# Patient Record
Sex: Female | Born: 1935 | Race: White | Hispanic: No | Marital: Married | Smoking: Former smoker
Health system: Southern US, Community
[De-identification: ages and names within clinical notes are randomized; demographics above are authoritative.]

## PROBLEM LIST (undated history)

## (undated) DIAGNOSIS — M069 Rheumatoid arthritis, unspecified: Secondary | ICD-10-CM

## (undated) DIAGNOSIS — F32A Depression, unspecified: Secondary | ICD-10-CM

## (undated) DIAGNOSIS — K3184 Gastroparesis: Secondary | ICD-10-CM

## (undated) DIAGNOSIS — C50919 Malignant neoplasm of unspecified site of unspecified female breast: Secondary | ICD-10-CM

## (undated) DIAGNOSIS — I499 Cardiac arrhythmia, unspecified: Secondary | ICD-10-CM

## (undated) DIAGNOSIS — I609 Nontraumatic subarachnoid hemorrhage, unspecified: Secondary | ICD-10-CM

## (undated) DIAGNOSIS — M810 Age-related osteoporosis without current pathological fracture: Secondary | ICD-10-CM

## (undated) DIAGNOSIS — H8109 Meniere's disease, unspecified ear: Secondary | ICD-10-CM

## (undated) DIAGNOSIS — D696 Thrombocytopenia, unspecified: Secondary | ICD-10-CM

## (undated) DIAGNOSIS — I619 Nontraumatic intracerebral hemorrhage, unspecified: Secondary | ICD-10-CM

## (undated) DIAGNOSIS — C189 Malignant neoplasm of colon, unspecified: Secondary | ICD-10-CM

## (undated) DIAGNOSIS — M1990 Unspecified osteoarthritis, unspecified site: Secondary | ICD-10-CM

## (undated) DIAGNOSIS — N952 Postmenopausal atrophic vaginitis: Secondary | ICD-10-CM

## (undated) DIAGNOSIS — I2699 Other pulmonary embolism without acute cor pulmonale: Secondary | ICD-10-CM

## (undated) DIAGNOSIS — R251 Tremor, unspecified: Secondary | ICD-10-CM

## (undated) DIAGNOSIS — I890 Lymphedema, not elsewhere classified: Secondary | ICD-10-CM

## (undated) DIAGNOSIS — I10 Essential (primary) hypertension: Secondary | ICD-10-CM

## (undated) DIAGNOSIS — R198 Other specified symptoms and signs involving the digestive system and abdomen: Secondary | ICD-10-CM

## (undated) DIAGNOSIS — G8110 Spastic hemiplegia affecting unspecified side: Secondary | ICD-10-CM

## (undated) DIAGNOSIS — I639 Cerebral infarction, unspecified: Secondary | ICD-10-CM

## (undated) DIAGNOSIS — E039 Hypothyroidism, unspecified: Secondary | ICD-10-CM

## (undated) DIAGNOSIS — F329 Major depressive disorder, single episode, unspecified: Secondary | ICD-10-CM

## (undated) DIAGNOSIS — E042 Nontoxic multinodular goiter: Secondary | ICD-10-CM

## (undated) HISTORY — PX: BREAST SURGERY: SHX581

## (undated) HISTORY — PX: MASTECTOMY: SHX3

## (undated) HISTORY — PX: NASAL SINUS SURGERY: SHX719

## (undated) HISTORY — DX: Postmenopausal atrophic vaginitis: N95.2

## (undated) HISTORY — DX: Gastroparesis: K31.84

## (undated) HISTORY — PX: TOTAL HIP ARTHROPLASTY: SHX124

## (undated) HISTORY — DX: Nontraumatic intracerebral hemorrhage, unspecified: I61.9

## (undated) HISTORY — DX: Nontraumatic subarachnoid hemorrhage, unspecified: I60.9

## (undated) HISTORY — PX: THORACIC OUTLET SURGERY: SHX2502

## (undated) HISTORY — DX: Other pulmonary embolism without acute cor pulmonale: I26.99

## (undated) HISTORY — PX: LUMBAR LAMINECTOMY: SHX95

## (undated) HISTORY — PX: BUNIONECTOMY: SHX129

## (undated) HISTORY — PX: JOINT REPLACEMENT: SHX530

## (undated) HISTORY — PX: CARPAL TUNNEL RELEASE: SHX101

## (undated) HISTORY — PX: CERVICAL LAMINECTOMY: SHX94

## (undated) HISTORY — PX: OTHER SURGICAL HISTORY: SHX169

## (undated) HISTORY — DX: Cerebral infarction, unspecified: I63.9

## (undated) HISTORY — PX: SHOULDER ARTHROSCOPY: SHX128

## (undated) HISTORY — DX: Nontoxic multinodular goiter: E04.2

## (undated) HISTORY — DX: Meniere's disease, unspecified ear: H81.09

## (undated) HISTORY — PX: ULNAR NERVE TRANSPOSITION: SHX2595

## (undated) HISTORY — PX: CHOLECYSTECTOMY: SHX55

## (undated) HISTORY — DX: Spastic hemiplegia affecting unspecified side: G81.10

## (undated) HISTORY — DX: Age-related osteoporosis without current pathological fracture: M81.0

## (undated) HISTORY — DX: Lymphedema, not elsewhere classified: I89.0

## (undated) HISTORY — PX: TONSILLECTOMY: SUR1361

## (undated) HISTORY — PX: THYROIDECTOMY: SHX17

## (undated) HISTORY — DX: Thrombocytopenia, unspecified: D69.6

## (undated) HISTORY — PX: KNEE SURGERY: SHX244

---

## 1997-12-26 ENCOUNTER — Inpatient Hospital Stay (HOSPITAL_COMMUNITY): Admission: EM | Admit: 1997-12-26 | Discharge: 1997-12-29 | Payer: Self-pay | Admitting: Emergency Medicine

## 1998-02-14 ENCOUNTER — Inpatient Hospital Stay (HOSPITAL_COMMUNITY): Admission: EM | Admit: 1998-02-14 | Discharge: 1998-02-17 | Payer: Self-pay | Admitting: Emergency Medicine

## 1998-03-19 ENCOUNTER — Other Ambulatory Visit: Admission: RE | Admit: 1998-03-19 | Discharge: 1998-03-19 | Payer: Self-pay | Admitting: *Deleted

## 1998-09-30 ENCOUNTER — Encounter: Payer: Self-pay | Admitting: Internal Medicine

## 1998-09-30 ENCOUNTER — Inpatient Hospital Stay (HOSPITAL_COMMUNITY): Admission: AD | Admit: 1998-09-30 | Discharge: 1998-10-05 | Payer: Self-pay | Admitting: Internal Medicine

## 1998-11-19 ENCOUNTER — Encounter: Payer: Self-pay | Admitting: Orthopedic Surgery

## 1998-11-22 ENCOUNTER — Inpatient Hospital Stay (HOSPITAL_COMMUNITY): Admission: RE | Admit: 1998-11-22 | Discharge: 1998-11-26 | Payer: Self-pay | Admitting: Orthopedic Surgery

## 1999-03-16 ENCOUNTER — Inpatient Hospital Stay (HOSPITAL_COMMUNITY): Admission: RE | Admit: 1999-03-16 | Discharge: 1999-03-21 | Payer: Self-pay | Admitting: Orthopedic Surgery

## 1999-03-16 ENCOUNTER — Encounter: Payer: Self-pay | Admitting: Orthopedic Surgery

## 1999-04-20 ENCOUNTER — Other Ambulatory Visit: Admission: RE | Admit: 1999-04-20 | Discharge: 1999-04-20 | Payer: Self-pay | Admitting: *Deleted

## 2000-02-09 ENCOUNTER — Inpatient Hospital Stay (HOSPITAL_COMMUNITY): Admission: EM | Admit: 2000-02-09 | Discharge: 2000-02-13 | Payer: Self-pay | Admitting: Emergency Medicine

## 2000-02-10 ENCOUNTER — Encounter: Payer: Self-pay | Admitting: Internal Medicine

## 2000-03-14 ENCOUNTER — Encounter: Payer: Self-pay | Admitting: Orthopedic Surgery

## 2000-03-14 ENCOUNTER — Encounter: Admission: RE | Admit: 2000-03-14 | Discharge: 2000-03-14 | Payer: Self-pay | Admitting: Orthopedic Surgery

## 2000-03-23 ENCOUNTER — Other Ambulatory Visit: Admission: RE | Admit: 2000-03-23 | Discharge: 2000-03-23 | Payer: Self-pay | Admitting: *Deleted

## 2000-05-08 DIAGNOSIS — D696 Thrombocytopenia, unspecified: Secondary | ICD-10-CM

## 2000-05-08 DIAGNOSIS — H8109 Meniere's disease, unspecified ear: Secondary | ICD-10-CM

## 2000-05-08 HISTORY — DX: Meniere's disease, unspecified ear: H81.09

## 2000-05-08 HISTORY — DX: Thrombocytopenia, unspecified: D69.6

## 2000-06-12 ENCOUNTER — Encounter: Admission: RE | Admit: 2000-06-12 | Discharge: 2000-09-10 | Payer: Self-pay | Admitting: Radiation Oncology

## 2000-07-16 ENCOUNTER — Encounter: Payer: Self-pay | Admitting: Orthopedic Surgery

## 2000-07-16 ENCOUNTER — Inpatient Hospital Stay (HOSPITAL_COMMUNITY): Admission: RE | Admit: 2000-07-16 | Discharge: 2000-07-20 | Payer: Self-pay | Admitting: Orthopedic Surgery

## 2001-03-25 ENCOUNTER — Other Ambulatory Visit: Admission: RE | Admit: 2001-03-25 | Discharge: 2001-03-25 | Payer: Self-pay | Admitting: *Deleted

## 2001-04-22 ENCOUNTER — Inpatient Hospital Stay (HOSPITAL_COMMUNITY): Admission: EM | Admit: 2001-04-22 | Discharge: 2001-05-03 | Payer: Self-pay | Admitting: Emergency Medicine

## 2001-04-23 ENCOUNTER — Encounter: Payer: Self-pay | Admitting: Internal Medicine

## 2001-04-26 ENCOUNTER — Encounter: Payer: Self-pay | Admitting: Internal Medicine

## 2001-04-27 ENCOUNTER — Encounter: Payer: Self-pay | Admitting: Internal Medicine

## 2001-04-29 ENCOUNTER — Encounter: Payer: Self-pay | Admitting: Internal Medicine

## 2001-04-30 ENCOUNTER — Encounter: Payer: Self-pay | Admitting: Internal Medicine

## 2001-05-01 ENCOUNTER — Encounter: Payer: Self-pay | Admitting: Internal Medicine

## 2001-05-08 DIAGNOSIS — I2699 Other pulmonary embolism without acute cor pulmonale: Secondary | ICD-10-CM

## 2001-05-08 HISTORY — DX: Other pulmonary embolism without acute cor pulmonale: I26.99

## 2001-11-05 DIAGNOSIS — K3184 Gastroparesis: Secondary | ICD-10-CM

## 2001-11-05 HISTORY — DX: Gastroparesis: K31.84

## 2001-11-22 ENCOUNTER — Encounter: Payer: Self-pay | Admitting: Emergency Medicine

## 2001-11-23 ENCOUNTER — Encounter: Payer: Self-pay | Admitting: Internal Medicine

## 2001-11-23 ENCOUNTER — Inpatient Hospital Stay (HOSPITAL_COMMUNITY): Admission: EM | Admit: 2001-11-23 | Discharge: 2001-11-28 | Payer: Self-pay | Admitting: Emergency Medicine

## 2001-11-24 ENCOUNTER — Encounter: Payer: Self-pay | Admitting: Urology

## 2002-05-06 ENCOUNTER — Inpatient Hospital Stay (HOSPITAL_COMMUNITY): Admission: EM | Admit: 2002-05-06 | Discharge: 2002-05-08 | Payer: Self-pay | Admitting: Internal Medicine

## 2002-05-22 ENCOUNTER — Other Ambulatory Visit: Admission: RE | Admit: 2002-05-22 | Discharge: 2002-05-22 | Payer: Self-pay | Admitting: *Deleted

## 2002-11-27 ENCOUNTER — Encounter: Payer: Self-pay | Admitting: Orthopedic Surgery

## 2002-11-27 ENCOUNTER — Encounter: Admission: RE | Admit: 2002-11-27 | Discharge: 2002-11-27 | Payer: Self-pay | Admitting: Orthopedic Surgery

## 2002-12-01 ENCOUNTER — Ambulatory Visit (HOSPITAL_COMMUNITY): Admission: RE | Admit: 2002-12-01 | Discharge: 2002-12-02 | Payer: Self-pay | Admitting: Orthopedic Surgery

## 2003-05-29 ENCOUNTER — Other Ambulatory Visit: Admission: RE | Admit: 2003-05-29 | Discharge: 2003-05-29 | Payer: Self-pay | Admitting: *Deleted

## 2003-07-27 ENCOUNTER — Other Ambulatory Visit: Admission: RE | Admit: 2003-07-27 | Discharge: 2003-07-27 | Payer: Self-pay | Admitting: Radiology

## 2003-08-04 ENCOUNTER — Encounter (HOSPITAL_COMMUNITY): Admission: RE | Admit: 2003-08-04 | Discharge: 2003-11-02 | Payer: Self-pay | Admitting: General Surgery

## 2003-08-05 ENCOUNTER — Encounter (INDEPENDENT_AMBULATORY_CARE_PROVIDER_SITE_OTHER): Payer: Self-pay | Admitting: *Deleted

## 2003-08-06 ENCOUNTER — Inpatient Hospital Stay (HOSPITAL_COMMUNITY): Admission: RE | Admit: 2003-08-06 | Discharge: 2003-08-07 | Payer: Self-pay | Admitting: General Surgery

## 2003-09-07 ENCOUNTER — Ambulatory Visit (HOSPITAL_COMMUNITY): Admission: RE | Admit: 2003-09-07 | Discharge: 2003-09-07 | Payer: Self-pay | Admitting: Oncology

## 2003-09-10 ENCOUNTER — Encounter: Admission: RE | Admit: 2003-09-10 | Discharge: 2003-09-10 | Payer: Self-pay | Admitting: Oncology

## 2003-09-22 ENCOUNTER — Ambulatory Visit (HOSPITAL_COMMUNITY): Admission: RE | Admit: 2003-09-22 | Discharge: 2003-09-22 | Payer: Self-pay | Admitting: Oncology

## 2004-03-17 ENCOUNTER — Encounter: Admission: RE | Admit: 2004-03-17 | Discharge: 2004-03-17 | Payer: Self-pay | Admitting: Otolaryngology

## 2004-04-28 ENCOUNTER — Ambulatory Visit: Payer: Self-pay | Admitting: Oncology

## 2004-09-09 ENCOUNTER — Ambulatory Visit: Payer: Self-pay | Admitting: Oncology

## 2004-12-22 ENCOUNTER — Encounter: Admission: RE | Admit: 2004-12-22 | Discharge: 2004-12-22 | Payer: Self-pay | Admitting: Orthopedic Surgery

## 2005-01-03 ENCOUNTER — Ambulatory Visit: Payer: Self-pay | Admitting: Oncology

## 2005-03-06 ENCOUNTER — Encounter: Admission: RE | Admit: 2005-03-06 | Discharge: 2005-03-06 | Payer: Self-pay | Admitting: Orthopedic Surgery

## 2005-04-04 ENCOUNTER — Ambulatory Visit (HOSPITAL_COMMUNITY): Admission: RE | Admit: 2005-04-04 | Discharge: 2005-04-04 | Payer: Self-pay | Admitting: Anesthesiology

## 2005-04-05 ENCOUNTER — Inpatient Hospital Stay (HOSPITAL_COMMUNITY): Admission: RE | Admit: 2005-04-05 | Discharge: 2005-04-08 | Payer: Self-pay | Admitting: Orthopedic Surgery

## 2005-04-10 ENCOUNTER — Emergency Department (HOSPITAL_COMMUNITY): Admission: EM | Admit: 2005-04-10 | Discharge: 2005-04-10 | Payer: Self-pay | Admitting: *Deleted

## 2005-05-08 DIAGNOSIS — C189 Malignant neoplasm of colon, unspecified: Secondary | ICD-10-CM

## 2005-05-08 HISTORY — DX: Malignant neoplasm of colon, unspecified: C18.9

## 2005-05-12 ENCOUNTER — Ambulatory Visit: Payer: Self-pay | Admitting: Oncology

## 2005-05-31 ENCOUNTER — Other Ambulatory Visit: Admission: RE | Admit: 2005-05-31 | Discharge: 2005-05-31 | Payer: Self-pay | Admitting: Obstetrics & Gynecology

## 2005-06-20 ENCOUNTER — Encounter: Admission: RE | Admit: 2005-06-20 | Discharge: 2005-06-20 | Payer: Self-pay | Admitting: Internal Medicine

## 2005-07-04 ENCOUNTER — Inpatient Hospital Stay (HOSPITAL_COMMUNITY): Admission: RE | Admit: 2005-07-04 | Discharge: 2005-07-11 | Payer: Self-pay | Admitting: Surgery

## 2005-07-04 ENCOUNTER — Encounter (INDEPENDENT_AMBULATORY_CARE_PROVIDER_SITE_OTHER): Payer: Self-pay | Admitting: Specialist

## 2005-07-24 ENCOUNTER — Ambulatory Visit: Payer: Self-pay | Admitting: Oncology

## 2005-08-03 ENCOUNTER — Ambulatory Visit (HOSPITAL_BASED_OUTPATIENT_CLINIC_OR_DEPARTMENT_OTHER): Admission: RE | Admit: 2005-08-03 | Discharge: 2005-08-03 | Payer: Self-pay | Admitting: Obstetrics and Gynecology

## 2005-08-14 LAB — BASIC METABOLIC PANEL
CO2: 28 mEq/L (ref 19–32)
Calcium: 8.7 mg/dL (ref 8.4–10.5)
Sodium: 142 mEq/L (ref 135–145)

## 2005-08-14 LAB — CBC WITH DIFFERENTIAL/PLATELET
BASO%: 1.2 % (ref 0.0–2.0)
LYMPH%: 16.3 % (ref 14.0–48.0)
MCHC: 32.6 g/dL (ref 32.0–36.0)
MONO#: 0.6 10*3/uL (ref 0.1–0.9)
MONO%: 9.4 % (ref 0.0–13.0)
Platelets: 181 10*3/uL (ref 145–400)
RBC: 3.77 10*6/uL (ref 3.70–5.32)
RDW: 16 % — ABNORMAL HIGH (ref 11.3–14.5)
WBC: 6.5 10*3/uL (ref 3.9–10.0)

## 2005-08-21 LAB — BASIC METABOLIC PANEL
Calcium: 9.1 mg/dL (ref 8.4–10.5)
Potassium: 4.4 mEq/L (ref 3.5–5.3)
Sodium: 143 mEq/L (ref 135–145)

## 2005-08-21 LAB — CBC WITH DIFFERENTIAL/PLATELET
BASO%: 0.4 % (ref 0.0–2.0)
EOS%: 2.6 % (ref 0.0–7.0)
Eosinophils Absolute: 0.1 10*3/uL (ref 0.0–0.5)
LYMPH%: 25.3 % (ref 14.0–48.0)
MCH: 28.9 pg (ref 26.0–34.0)
MCHC: 33.3 g/dL (ref 32.0–36.0)
MCV: 86.9 fL (ref 81.0–101.0)
MONO%: 5.2 % (ref 0.0–13.0)
NEUT#: 2.2 10*3/uL (ref 1.5–6.5)
Platelets: 145 10*3/uL (ref 145–400)
RBC: 3.72 10*6/uL (ref 3.70–5.32)
RDW: 17.6 % — ABNORMAL HIGH (ref 11.3–14.5)

## 2005-08-28 LAB — CBC WITH DIFFERENTIAL/PLATELET
BASO%: 1.3 % (ref 0.0–2.0)
EOS%: 4.7 % (ref 0.0–7.0)
LYMPH%: 34.4 % (ref 14.0–48.0)
MCH: 29 pg (ref 26.0–34.0)
MCHC: 33.4 g/dL (ref 32.0–36.0)
MCV: 86.7 fL (ref 81.0–101.0)
MONO%: 11 % (ref 0.0–13.0)
Platelets: 124 10*3/uL — ABNORMAL LOW (ref 145–400)
RBC: 3.64 10*6/uL — ABNORMAL LOW (ref 3.70–5.32)

## 2005-08-28 LAB — CEA: CEA: 1.9 ng/mL (ref 0.0–5.0)

## 2005-08-28 LAB — BASIC METABOLIC PANEL
CO2: 28 mEq/L (ref 19–32)
Calcium: 8.5 mg/dL (ref 8.4–10.5)
Creatinine, Ser: 0.8 mg/dL (ref 0.4–1.2)
Glucose, Bld: 116 mg/dL — ABNORMAL HIGH (ref 70–99)

## 2005-09-04 LAB — CBC WITH DIFFERENTIAL/PLATELET
BASO%: 0.7 % (ref 0.0–2.0)
LYMPH%: 33.2 % (ref 14.0–48.0)
MCHC: 33 g/dL (ref 32.0–36.0)
MONO#: 0.2 10*3/uL (ref 0.1–0.9)
Platelets: 174 10*3/uL (ref 145–400)
RBC: 3.73 10*6/uL (ref 3.70–5.32)
RDW: 18.2 % — ABNORMAL HIGH (ref 11.3–14.5)
WBC: 2.6 10*3/uL — ABNORMAL LOW (ref 3.9–10.0)
lymph#: 0.9 10*3/uL (ref 0.9–3.3)

## 2005-09-04 LAB — BASIC METABOLIC PANEL
CO2: 30 mEq/L (ref 19–32)
Calcium: 9 mg/dL (ref 8.4–10.5)
Sodium: 143 mEq/L (ref 135–145)

## 2005-09-06 ENCOUNTER — Encounter: Payer: Self-pay | Admitting: Emergency Medicine

## 2005-09-06 LAB — CLOSTRIDIUM DIFFICILE EIA

## 2005-09-10 ENCOUNTER — Ambulatory Visit: Payer: Self-pay | Admitting: Oncology

## 2005-09-11 LAB — CBC WITH DIFFERENTIAL/PLATELET
Basophils Absolute: 0.1 10*3/uL (ref 0.0–0.1)
Eosinophils Absolute: 0.1 10*3/uL (ref 0.0–0.5)
HCT: 33.3 % — ABNORMAL LOW (ref 34.8–46.6)
HGB: 11 g/dL — ABNORMAL LOW (ref 11.6–15.9)
MCV: 86.6 fL (ref 81.0–101.0)
MONO%: 13.1 % — ABNORMAL HIGH (ref 0.0–13.0)
NEUT#: 3.1 10*3/uL (ref 1.5–6.5)
NEUT%: 63.2 % (ref 39.6–76.8)
RDW: 17.1 % — ABNORMAL HIGH (ref 11.3–14.5)
lymph#: 1 10*3/uL (ref 0.9–3.3)

## 2005-09-11 LAB — BASIC METABOLIC PANEL WITH GFR
BUN: 11 mg/dL (ref 6–23)
CO2: 27 meq/L (ref 19–32)
Calcium: 8.6 mg/dL (ref 8.4–10.5)
Chloride: 101 meq/L (ref 96–112)
Creatinine, Ser: 0.7 mg/dL (ref 0.4–1.2)
Glucose, Bld: 110 mg/dL — ABNORMAL HIGH (ref 70–99)
Potassium: 4 meq/L (ref 3.5–5.3)
Sodium: 138 meq/L (ref 135–145)

## 2005-09-18 LAB — CBC WITH DIFFERENTIAL/PLATELET
BASO%: 1.4 % (ref 0.0–2.0)
EOS%: 3.8 % (ref 0.0–7.0)
LYMPH%: 33.9 % (ref 14.0–48.0)
MCH: 30 pg (ref 26.0–34.0)
MCHC: 34.1 g/dL (ref 32.0–36.0)
MCV: 88.2 fL (ref 81.0–101.0)
MONO%: 11.6 % (ref 0.0–13.0)
Platelets: 193 10*3/uL (ref 145–400)
RBC: 3.91 10*6/uL (ref 3.70–5.32)

## 2005-09-18 LAB — BASIC METABOLIC PANEL
Calcium: 9 mg/dL (ref 8.4–10.5)
Glucose, Bld: 124 mg/dL — ABNORMAL HIGH (ref 70–99)
Sodium: 143 mEq/L (ref 135–145)

## 2005-09-18 LAB — CANCER ANTIGEN 27.29: CA 27.29: 29 U/mL (ref 0–39)

## 2005-09-25 LAB — CBC WITH DIFFERENTIAL/PLATELET
Basophils Absolute: 0 10*3/uL (ref 0.0–0.1)
EOS%: 3 % (ref 0.0–7.0)
Eosinophils Absolute: 0.2 10*3/uL (ref 0.0–0.5)
HCT: 32.4 % — ABNORMAL LOW (ref 34.8–46.6)
HGB: 10.7 g/dL — ABNORMAL LOW (ref 11.6–15.9)
MCH: 28.9 pg (ref 26.0–34.0)
MCV: 87.6 fL (ref 81.0–101.0)
MONO%: 11.7 % (ref 0.0–13.0)
NEUT#: 3.6 10*3/uL (ref 1.5–6.5)
NEUT%: 65.3 % (ref 39.6–76.8)
RDW: 18.5 % — ABNORMAL HIGH (ref 11.3–14.5)

## 2005-09-25 LAB — BASIC METABOLIC PANEL
BUN: 15 mg/dL (ref 6–23)
Chloride: 102 mEq/L (ref 96–112)
Creatinine, Ser: 0.9 mg/dL (ref 0.4–1.2)
Glucose, Bld: 109 mg/dL — ABNORMAL HIGH (ref 70–99)
Potassium: 4.3 mEq/L (ref 3.5–5.3)

## 2005-10-03 ENCOUNTER — Ambulatory Visit (HOSPITAL_COMMUNITY): Admission: RE | Admit: 2005-10-03 | Discharge: 2005-10-03 | Payer: Self-pay | Admitting: Oncology

## 2005-10-03 LAB — IRON AND TIBC
Iron: 17 ug/dL — ABNORMAL LOW (ref 42–145)
TIBC: 393 ug/dL (ref 250–470)
UIBC: 376 ug/dL

## 2005-10-03 LAB — CBC WITH DIFFERENTIAL/PLATELET
BASO%: 0.4 % (ref 0.0–2.0)
EOS%: 2.8 % (ref 0.0–7.0)
LYMPH%: 18.1 % (ref 14.0–48.0)
MCHC: 32.8 g/dL (ref 32.0–36.0)
MCV: 86.9 fL (ref 81.0–101.0)
MONO%: 11.9 % (ref 0.0–13.0)
NEUT#: 3.3 10*3/uL (ref 1.5–6.5)
Platelets: 174 10*3/uL (ref 145–400)
RBC: 3.49 10*6/uL — ABNORMAL LOW (ref 3.70–5.32)
RDW: 18.6 % — ABNORMAL HIGH (ref 11.3–14.5)

## 2005-10-03 LAB — BASIC METABOLIC PANEL
Calcium: 8.7 mg/dL (ref 8.4–10.5)
Potassium: 4.2 mEq/L (ref 3.5–5.3)
Sodium: 142 mEq/L (ref 135–145)

## 2005-10-03 LAB — RETICULOCYTES: RETIC #: 71.8 10*3/uL (ref 19.7–115.1)

## 2005-10-10 LAB — CBC WITH DIFFERENTIAL/PLATELET
Eosinophils Absolute: 0.1 10*3/uL (ref 0.0–0.5)
LYMPH%: 17.7 % (ref 14.0–48.0)
MCV: 87.6 fL (ref 81.0–101.0)
MONO%: 7.5 % (ref 0.0–13.0)
NEUT#: 5 10*3/uL (ref 1.5–6.5)
Platelets: 168 10*3/uL (ref 145–400)
RBC: 3.86 10*6/uL (ref 3.70–5.32)

## 2005-10-23 LAB — CBC WITH DIFFERENTIAL/PLATELET
BASO%: 0.8 % (ref 0.0–2.0)
EOS%: 3.6 % (ref 0.0–7.0)
LYMPH%: 24.4 % (ref 14.0–48.0)
MCHC: 31 g/dL — ABNORMAL LOW (ref 32.0–36.0)
MCV: 90.9 fL (ref 81.0–101.0)
MONO%: 10.9 % (ref 0.0–13.0)
Platelets: 176 10*3/uL (ref 145–400)
RBC: 4.42 10*6/uL (ref 3.70–5.32)
WBC: 4.8 10*3/uL (ref 3.9–10.0)

## 2005-10-31 ENCOUNTER — Ambulatory Visit: Payer: Self-pay | Admitting: Oncology

## 2005-11-06 LAB — CBC WITH DIFFERENTIAL/PLATELET
BASO%: 1.1 % (ref 0.0–2.0)
EOS%: 2.5 % (ref 0.0–7.0)
HCT: 42.2 % (ref 34.8–46.6)
HGB: 13.3 g/dL (ref 11.6–15.9)
MCH: 28.8 pg (ref 26.0–34.0)
MCHC: 31.5 g/dL — ABNORMAL LOW (ref 32.0–36.0)
MONO#: 0.8 10*3/uL (ref 0.1–0.9)
RDW: 17 % — ABNORMAL HIGH (ref 11.3–14.5)
WBC: 7.3 10*3/uL (ref 3.9–10.0)
lymph#: 1.4 10*3/uL (ref 0.9–3.3)

## 2005-11-13 LAB — COMPREHENSIVE METABOLIC PANEL
Albumin: 4 g/dL (ref 3.5–5.2)
Alkaline Phosphatase: 52 U/L (ref 39–117)
CO2: 29 mEq/L (ref 19–32)
Calcium: 8.4 mg/dL (ref 8.4–10.5)
Chloride: 102 mEq/L (ref 96–112)
Glucose, Bld: 109 mg/dL — ABNORMAL HIGH (ref 70–99)
Potassium: 4 mEq/L (ref 3.5–5.3)
Sodium: 143 mEq/L (ref 135–145)
Total Protein: 6.1 g/dL (ref 6.0–8.3)

## 2005-11-13 LAB — CBC WITH DIFFERENTIAL/PLATELET
Basophils Absolute: 0 10*3/uL (ref 0.0–0.1)
Eosinophils Absolute: 0.1 10*3/uL (ref 0.0–0.5)
HCT: 38.4 % (ref 34.8–46.6)
HGB: 12.7 g/dL (ref 11.6–15.9)
MONO#: 0.4 10*3/uL (ref 0.1–0.9)
NEUT#: 3.4 10*3/uL (ref 1.5–6.5)
NEUT%: 67.7 % (ref 39.6–76.8)
RDW: 19.4 % — ABNORMAL HIGH (ref 11.3–14.5)
lymph#: 1.1 10*3/uL (ref 0.9–3.3)

## 2005-11-13 LAB — LACTATE DEHYDROGENASE: LDH: 193 U/L (ref 94–250)

## 2005-11-23 LAB — CBC WITH DIFFERENTIAL/PLATELET
Basophils Absolute: 0 10*3/uL (ref 0.0–0.1)
Eosinophils Absolute: 0.1 10*3/uL (ref 0.0–0.5)
HCT: 37.8 % (ref 34.8–46.6)
HGB: 12.5 g/dL (ref 11.6–15.9)
LYMPH%: 19.1 % (ref 14.0–48.0)
MCV: 89.6 fL (ref 81.0–101.0)
MONO%: 6.3 % (ref 0.0–13.0)
NEUT#: 4 10*3/uL (ref 1.5–6.5)
NEUT%: 73.1 % (ref 39.6–76.8)
Platelets: 117 10*3/uL — ABNORMAL LOW (ref 145–400)
RDW: 19.5 % — ABNORMAL HIGH (ref 11.3–14.5)

## 2005-12-29 ENCOUNTER — Ambulatory Visit (HOSPITAL_COMMUNITY): Admission: RE | Admit: 2005-12-29 | Discharge: 2005-12-29 | Payer: Self-pay | Admitting: Internal Medicine

## 2006-02-13 ENCOUNTER — Ambulatory Visit: Payer: Self-pay | Admitting: Oncology

## 2006-02-15 LAB — COMPREHENSIVE METABOLIC PANEL
BUN: 10 mg/dL (ref 6–23)
CO2: 28 mEq/L (ref 19–32)
Calcium: 8.4 mg/dL (ref 8.4–10.5)
Chloride: 103 mEq/L (ref 96–112)
Creatinine, Ser: 0.81 mg/dL (ref 0.40–1.20)
Glucose, Bld: 128 mg/dL — ABNORMAL HIGH (ref 70–99)
Total Bilirubin: 0.6 mg/dL (ref 0.3–1.2)

## 2006-02-15 LAB — CBC WITH DIFFERENTIAL/PLATELET
Basophils Absolute: 0 10*3/uL (ref 0.0–0.1)
Eosinophils Absolute: 0 10*3/uL (ref 0.0–0.5)
HCT: 35.7 % (ref 34.8–46.6)
HGB: 12.2 g/dL (ref 11.6–15.9)
LYMPH%: 16.7 % (ref 14.0–48.0)
MCHC: 34.2 g/dL (ref 32.0–36.0)
MONO#: 0.5 10*3/uL (ref 0.1–0.9)
NEUT#: 4.2 10*3/uL (ref 1.5–6.5)
NEUT%: 73.9 % (ref 39.6–76.8)
Platelets: 154 10*3/uL (ref 145–400)
WBC: 5.7 10*3/uL (ref 3.9–10.0)

## 2006-02-15 LAB — LACTATE DEHYDROGENASE: LDH: 220 U/L (ref 94–250)

## 2006-02-15 LAB — CEA: CEA: 1.9 ng/mL (ref 0.0–5.0)

## 2006-02-19 ENCOUNTER — Ambulatory Visit (HOSPITAL_COMMUNITY): Admission: RE | Admit: 2006-02-19 | Discharge: 2006-02-19 | Payer: Self-pay | Admitting: Oncology

## 2006-03-12 ENCOUNTER — Ambulatory Visit (HOSPITAL_BASED_OUTPATIENT_CLINIC_OR_DEPARTMENT_OTHER): Admission: RE | Admit: 2006-03-12 | Discharge: 2006-03-12 | Payer: Self-pay | Admitting: Surgery

## 2006-06-18 ENCOUNTER — Ambulatory Visit: Payer: Self-pay | Admitting: Oncology

## 2006-06-21 LAB — COMPREHENSIVE METABOLIC PANEL
ALT: 79 U/L — ABNORMAL HIGH (ref 0–35)
CO2: 30 mEq/L (ref 19–32)
Calcium: 9 mg/dL (ref 8.4–10.5)
Chloride: 101 mEq/L (ref 96–112)
Creatinine, Ser: 0.9 mg/dL (ref 0.40–1.20)
Glucose, Bld: 134 mg/dL — ABNORMAL HIGH (ref 70–99)
Total Bilirubin: 0.6 mg/dL (ref 0.3–1.2)
Total Protein: 6.5 g/dL (ref 6.0–8.3)

## 2006-06-21 LAB — CBC WITH DIFFERENTIAL/PLATELET
BASO%: 0.2 % (ref 0.0–2.0)
Eosinophils Absolute: 0 10*3/uL (ref 0.0–0.5)
HCT: 38.2 % (ref 34.8–46.6)
HGB: 13.4 g/dL (ref 11.6–15.9)
LYMPH%: 17.5 % (ref 14.0–48.0)
MONO#: 0.5 10*3/uL (ref 0.1–0.9)
NEUT#: 6.1 10*3/uL (ref 1.5–6.5)
NEUT%: 76.3 % (ref 39.6–76.8)
Platelets: 142 10*3/uL — ABNORMAL LOW (ref 145–400)
WBC: 7.9 10*3/uL (ref 3.9–10.0)
lymph#: 1.4 10*3/uL (ref 0.9–3.3)

## 2006-06-21 LAB — CEA: CEA: 2.4 ng/mL (ref 0.0–5.0)

## 2006-06-21 LAB — LACTATE DEHYDROGENASE: LDH: 222 U/L (ref 94–250)

## 2006-06-21 LAB — CANCER ANTIGEN 27.29: CA 27.29: 20 U/mL (ref 0–39)

## 2006-06-26 ENCOUNTER — Ambulatory Visit (HOSPITAL_COMMUNITY): Admission: RE | Admit: 2006-06-26 | Discharge: 2006-06-26 | Payer: Self-pay | Admitting: Oncology

## 2006-09-04 ENCOUNTER — Ambulatory Visit: Payer: Self-pay | Admitting: Oncology

## 2006-09-07 LAB — COMPREHENSIVE METABOLIC PANEL
ALT: 85 U/L — ABNORMAL HIGH (ref 0–35)
BUN: 12 mg/dL (ref 6–23)
CO2: 30 mEq/L (ref 19–32)
Calcium: 8.5 mg/dL (ref 8.4–10.5)
Chloride: 102 mEq/L (ref 96–112)
Creatinine, Ser: 0.82 mg/dL (ref 0.40–1.20)

## 2006-09-07 LAB — CBC WITH DIFFERENTIAL/PLATELET
Basophils Absolute: 0.1 10*3/uL (ref 0.0–0.1)
Eosinophils Absolute: 0.1 10*3/uL (ref 0.0–0.5)
HCT: 37.1 % (ref 34.8–46.6)
HGB: 13.3 g/dL (ref 11.6–15.9)
MONO#: 0.5 10*3/uL (ref 0.1–0.9)
NEUT%: 77.8 % — ABNORMAL HIGH (ref 39.6–76.8)
WBC: 7.8 10*3/uL (ref 3.9–10.0)
lymph#: 1.1 10*3/uL (ref 0.9–3.3)

## 2006-10-11 ENCOUNTER — Encounter: Admission: RE | Admit: 2006-10-11 | Discharge: 2006-10-11 | Payer: Self-pay | Admitting: General Surgery

## 2006-10-16 LAB — CHCC SMEAR

## 2006-10-16 LAB — CBC WITH DIFFERENTIAL/PLATELET
BASO%: 0.3 % (ref 0.0–2.0)
HCT: 38.4 % (ref 34.8–46.6)
HGB: 13.6 g/dL (ref 11.6–15.9)
MCHC: 35.4 g/dL (ref 32.0–36.0)
MONO#: 0.5 10*3/uL (ref 0.1–0.9)
NEUT%: 74.2 % (ref 39.6–76.8)
WBC: 7.1 10*3/uL (ref 3.9–10.0)
lymph#: 1.2 10*3/uL (ref 0.9–3.3)

## 2006-10-16 LAB — MORPHOLOGY: PLT EST: ADEQUATE

## 2006-10-16 LAB — HEPATIC FUNCTION PANEL
Alkaline Phosphatase: 64 U/L (ref 39–117)
Bilirubin, Direct: 0.1 mg/dL (ref 0.0–0.3)
Indirect Bilirubin: 0.4 mg/dL (ref 0.0–0.9)
Total Bilirubin: 0.5 mg/dL (ref 0.3–1.2)
Total Protein: 6.6 g/dL (ref 6.0–8.3)

## 2006-10-16 LAB — GAMMA GT: GGT: 155 U/L — ABNORMAL HIGH (ref 7–51)

## 2006-12-12 ENCOUNTER — Ambulatory Visit: Payer: Self-pay | Admitting: Oncology

## 2006-12-17 LAB — CBC WITH DIFFERENTIAL/PLATELET
Basophils Absolute: 0 10*3/uL (ref 0.0–0.1)
EOS%: 0.8 % (ref 0.0–7.0)
Eosinophils Absolute: 0.1 10*3/uL (ref 0.0–0.5)
HCT: 37 % (ref 34.8–46.6)
HGB: 13.1 g/dL (ref 11.6–15.9)
MCH: 37.7 pg — ABNORMAL HIGH (ref 26.0–34.0)
MCV: 106.5 fL — ABNORMAL HIGH (ref 81.0–101.0)
MONO%: 7.3 % (ref 0.0–13.0)
NEUT#: 5.2 10*3/uL (ref 1.5–6.5)
NEUT%: 72 % (ref 39.6–76.8)
Platelets: 130 10*3/uL — ABNORMAL LOW (ref 145–400)
RDW: 14.6 % — ABNORMAL HIGH (ref 11.3–14.5)

## 2006-12-17 LAB — COMPREHENSIVE METABOLIC PANEL
Albumin: 4.1 g/dL (ref 3.5–5.2)
Alkaline Phosphatase: 59 U/L (ref 39–117)
BUN: 13 mg/dL (ref 6–23)
Calcium: 8.9 mg/dL (ref 8.4–10.5)
Creatinine, Ser: 1.02 mg/dL (ref 0.40–1.20)
Glucose, Bld: 96 mg/dL (ref 70–99)
Potassium: 3.9 mEq/L (ref 3.5–5.3)

## 2006-12-17 LAB — CEA: CEA: 2.1 ng/mL (ref 0.0–5.0)

## 2006-12-17 LAB — LACTATE DEHYDROGENASE: LDH: 253 U/L — ABNORMAL HIGH (ref 94–250)

## 2007-03-07 ENCOUNTER — Ambulatory Visit: Payer: Self-pay | Admitting: Oncology

## 2007-03-07 LAB — FECAL OCCULT BLOOD, GUAIAC: Occult Blood: NEGATIVE

## 2007-03-29 LAB — CBC WITH DIFFERENTIAL/PLATELET
BASO%: 0.4 % (ref 0.0–2.0)
EOS%: 0 % (ref 0.0–7.0)
HCT: 39.8 % (ref 34.8–46.6)
LYMPH%: 10.8 % — ABNORMAL LOW (ref 14.0–48.0)
MCH: 38 pg — ABNORMAL HIGH (ref 26.0–34.0)
MCHC: 34.9 g/dL (ref 32.0–36.0)
NEUT%: 84.9 % — ABNORMAL HIGH (ref 39.6–76.8)
Platelets: 138 10*3/uL — ABNORMAL LOW (ref 145–400)
RBC: 3.66 10*6/uL — ABNORMAL LOW (ref 3.70–5.32)

## 2007-03-29 LAB — COMPREHENSIVE METABOLIC PANEL
ALT: 87 U/L — ABNORMAL HIGH (ref 0–35)
AST: 61 U/L — ABNORMAL HIGH (ref 0–37)
Alkaline Phosphatase: 62 U/L (ref 39–117)
Creatinine, Ser: 0.87 mg/dL (ref 0.40–1.20)
Sodium: 138 mEq/L (ref 135–145)
Total Bilirubin: 0.5 mg/dL (ref 0.3–1.2)

## 2007-03-29 LAB — CANCER ANTIGEN 27.29: CA 27.29: 20 U/mL (ref 0–39)

## 2007-03-29 LAB — LACTATE DEHYDROGENASE: LDH: 294 U/L — ABNORMAL HIGH (ref 94–250)

## 2007-04-01 ENCOUNTER — Ambulatory Visit (HOSPITAL_COMMUNITY): Admission: RE | Admit: 2007-04-01 | Discharge: 2007-04-01 | Payer: Self-pay | Admitting: Oncology

## 2007-04-02 LAB — VITAMIN D PNL(25-HYDRXY+1,25-DIHY)-BLD: Vit D, 1,25-Dihydroxy: 24 pg/mL (ref 6–62)

## 2007-06-11 ENCOUNTER — Other Ambulatory Visit: Admission: RE | Admit: 2007-06-11 | Discharge: 2007-06-11 | Payer: Self-pay | Admitting: Obstetrics and Gynecology

## 2007-10-03 ENCOUNTER — Ambulatory Visit: Payer: Self-pay | Admitting: Oncology

## 2007-10-07 LAB — CBC WITH DIFFERENTIAL/PLATELET
BASO%: 0.4 % (ref 0.0–2.0)
EOS%: 1.1 % (ref 0.0–7.0)
HCT: 37.1 % (ref 34.8–46.6)
LYMPH%: 18 % (ref 14.0–48.0)
MCH: 36.3 pg — ABNORMAL HIGH (ref 26.0–34.0)
MCHC: 34.6 g/dL (ref 32.0–36.0)
MONO#: 0.5 10*3/uL (ref 0.1–0.9)
NEUT%: 73.6 % (ref 39.6–76.8)
Platelets: 154 10*3/uL (ref 145–400)

## 2007-10-08 LAB — COMPREHENSIVE METABOLIC PANEL
Albumin: 4 g/dL (ref 3.5–5.2)
Alkaline Phosphatase: 75 U/L (ref 39–117)
BUN: 9 mg/dL (ref 6–23)
Creatinine, Ser: 0.84 mg/dL (ref 0.40–1.20)
Glucose, Bld: 105 mg/dL — ABNORMAL HIGH (ref 70–99)
Total Bilirubin: 0.5 mg/dL (ref 0.3–1.2)

## 2007-10-08 LAB — CANCER ANTIGEN 27.29: CA 27.29: 27 U/mL (ref 0–39)

## 2007-10-08 LAB — VITAMIN D 25 HYDROXY (VIT D DEFICIENCY, FRACTURES): Vit D, 25-Hydroxy: 33 ng/mL (ref 30–89)

## 2007-11-16 IMAGING — CR DG CHEST 1V PORT
1 series · 1 of 1 positions shown · non-contrast
Comparison: 07/04/2005.

CLINICAL DATA: Port-A-Cath placement.  Cecal mass.  
 PORTABLE CHEST - 1 VIEW AT 2425 HOURS:

[view not recorded]
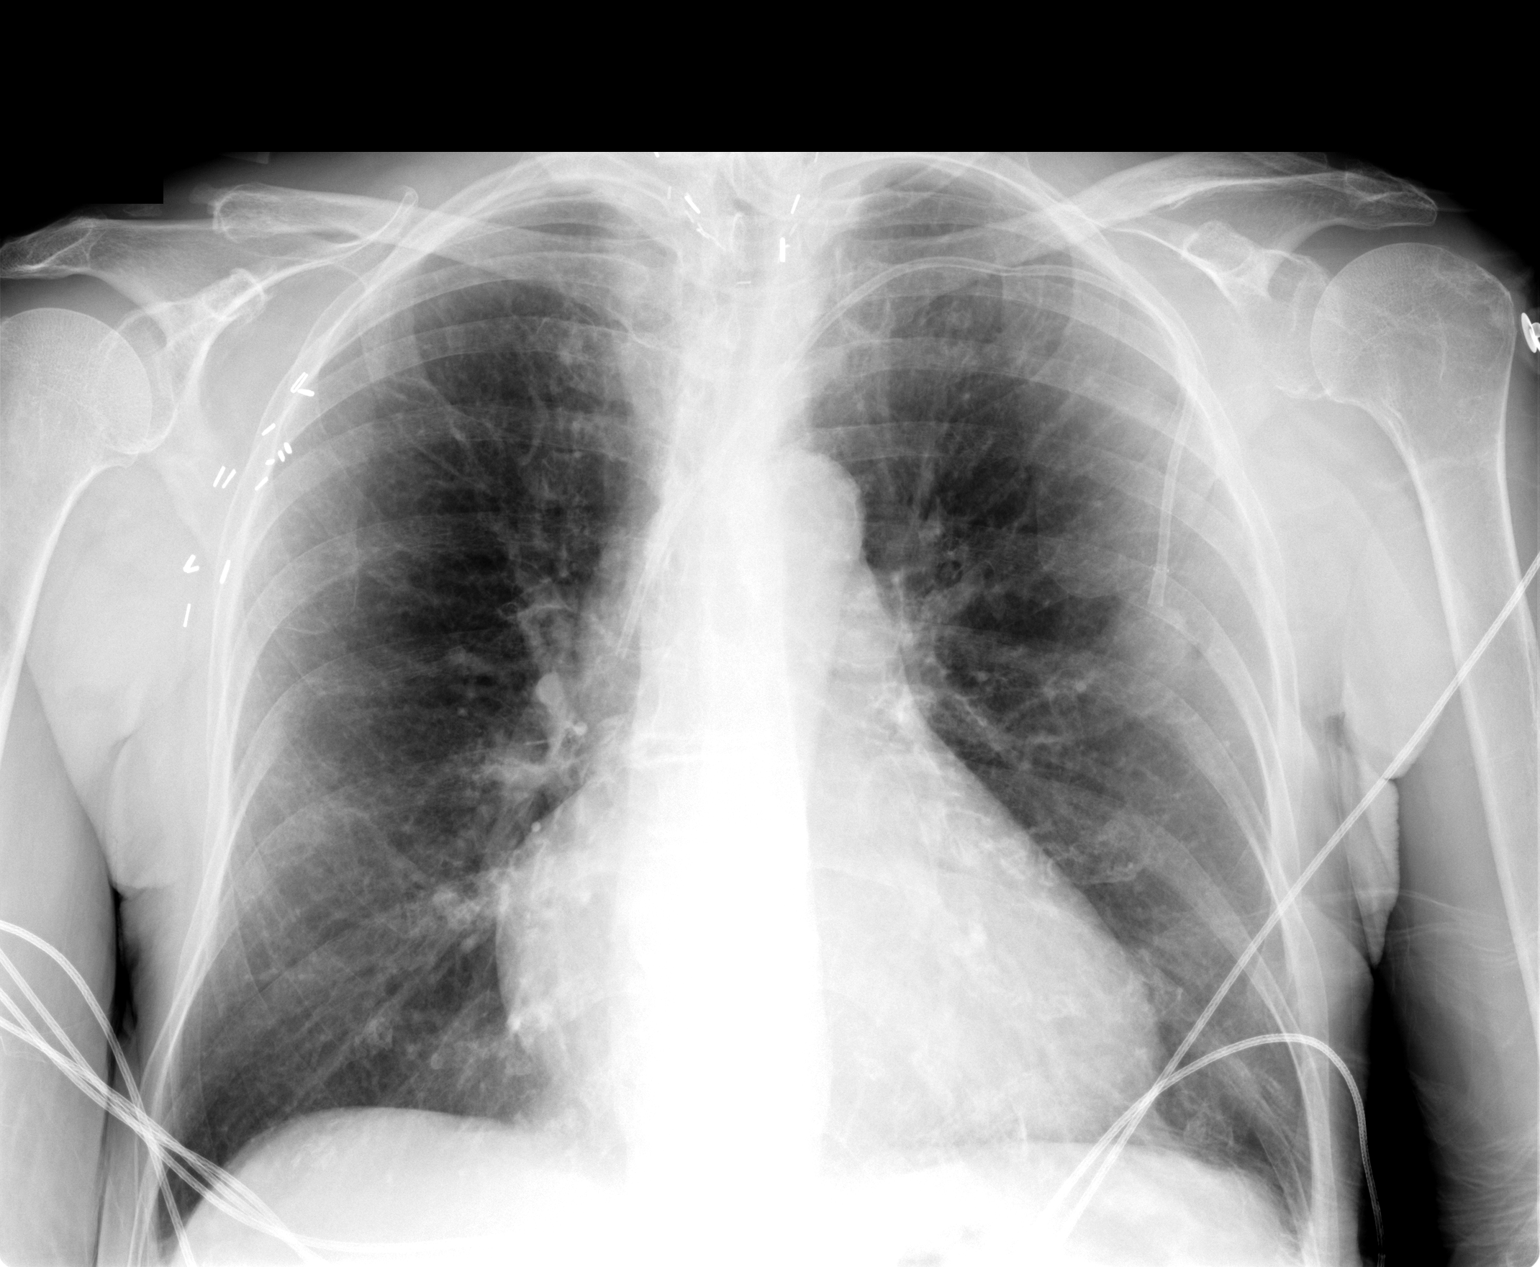

[1 of 1 positions shown; findings below may reference images not displayed]

FINDINGS: There is a new left subclavian Port-A-Cath with its tip in the superior vena cava.   No pneumothorax is demonstrated.  There is stable mild cardiac enlargement with stable mild chronic lung disease.  Surgical clips are present in the right axilla status post apparent mastectomy.  There are additional surgical clips in the lower neck.  Previously noted pneumoperitoneum has resolved.
IMPRESSION: Left subclavian Port-A-Cath tip in the SVC.  No pneumothorax.  No acute findings.

## 2008-01-14 ENCOUNTER — Ambulatory Visit (HOSPITAL_COMMUNITY): Admission: RE | Admit: 2008-01-14 | Discharge: 2008-01-14 | Payer: Self-pay | Admitting: Internal Medicine

## 2008-01-31 ENCOUNTER — Ambulatory Visit: Payer: Self-pay | Admitting: Oncology

## 2008-02-04 LAB — CBC WITH DIFFERENTIAL/PLATELET
BASO%: 0.5 % (ref 0.0–2.0)
EOS%: 0.9 % (ref 0.0–7.0)
HGB: 12.5 g/dL (ref 11.6–15.9)
MCH: 35.3 pg — ABNORMAL HIGH (ref 26.0–34.0)
MCHC: 34.2 g/dL (ref 32.0–36.0)
RDW: 13 % (ref 11.3–14.5)
lymph#: 0.9 10*3/uL (ref 0.9–3.3)

## 2008-02-04 LAB — COMPREHENSIVE METABOLIC PANEL
ALT: 22 U/L (ref 0–35)
AST: 29 U/L (ref 0–37)
Albumin: 3.5 g/dL (ref 3.5–5.2)
Calcium: 8.6 mg/dL (ref 8.4–10.5)
Chloride: 102 mEq/L (ref 96–112)
Potassium: 4 mEq/L (ref 3.5–5.3)

## 2008-02-11 ENCOUNTER — Ambulatory Visit (HOSPITAL_COMMUNITY): Admission: RE | Admit: 2008-02-11 | Discharge: 2008-02-11 | Payer: Self-pay | Admitting: Oncology

## 2008-04-21 ENCOUNTER — Ambulatory Visit: Payer: Self-pay | Admitting: Oncology

## 2008-04-23 LAB — MORPHOLOGY

## 2008-04-23 LAB — CBC & DIFF AND RETIC
Basophils Absolute: 0 10*3/uL (ref 0.0–0.1)
Eosinophils Absolute: 0 10*3/uL (ref 0.0–0.5)
HGB: 13.6 g/dL (ref 11.6–15.9)
IRF: 0.24 (ref 0.130–0.330)
NEUT#: 3.8 10*3/uL (ref 1.5–6.5)
RBC: 3.96 10*6/uL (ref 3.70–5.32)
RDW: 13.8 % (ref 11.3–14.5)
RETIC #: 47.9 10*3/uL (ref 19.7–115.1)
Retic %: 1.2 % (ref 0.4–2.3)
WBC: 5.3 10*3/uL (ref 3.9–10.0)
lymph#: 1 10*3/uL (ref 0.9–3.3)

## 2008-04-23 LAB — CHCC SMEAR

## 2008-04-27 LAB — PROTEIN ELECTROPHORESIS, SERUM
Albumin ELP: 64.8 % (ref 55.8–66.1)
Alpha-1-Globulin: 6 % — ABNORMAL HIGH (ref 2.9–4.9)
Alpha-2-Globulin: 9.3 % (ref 7.1–11.8)
Beta 2: 4.5 % (ref 3.2–6.5)
Beta Globulin: 4.9 % (ref 4.7–7.2)
Gamma Globulin: 10.5 % — ABNORMAL LOW (ref 11.1–18.8)
Total Protein, Serum Electrophoresis: 5.8 g/dL — ABNORMAL LOW (ref 6.0–8.3)

## 2008-04-27 LAB — FERRITIN: Ferritin: 97 ng/mL (ref 10–291)

## 2008-04-27 LAB — VITAMIN B12: Vitamin B-12: 420 pg/mL (ref 211–911)

## 2008-04-27 LAB — FOLATE RBC: RBC Folate: 1103 ng/mL — ABNORMAL HIGH (ref 180–600)

## 2008-05-08 HISTORY — PX: FRACTURE SURGERY: SHX138

## 2008-05-28 ENCOUNTER — Encounter: Admission: RE | Admit: 2008-05-28 | Discharge: 2008-05-28 | Payer: Self-pay | Admitting: Internal Medicine

## 2008-06-01 ENCOUNTER — Encounter: Admission: RE | Admit: 2008-06-01 | Discharge: 2008-06-01 | Payer: Self-pay | Admitting: Internal Medicine

## 2008-06-11 ENCOUNTER — Other Ambulatory Visit: Admission: RE | Admit: 2008-06-11 | Discharge: 2008-06-11 | Payer: Self-pay | Admitting: Obstetrics and Gynecology

## 2008-08-29 ENCOUNTER — Inpatient Hospital Stay (HOSPITAL_COMMUNITY): Admission: EM | Admit: 2008-08-29 | Discharge: 2008-08-31 | Payer: Self-pay | Admitting: Emergency Medicine

## 2008-10-13 ENCOUNTER — Ambulatory Visit: Payer: Self-pay | Admitting: Oncology

## 2008-10-16 LAB — CBC WITH DIFFERENTIAL/PLATELET
BASO%: 0.4 % (ref 0.0–2.0)
Basophils Absolute: 0 10*3/uL (ref 0.0–0.1)
EOS%: 1.4 % (ref 0.0–7.0)
HCT: 36.9 % (ref 34.8–46.6)
HGB: 12.8 g/dL (ref 11.6–15.9)
LYMPH%: 15.1 % (ref 14.0–49.7)
MCH: 35.3 pg — ABNORMAL HIGH (ref 25.1–34.0)
MCHC: 34.8 g/dL (ref 31.5–36.0)
MCV: 101.7 fL — ABNORMAL HIGH (ref 79.5–101.0)
MONO%: 4.8 % (ref 0.0–14.0)
NEUT%: 78.3 % — ABNORMAL HIGH (ref 38.4–76.8)
Platelets: 142 10*3/uL — ABNORMAL LOW (ref 145–400)

## 2008-10-19 LAB — VITAMIN D 25 HYDROXY (VIT D DEFICIENCY, FRACTURES): Vit D, 25-Hydroxy: 25 ng/mL — ABNORMAL LOW (ref 30–89)

## 2008-10-19 LAB — FERRITIN: Ferritin: 35 ng/mL (ref 10–291)

## 2008-10-19 LAB — COMPREHENSIVE METABOLIC PANEL
AST: 28 U/L (ref 0–37)
Alkaline Phosphatase: 84 U/L (ref 39–117)
BUN: 15 mg/dL (ref 6–23)
Calcium: 8.4 mg/dL (ref 8.4–10.5)
Chloride: 105 mEq/L (ref 96–112)
Creatinine, Ser: 0.74 mg/dL (ref 0.40–1.20)
Total Bilirubin: 0.3 mg/dL (ref 0.3–1.2)

## 2008-10-19 LAB — IRON AND TIBC: Iron: 54 ug/dL (ref 42–145)

## 2008-12-03 ENCOUNTER — Ambulatory Visit: Payer: Self-pay | Admitting: Oncology

## 2009-02-17 ENCOUNTER — Ambulatory Visit: Payer: Self-pay | Admitting: Oncology

## 2009-02-19 LAB — CBC WITH DIFFERENTIAL/PLATELET
Basophils Absolute: 0 10*3/uL (ref 0.0–0.1)
EOS%: 1.9 % (ref 0.0–7.0)
Eosinophils Absolute: 0.1 10*3/uL (ref 0.0–0.5)
HCT: 32.6 % — ABNORMAL LOW (ref 34.8–46.6)
HGB: 11 g/dL — ABNORMAL LOW (ref 11.6–15.9)
MCH: 33 pg (ref 25.1–34.0)
MCV: 97.8 fL (ref 79.5–101.0)
MONO%: 7.6 % (ref 0.0–14.0)
NEUT#: 5.7 10*3/uL (ref 1.5–6.5)
NEUT%: 78.4 % — ABNORMAL HIGH (ref 38.4–76.8)
RDW: 16.7 % — ABNORMAL HIGH (ref 11.2–14.5)

## 2009-02-20 LAB — COMPREHENSIVE METABOLIC PANEL
AST: 19 U/L (ref 0–37)
Albumin: 4 g/dL (ref 3.5–5.2)
Alkaline Phosphatase: 157 U/L — ABNORMAL HIGH (ref 39–117)
BUN: 13 mg/dL (ref 6–23)
Calcium: 8.4 mg/dL (ref 8.4–10.5)
Chloride: 102 mEq/L (ref 96–112)
Creatinine, Ser: 0.61 mg/dL (ref 0.40–1.20)
Glucose, Bld: 102 mg/dL — ABNORMAL HIGH (ref 70–99)
Potassium: 4.2 mEq/L (ref 3.5–5.3)

## 2009-02-20 LAB — CANCER ANTIGEN 27.29: CA 27.29: 27 U/mL (ref 0–39)

## 2009-02-20 LAB — VITAMIN D 25 HYDROXY (VIT D DEFICIENCY, FRACTURES): Vit D, 25-Hydroxy: 30 ng/mL (ref 30–89)

## 2009-02-20 LAB — CEA: CEA: 2 ng/mL (ref 0.0–5.0)

## 2009-07-16 ENCOUNTER — Ambulatory Visit (HOSPITAL_BASED_OUTPATIENT_CLINIC_OR_DEPARTMENT_OTHER): Payer: Medicare Other | Admitting: Oncology

## 2009-07-20 LAB — COMPREHENSIVE METABOLIC PANEL
Albumin: 4.5 g/dL (ref 3.5–5.2)
BUN: 9 mg/dL (ref 6–23)
Calcium: 8.8 mg/dL (ref 8.4–10.5)
Chloride: 106 mEq/L (ref 96–112)
Creatinine, Ser: 0.65 mg/dL (ref 0.40–1.20)

## 2009-07-20 LAB — CEA: CEA: 2.7 ng/mL (ref 0.0–5.0)

## 2009-07-20 LAB — CBC WITH DIFFERENTIAL/PLATELET
BASO%: 0.4 % (ref 0.0–2.0)
HCT: 38.6 % (ref 34.8–46.6)
HGB: 12.7 g/dL (ref 11.6–15.9)
MCV: 102.1 fL — ABNORMAL HIGH (ref 79.5–101.0)
MONO#: 0.3 10*3/uL (ref 0.1–0.9)
MONO%: 5.6 % (ref 0.0–14.0)
NEUT#: 3.9 10*3/uL (ref 1.5–6.5)
WBC: 5.2 10*3/uL (ref 3.9–10.3)

## 2009-07-20 LAB — VITAMIN D 25 HYDROXY (VIT D DEFICIENCY, FRACTURES): Vit D, 25-Hydroxy: 28 ng/mL — ABNORMAL LOW (ref 30–89)

## 2009-07-20 LAB — LACTATE DEHYDROGENASE: LDH: 208 U/L (ref 94–250)

## 2009-10-08 ENCOUNTER — Encounter: Admission: RE | Admit: 2009-10-08 | Discharge: 2009-10-08 | Payer: Self-pay | Admitting: Internal Medicine

## 2010-01-20 ENCOUNTER — Encounter: Admission: RE | Admit: 2010-01-20 | Discharge: 2010-01-20 | Payer: Self-pay | Admitting: Oncology

## 2010-04-28 IMAGING — CR DG THORACIC SPINE 2V
3 series · 3 of 3 positions shown · non-contrast
Comparison: Bone scan from today as well as sagittal bone window
images of the thoracolumbar spine from 04/01/2007

CLINICAL DATA: History of breast and colon carcinoma, abnormal
activity on bone scan

THORACIC SPINE - 2 VIEW

[t t-spine a.p.]
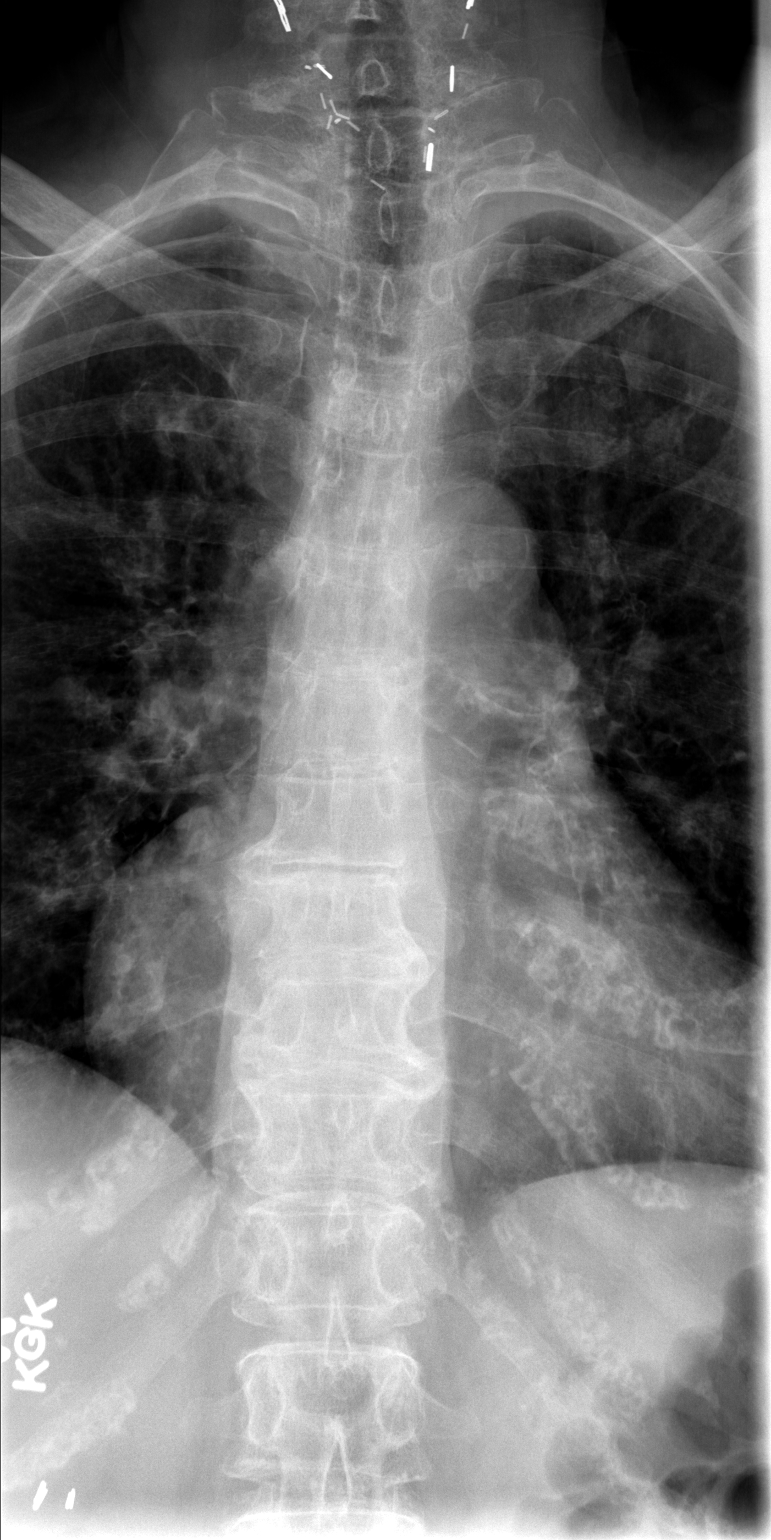

[t t-spine lat]
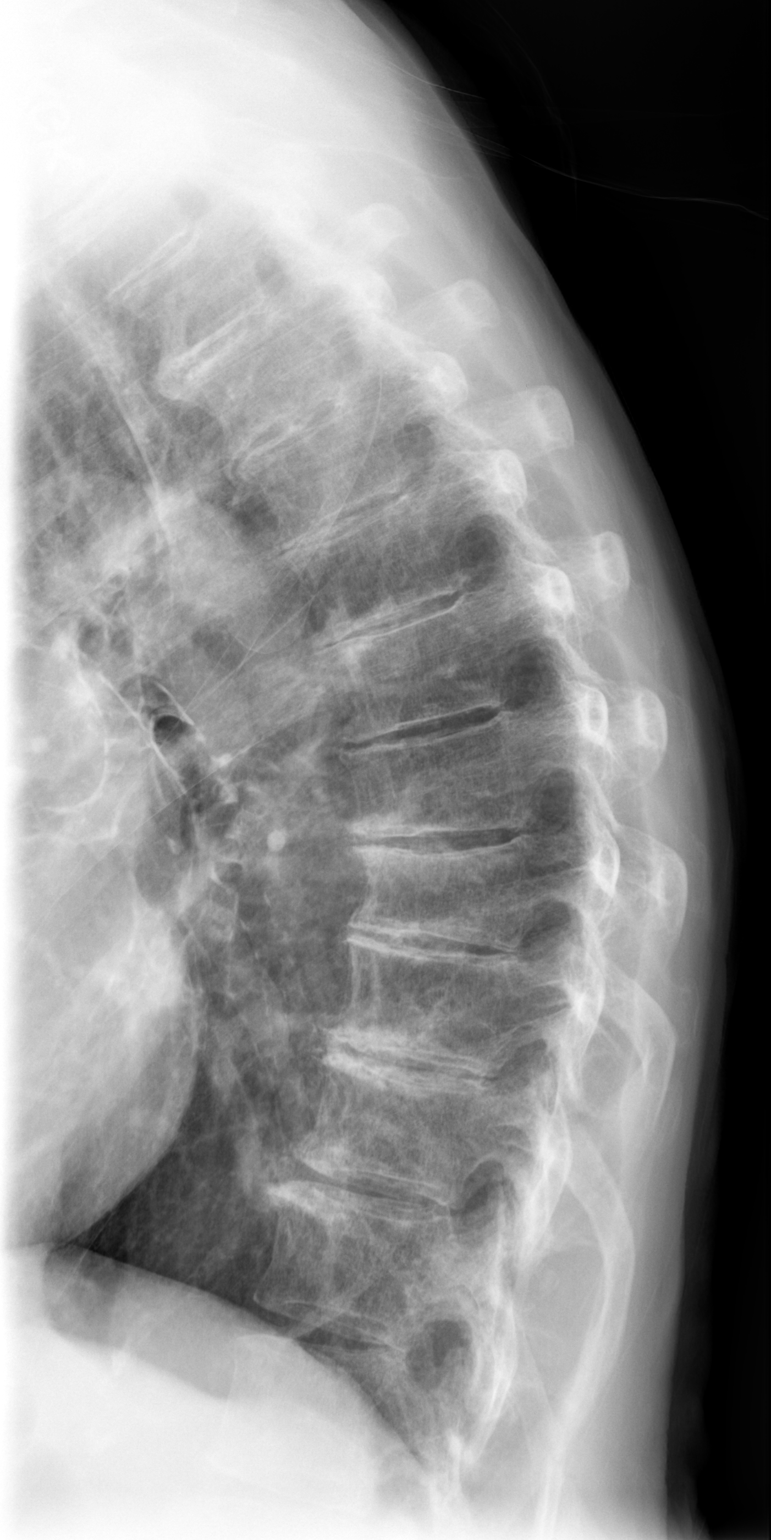

[t swimmers]
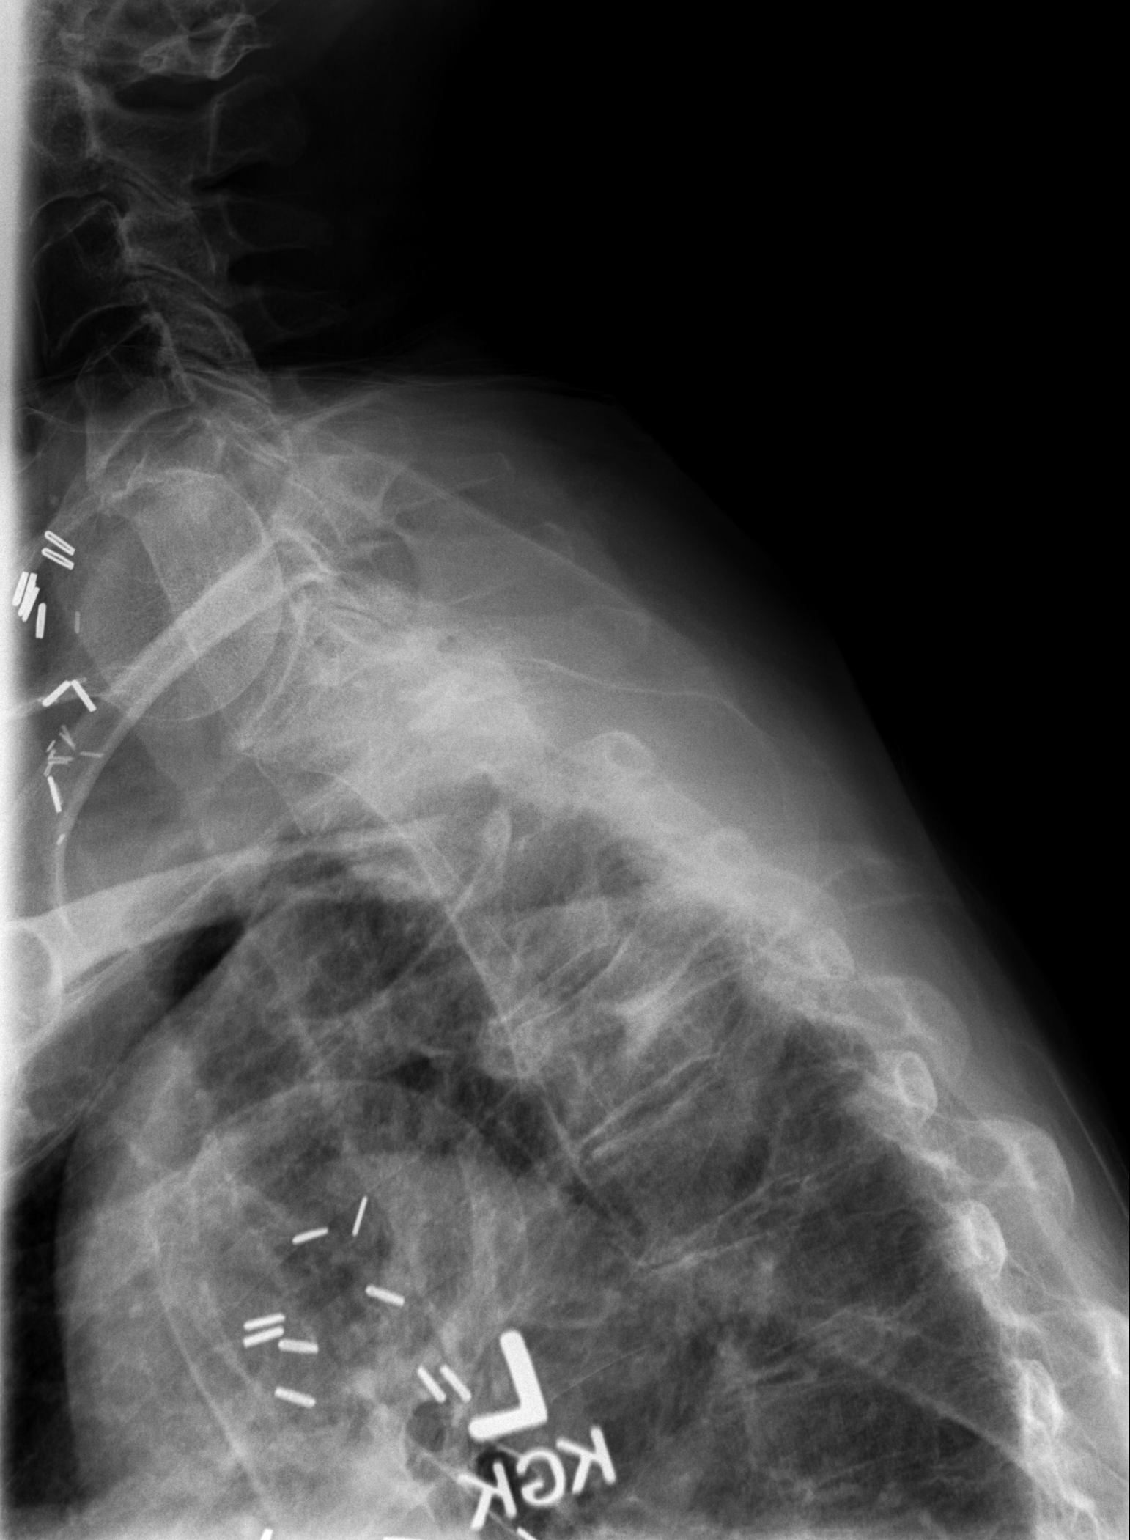

[3 of 3 positions shown; findings below may reference images not displayed]

FINDINGS: The bones are diffusely osteopenic with diffuse
degenerative change.  There is very slight loss of height of T9
vertebral body most likely due to mild compression deformity.  No
lytic or blastic lesion is seen.  This mild compression deformity
probably accounts for the linear activity in the region of T9 on
today's bone scan.
IMPRESSION: Probable mild compression deformity of T9 which correlates with
linear activity on today's bone scan.  No evidence of metastatic
disease is seen.

## 2010-05-29 ENCOUNTER — Encounter: Payer: Self-pay | Admitting: Orthopedic Surgery

## 2010-07-07 DIAGNOSIS — I639 Cerebral infarction, unspecified: Secondary | ICD-10-CM

## 2010-07-07 HISTORY — DX: Cerebral infarction, unspecified: I63.9

## 2010-07-21 ENCOUNTER — Encounter: Payer: Medicare Other | Admitting: Oncology

## 2010-07-21 DIAGNOSIS — C50419 Malignant neoplasm of upper-outer quadrant of unspecified female breast: Secondary | ICD-10-CM

## 2010-07-21 DIAGNOSIS — Z86718 Personal history of other venous thrombosis and embolism: Secondary | ICD-10-CM

## 2010-07-21 DIAGNOSIS — C189 Malignant neoplasm of colon, unspecified: Secondary | ICD-10-CM

## 2010-07-21 DIAGNOSIS — R197 Diarrhea, unspecified: Secondary | ICD-10-CM

## 2010-07-21 LAB — CBC WITH DIFFERENTIAL/PLATELET
BASO%: 0.3 % (ref 0.0–2.0)
HGB: 12.7 g/dL (ref 11.6–15.9)
MCH: 33.5 pg (ref 25.1–34.0)
MCV: 100 fL (ref 79.5–101.0)
MONO#: 0.5 10*3/uL (ref 0.1–0.9)
MONO%: 7 % (ref 0.0–14.0)
NEUT#: 5.2 10*3/uL (ref 1.5–6.5)
WBC: 6.9 10*3/uL (ref 3.9–10.3)

## 2010-07-22 LAB — COMPREHENSIVE METABOLIC PANEL
ALT: 37 U/L — ABNORMAL HIGH (ref 0–35)
AST: 40 U/L — ABNORMAL HIGH (ref 0–37)
Albumin: 4.2 g/dL (ref 3.5–5.2)
Alkaline Phosphatase: 79 U/L (ref 39–117)
BUN: 15 mg/dL (ref 6–23)
CO2: 26 mEq/L (ref 19–32)
Chloride: 107 mEq/L (ref 96–112)
Glucose, Bld: 131 mg/dL — ABNORMAL HIGH (ref 70–99)
Potassium: 3.7 mEq/L (ref 3.5–5.3)
Sodium: 145 mEq/L (ref 135–145)

## 2010-07-22 LAB — LACTATE DEHYDROGENASE: LDH: 223 U/L (ref 94–250)

## 2010-07-22 LAB — CANCER ANTIGEN 27.29: CA 27.29: 23 U/mL (ref 0–39)

## 2010-07-22 LAB — VITAMIN D 25 HYDROXY (VIT D DEFICIENCY, FRACTURES): Vit D, 25-Hydroxy: 33 ng/mL (ref 30–89)

## 2010-07-26 ENCOUNTER — Other Ambulatory Visit: Payer: Self-pay | Admitting: Gastroenterology

## 2010-07-27 ENCOUNTER — Emergency Department (HOSPITAL_COMMUNITY): Payer: Medicare Other

## 2010-07-27 ENCOUNTER — Inpatient Hospital Stay (HOSPITAL_COMMUNITY)
Admission: EM | Admit: 2010-07-27 | Discharge: 2010-08-02 | DRG: 064 | Disposition: A | Discharge status: Rehabilitation Facility | Payer: Medicare Other | Attending: Gastroenterology | Admitting: Gastroenterology

## 2010-07-27 ENCOUNTER — Encounter (HOSPITAL_COMMUNITY): Payer: Self-pay | Admitting: Radiology

## 2010-07-27 DIAGNOSIS — Z96659 Presence of unspecified artificial knee joint: Secondary | ICD-10-CM

## 2010-07-27 DIAGNOSIS — Z7901 Long term (current) use of anticoagulants: Secondary | ICD-10-CM

## 2010-07-27 DIAGNOSIS — E039 Hypothyroidism, unspecified: Secondary | ICD-10-CM | POA: Diagnosis present

## 2010-07-27 DIAGNOSIS — I619 Nontraumatic intracerebral hemorrhage, unspecified: Principal | ICD-10-CM | POA: Diagnosis present

## 2010-07-27 DIAGNOSIS — G936 Cerebral edema: Secondary | ICD-10-CM | POA: Diagnosis present

## 2010-07-27 DIAGNOSIS — Z85038 Personal history of other malignant neoplasm of large intestine: Secondary | ICD-10-CM

## 2010-07-27 DIAGNOSIS — N390 Urinary tract infection, site not specified: Secondary | ICD-10-CM | POA: Diagnosis present

## 2010-07-27 DIAGNOSIS — K3184 Gastroparesis: Secondary | ICD-10-CM | POA: Diagnosis present

## 2010-07-27 DIAGNOSIS — Z96649 Presence of unspecified artificial hip joint: Secondary | ICD-10-CM

## 2010-07-27 DIAGNOSIS — M069 Rheumatoid arthritis, unspecified: Secondary | ICD-10-CM | POA: Diagnosis present

## 2010-07-27 DIAGNOSIS — Z853 Personal history of malignant neoplasm of breast: Secondary | ICD-10-CM

## 2010-07-27 HISTORY — DX: Malignant neoplasm of unspecified site of unspecified female breast: C50.919

## 2010-07-27 HISTORY — DX: Other pulmonary embolism without acute cor pulmonale: I26.99

## 2010-07-27 LAB — COMPREHENSIVE METABOLIC PANEL
ALT: 35 U/L (ref 0–35)
AST: 43 U/L — ABNORMAL HIGH (ref 0–37)
Calcium: 8.2 mg/dL — ABNORMAL LOW (ref 8.4–10.5)
GFR calc Af Amer: >60 mL/min (ref >60)
Glucose, Bld: 130 mg/dL — ABNORMAL HIGH (ref 70–99)
Sodium: 136 mEq/L (ref 135–145)
Total Protein: 6.1 g/dL (ref 6.0–8.3)

## 2010-07-27 LAB — CBC
HCT: 36.2 % (ref 36.0–46.0)
MCHC: 34.5 g/dL (ref 30.0–36.0)
Platelets: 148 10*3/uL — ABNORMAL LOW (ref 150–400)
RDW: 13.4 % (ref 11.5–15.5)
WBC: 8.6 10*3/uL (ref 4.0–10.5)

## 2010-07-27 LAB — MRSA PCR SCREENING: MRSA by PCR: NEGATIVE

## 2010-07-27 LAB — DIFFERENTIAL
Basophils Absolute: 0 10*3/uL (ref 0.0–0.1)
Basophils Relative: 0 % (ref 0–1)
Eosinophils Absolute: 0 10*3/uL (ref 0.0–0.7)
Eosinophils Relative: 0 % (ref 0–5)
Lymphocytes Relative: 13 % (ref 12–46)
Monocytes Absolute: 0.9 10*3/uL (ref 0.1–1.0)

## 2010-07-27 LAB — URINALYSIS, ROUTINE W REFLEX MICROSCOPIC
Glucose, UA: NEGATIVE mg/dL
Ketones, ur: NEGATIVE mg/dL
pH: 5.5 (ref 5.0–8.0)

## 2010-07-27 LAB — URINE MICROSCOPIC-ADD ON

## 2010-07-27 LAB — PROTIME-INR
INR: 1.5 — ABNORMAL HIGH (ref 0.00–1.49)
Prothrombin Time: 18.3 seconds — ABNORMAL HIGH (ref 11.6–15.2)

## 2010-07-27 LAB — LIPASE, BLOOD: Lipase: 19 U/L (ref 11–59)

## 2010-07-28 ENCOUNTER — Inpatient Hospital Stay (HOSPITAL_COMMUNITY): Payer: Medicare Other

## 2010-07-28 ENCOUNTER — Other Ambulatory Visit (HOSPITAL_COMMUNITY): Payer: Medicare Other

## 2010-07-28 LAB — PREPARE FRESH FROZEN PLASMA: Unit division: 0

## 2010-07-28 LAB — BASIC METABOLIC PANEL
BUN: 4 mg/dL — ABNORMAL LOW (ref 6–23)
Calcium: 8.5 mg/dL (ref 8.4–10.5)
Creatinine, Ser: 0.56 mg/dL (ref 0.4–1.2)
GFR calc non Af Amer: >60 mL/min (ref >60)

## 2010-07-28 MED ORDER — GADOBENATE DIMEGLUMINE 529 MG/ML IV SOLN
12.0000 mL | Freq: Once | INTRAVENOUS | Status: AC
  Administered 2010-07-28: 12 mL via INTRAVENOUS

## 2010-07-28 NOTE — Consult Note (Signed)
NAMERHEGAN, TRUNNELL NO.:  [id number]  MEDICAL RECORD NO.:  [id number]           PATIENT TYPE:  I  LOCATION:  3106                         FACILITY:  [facility]  PHYSICIAN:  Levie Heritage, MD       DATE OF BIRTH:  Jul 14, 1933  DATE OF CONSULTATION:  07/27/2010 DATE OF DISCHARGE:                                CONSULTATION   REASON FOR CONSULTATION:  Intracranial hemorrhage.  CHIEF COMPLAINT:  Sudden confusion since yesterday.  HISTORY OF PRESENT ILLNESS:  A 77 year old woman was in her usual state of health and she went for colonoscopy yesterday and the patient noted after the colonoscopy later half of the day, she has been complaining of headache.  She has been throwing up continuously and she has been confused as well.  As per the accompanying son and husband, the patient is not aware completely of what is going on around her and it is not like her, although she is being moving all 4 limbs and responding back to the simple questions.  A CAT scan of the head was performed in the emergency department that has shown the right frontal intracranial hemorrhage.  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  Colon cancer, bilateral breast cancer, benign essential tumor, hypothyroidism, hypercholesterolemia.  She has multiple surgical procedures as well thyroidectomy, shoulder surgery, ulnar release procedure, cholecystectomy, tonsillectomy.  CURRENT MEDICATIONS: 1. She is on Plaquenil 200 mg  b.i.d. 2. Mysoline 250 mg half tablet daily 3 times a day. 3. Vitamin D. 4. Colestid. 5. Prednisone 5 mg half tablet daily. 6. Calcium. 7. P.r.n. basis of Dilaudid twice a day. 8. Levoxyl  200 mcg. 9. Zoloft and Coumadin 5 mg, which has been stopped for almost 5 days     now.  FAMILY HISTORY:  Mother died of vascular dementia, one brother had brain cancer.  __________ had  hypertension.  SOCIAL HISTORY:  She lives with her husband in the family.  She quit smoking in 1971.  There is no alcohol abuse.  REVIEW OF MEDICAL DATA:  I have reviewed her brain imaging and have noted the right frontal lobe hemorrhage with frontal midline shift.  I do not see any blood in the ventricle system.  PHYSICAL EXAMINATION:  VITAL SIGNS:  Systolic blood pressure 111, diastolic 58 mmHg, pulse of 95 per minute, 95% on room air. GENERAL:  The patient is alert, oriented x2. NEUROLOGIC:  She is appropriately responsive, although as per the family she is still confused little bit.  There is no change in her voice.  She has been complaining of headaches throughout.  Her bilateral pupils are reactive to light and accommodation.  There is no field cut.  Face is symmetrical with intact sensations.  Tongue is midline without atrophy or fasciculation.  Muscle strength is 5/5 all over in all the limbs. SENSORY:  She admits to feel sense of light touch all over. Gait was deferred.  IMPRESSION:  This patient is a 77 year old woman with multiple commodities who has had sudden  onset of confusion since yesterday in the later half of the day, and workup reveal intracranial hemorrhage in the frontal lobe. My impression is that the multiple possibilities can be  source of the hemorrhage in this situation, specially the patient with the history of breast cancer and possibility of mets.  A complete evaluation with the contrasted MRI will be required to rule out any mass related to the hemorrhage.  The possibility of hypertensive hemorrhage or the amyloid vasculopathy still exist.  PLAN:  I have discussed in detail over phone with Dr. Laural Benes who is the admitting physician as well as the patient's son and husband in detail at the bedside.  The patient will be admitted to the neuro monitoring ICU and we will check her q.2 hour neuro checks overnight.  Tomorrow morning, she will have the CT  scan of the head repeat.  I agree with reversing the INR with the FFP as already going on.  Please have the INR repeat in the morning labs along with the q. 6 hours sodium checks.  The goal blood pressure in this situation would be 140-160 mmHg systolic ranges.I am stopping lovenox. Neurology Stroke Team will follow the patient up in the morning and will proceed according to the case progresses.          ______________________________ Levie Heritage, MD     WS/MEDQ  D:  07/27/2010  T:  07/28/2010  Job:  [id number]  Electronically Signed by Levie Heritage MD on 07/28/2010 05:37:49 PM

## 2010-07-30 LAB — CBC
Platelets: 130 10*3/uL — ABNORMAL LOW (ref 150–400)
RDW: 13.3 % (ref 11.5–15.5)
WBC: 6.2 10*3/uL (ref 4.0–10.5)

## 2010-08-01 DIAGNOSIS — I619 Nontraumatic intracerebral hemorrhage, unspecified: Secondary | ICD-10-CM

## 2010-08-02 ENCOUNTER — Inpatient Hospital Stay (HOSPITAL_COMMUNITY)
Admission: RE | Admit: 2010-08-02 | Discharge: 2010-08-19 | DRG: 945 | Disposition: A | Discharge status: Home Health | Payer: Medicare Other | Source: Other Acute Inpatient Hospital | Attending: Physical Medicine & Rehabilitation | Admitting: Physical Medicine & Rehabilitation

## 2010-08-02 DIAGNOSIS — M069 Rheumatoid arthritis, unspecified: Secondary | ICD-10-CM | POA: Diagnosis present

## 2010-08-02 DIAGNOSIS — K3184 Gastroparesis: Secondary | ICD-10-CM | POA: Diagnosis present

## 2010-08-02 DIAGNOSIS — Z87891 Personal history of nicotine dependence: Secondary | ICD-10-CM

## 2010-08-02 DIAGNOSIS — Z860100 Personal history of colon polyps, unspecified: Secondary | ICD-10-CM

## 2010-08-02 DIAGNOSIS — G8190 Hemiplegia, unspecified affecting unspecified side: Secondary | ICD-10-CM | POA: Diagnosis present

## 2010-08-02 DIAGNOSIS — Z5189 Encounter for other specified aftercare: Principal | ICD-10-CM

## 2010-08-02 DIAGNOSIS — D696 Thrombocytopenia, unspecified: Secondary | ICD-10-CM | POA: Diagnosis present

## 2010-08-02 DIAGNOSIS — Z981 Arthrodesis status: Secondary | ICD-10-CM

## 2010-08-02 DIAGNOSIS — I619 Nontraumatic intracerebral hemorrhage, unspecified: Secondary | ICD-10-CM | POA: Diagnosis present

## 2010-08-02 DIAGNOSIS — Z853 Personal history of malignant neoplasm of breast: Secondary | ICD-10-CM

## 2010-08-02 DIAGNOSIS — N390 Urinary tract infection, site not specified: Secondary | ICD-10-CM | POA: Diagnosis present

## 2010-08-02 DIAGNOSIS — R339 Retention of urine, unspecified: Secondary | ICD-10-CM | POA: Diagnosis present

## 2010-08-02 DIAGNOSIS — Z96649 Presence of unspecified artificial hip joint: Secondary | ICD-10-CM

## 2010-08-02 DIAGNOSIS — H53469 Homonymous bilateral field defects, unspecified side: Secondary | ICD-10-CM | POA: Diagnosis present

## 2010-08-02 DIAGNOSIS — A498 Other bacterial infections of unspecified site: Secondary | ICD-10-CM | POA: Diagnosis present

## 2010-08-02 DIAGNOSIS — Z85038 Personal history of other malignant neoplasm of large intestine: Secondary | ICD-10-CM

## 2010-08-02 DIAGNOSIS — E876 Hypokalemia: Secondary | ICD-10-CM | POA: Diagnosis present

## 2010-08-02 DIAGNOSIS — Z7901 Long term (current) use of anticoagulants: Secondary | ICD-10-CM

## 2010-08-02 DIAGNOSIS — Z86718 Personal history of other venous thrombosis and embolism: Secondary | ICD-10-CM

## 2010-08-02 DIAGNOSIS — D62 Acute posthemorrhagic anemia: Secondary | ICD-10-CM | POA: Diagnosis present

## 2010-08-02 DIAGNOSIS — I609 Nontraumatic subarachnoid hemorrhage, unspecified: Secondary | ICD-10-CM

## 2010-08-02 DIAGNOSIS — Z96659 Presence of unspecified artificial knee joint: Secondary | ICD-10-CM

## 2010-08-02 DIAGNOSIS — E039 Hypothyroidism, unspecified: Secondary | ICD-10-CM | POA: Diagnosis present

## 2010-08-02 DIAGNOSIS — G250 Essential tremor: Secondary | ICD-10-CM | POA: Diagnosis present

## 2010-08-02 LAB — CBC
Platelets: 135 10*3/uL — ABNORMAL LOW (ref 150–400)
RBC: 3.63 MIL/uL — ABNORMAL LOW (ref 3.87–5.11)
RDW: 13.5 % (ref 11.5–15.5)
WBC: 6.6 10*3/uL (ref 4.0–10.5)

## 2010-08-03 ENCOUNTER — Inpatient Hospital Stay (HOSPITAL_COMMUNITY): Payer: Medicare Other

## 2010-08-03 DIAGNOSIS — G8110 Spastic hemiplegia affecting unspecified side: Secondary | ICD-10-CM

## 2010-08-03 DIAGNOSIS — I609 Nontraumatic subarachnoid hemorrhage, unspecified: Secondary | ICD-10-CM

## 2010-08-03 DIAGNOSIS — M79609 Pain in unspecified limb: Secondary | ICD-10-CM

## 2010-08-03 DIAGNOSIS — Z5189 Encounter for other specified aftercare: Secondary | ICD-10-CM

## 2010-08-03 LAB — COMPREHENSIVE METABOLIC PANEL
ALT: 20 U/L (ref 0–35)
Albumin: 3 g/dL — ABNORMAL LOW (ref 3.5–5.2)
Alkaline Phosphatase: 64 U/L (ref 39–117)
BUN: 8 mg/dL (ref 6–23)
Chloride: 100 mEq/L (ref 96–112)
Potassium: 2.9 mEq/L — ABNORMAL LOW (ref 3.5–5.1)
Sodium: 139 mEq/L (ref 135–145)
Total Bilirubin: 0.6 mg/dL (ref 0.3–1.2)

## 2010-08-03 LAB — CBC
HCT: 34.3 % — ABNORMAL LOW (ref 36.0–46.0)
MCV: 99.7 fL (ref 78.0–100.0)
Platelets: 123 10*3/uL — ABNORMAL LOW (ref 150–400)
RBC: 3.44 MIL/uL — ABNORMAL LOW (ref 3.87–5.11)
WBC: 5.6 10*3/uL (ref 4.0–10.5)

## 2010-08-03 LAB — DIFFERENTIAL
Basophils Absolute: 0 10*3/uL (ref 0.0–0.1)
Eosinophils Absolute: 0.1 10*3/uL (ref 0.0–0.7)
Lymphocytes Relative: 25 % (ref 12–46)
Lymphs Abs: 1.4 10*3/uL (ref 0.7–4.0)
Neutrophils Relative %: 63 % (ref 43–77)

## 2010-08-03 NOTE — Consult Note (Signed)
[name], [name]                ACCOUNT NO.:  [id number]  MEDICAL RECORD NO.:  [id number]           PATIENT TYPE:  I  LOCATION:  3106                         FACILITY:  [facility]  PHYSICIAN:  Pramod P. Pearlean Brownie, MD    DATE OF BIRTH:  05/23/1933  DATE OF CONSULTATION:  07/28/2010 DATE OF DISCHARGE:                                CONSULTATION   REFERRING PHYSICIAN:  Danise Edge, MD  REASON FOR REFERRAL:  Brain hemorrhage.  HISTORY OF PRESENT ILLNESS:  [name] is a 77 year old pleasant Caucasian lady who underwent surveillance coloscopy on July 26, 2010, and had 5 polyps removed.  She has a history of adenocarcinoma of the colon in 2007, for which she had undergone right hemicolectomy.  She had previous history of deep vein thrombosis with pulmonary embolism and had been on long-term warfarin which had been held for the procedure.  Her INR yesterday was 1.5.  She apparently complained of nausea, vomiting whole night and also headache, next day morning she was found to be confused.  She is complaining of severe right temporal headache and hence a CT scan was obtained yesterday which showed 4.4 x 3.2 x 6 cm parenchymal right frontal hemorrhage with slight extension into the ventricular system without hydrocephalus.  She has been admitted to the Intensive Care Unit and blood pressure has not been significantly elevated.  The repeat CT scan from this morning shows stable appearance of the hemorrhage with only 3-mm right-to-left shift.  The patient has remained awake throughout.  There is no witnessed seizure activity.  The patient still complains of right temporal headache and nausea and confusion seems to be improving.  She has no prior history of stroke, TIA, seizures or significant neurological problems.  PAST MEDICAL HISTORY:  Significant only for mild COPD and breast cancer as stated above.  Also include, benign essential tremor.  HOME MEDICATIONS:  Plaquenil, Mysoline,  vitamin D, Colestid, prednisone, calcium, Dilaudid, Levoxyl, Zoloft and Coumadin.  ALLERGIES TO MEDICATIONS:  FENTANYL causes nausea, DEMEROL causes rash, MORPHINE rash, NSAIDs GI upset and TALWIN causes hallucinations.  SOCIAL HISTORY:  She is married, lives with her husband.  She is a housewife.  Does not drink alcohol.  Quit smoking in 1971.  REVIEW OF SYSTEMS:  As documented above in history of present illness.  PHYSICAL EXAMINATION:  GENERAL:  A pleasant elderly Caucasian lady who currently does not appear to be in distress.  She is afebrile. VITAL SIGNS:  Temperature 98.5, respiratory rate 16 per minute, blood pressure 143/67, pulse rate 69 per minute, regular sinus.  HEAD: Nontraumatic. NECK:  Supple.  There is no bruit. CARDIAC:  No murmur or gallop. LUNGS:  Clear to auscultation. ABDOMEN:  Soft and nontender. NEUROLOGICAL:  The patient is awake and alert, but she is slow to respond.  She has a bland affect.  She appears abulic.  She is easily distractible.  Does not focus.  She is slow to respond to questions. Her recall is diminished 2/3.  She has poor registration and calculation.  Speech appears normal.  There is no dysarthria.  Eye movements are full range  without nystagmus.  She blinks to threat bilaterally.  Face appears symmetric.  Tongue is midline.  Motor system exam reveals no upper or lower extremity drift.  She has at least antigravity strength in all 4 extremities, but it is not cooperative for detailed muscle testing.  Touch pinprick appears to be preserved. Coordination is slow, but accurate.  Gait was not tested.  DATA REVIEWED:  CT scan findings as stated above.  Electrolytes are normal.  INR is 1.5 yesterday.  UA shows 11-20 wbc's and small leukocyte esterase.  IMPRESSION:  A 77 year old lady with headache, confusion and nausea secondary to 4.4 x 3.2 x 6 cm parenchymal right frontal intracerebral hemorrhage.  Exact etiology unclear, but possibly  hypertensive versus amyloid or metastatic disease.  PLAN:  Agree with intensive ICU monitoring with strict blood pressure control.  Check MRI scan of the brain to rule out any underlying structural lesions.  She was treated for urinary tract infection. Physical and occupational speech therapy consults.  Changed Tylenol to Ultram for headaches.  I would be happy to follow the patient with consults.     Pramod P. Pearlean Brownie, MD     PPS/MEDQ  D:  07/28/2010  T:  07/29/2010  Job:  [id number]  Electronically Signed by Delia Heady MD on 08/03/2010 11:09:08 AM

## 2010-08-04 DIAGNOSIS — I609 Nontraumatic subarachnoid hemorrhage, unspecified: Secondary | ICD-10-CM

## 2010-08-04 DIAGNOSIS — Z5189 Encounter for other specified aftercare: Secondary | ICD-10-CM

## 2010-08-04 DIAGNOSIS — G8110 Spastic hemiplegia affecting unspecified side: Secondary | ICD-10-CM

## 2010-08-04 LAB — URINE MICROSCOPIC-ADD ON

## 2010-08-04 LAB — URINALYSIS, ROUTINE W REFLEX MICROSCOPIC
Glucose, UA: NEGATIVE mg/dL
Ketones, ur: NEGATIVE mg/dL
Nitrite: POSITIVE — AB
Specific Gravity, Urine: 1.009 (ref 1.005–1.030)
pH: 7 (ref 5.0–8.0)

## 2010-08-05 DIAGNOSIS — Z5189 Encounter for other specified aftercare: Secondary | ICD-10-CM

## 2010-08-05 DIAGNOSIS — G8110 Spastic hemiplegia affecting unspecified side: Secondary | ICD-10-CM

## 2010-08-05 DIAGNOSIS — I609 Nontraumatic subarachnoid hemorrhage, unspecified: Secondary | ICD-10-CM

## 2010-08-05 LAB — CBC
MCHC: 34.4 g/dL (ref 30.0–36.0)
Platelets: 135 10*3/uL — ABNORMAL LOW (ref 150–400)
RDW: 13.4 % (ref 11.5–15.5)
WBC: 8.2 10*3/uL (ref 4.0–10.5)

## 2010-08-05 LAB — BASIC METABOLIC PANEL
Calcium: 8.5 mg/dL (ref 8.4–10.5)
GFR calc Af Amer: >60 mL/min (ref >60)
GFR calc non Af Amer: >60 mL/min (ref >60)
Glucose, Bld: 110 mg/dL — ABNORMAL HIGH (ref 70–99)
Potassium: 3.9 mEq/L (ref 3.5–5.1)
Sodium: 140 mEq/L (ref 135–145)

## 2010-08-06 LAB — URINE CULTURE
Colony Count: >=100000
Culture  Setup Time: 201203291713

## 2010-08-08 ENCOUNTER — Inpatient Hospital Stay (HOSPITAL_COMMUNITY): Payer: Medicare Other

## 2010-08-08 DIAGNOSIS — I609 Nontraumatic subarachnoid hemorrhage, unspecified: Secondary | ICD-10-CM

## 2010-08-08 DIAGNOSIS — M069 Rheumatoid arthritis, unspecified: Secondary | ICD-10-CM

## 2010-08-08 DIAGNOSIS — Z5189 Encounter for other specified aftercare: Secondary | ICD-10-CM

## 2010-08-08 DIAGNOSIS — I69919 Unspecified symptoms and signs involving cognitive functions following unspecified cerebrovascular disease: Secondary | ICD-10-CM

## 2010-08-08 DIAGNOSIS — G8110 Spastic hemiplegia affecting unspecified side: Secondary | ICD-10-CM

## 2010-08-11 LAB — CBC
HCT: 35.4 % — ABNORMAL LOW (ref 36.0–46.0)
MCH: 33.1 pg (ref 26.0–34.0)
MCV: 99.4 fL (ref 78.0–100.0)
Platelets: 180 10*3/uL (ref 150–400)
RBC: 3.56 MIL/uL — ABNORMAL LOW (ref 3.87–5.11)
RDW: 12.9 % (ref 11.5–15.5)
WBC: 6.7 10*3/uL (ref 4.0–10.5)

## 2010-08-11 LAB — DIFFERENTIAL
Eosinophils Absolute: 0.1 10*3/uL (ref 0.0–0.7)
Eosinophils Relative: 2 % (ref 0–5)
Lymphocytes Relative: 25 % (ref 12–46)
Lymphs Abs: 1.7 10*3/uL (ref 0.7–4.0)
Monocytes Relative: 10 % (ref 3–12)

## 2010-08-16 DIAGNOSIS — I69919 Unspecified symptoms and signs involving cognitive functions following unspecified cerebrovascular disease: Secondary | ICD-10-CM

## 2010-08-16 DIAGNOSIS — I619 Nontraumatic intracerebral hemorrhage, unspecified: Secondary | ICD-10-CM

## 2010-08-16 DIAGNOSIS — M069 Rheumatoid arthritis, unspecified: Secondary | ICD-10-CM

## 2010-08-16 DIAGNOSIS — G8110 Spastic hemiplegia affecting unspecified side: Secondary | ICD-10-CM

## 2010-08-16 DIAGNOSIS — Z5189 Encounter for other specified aftercare: Secondary | ICD-10-CM

## 2010-08-16 NOTE — H&P (Signed)
[name] NO.:  [id number]  MEDICAL RECORD NO.:  [id number]           PATIENT TYPE:  I  LOCATION:  3106                         FACILITY:  [facility]  PHYSICIAN:  Danise Edge, M.D.   DATE OF BIRTH:  06/08/1933  DATE OF ADMISSION:  07/27/2010 DATE OF DISCHARGE:                             HISTORY & PHYSICAL   ADMISSION DIAGNOSIS:  Post-colonoscopy with polypectomy headache, fever, confusion, right lower quadrant abdominal discomfort and left costovertebral angle discomfort.  HISTORY:  [name] is a 77 year old female, born on 1933/07/07.  In 2007, the patient underwent a right colectomy to remove a T3 N0 adenocarcinoma of the colon.  On July 27, 2010, the patient underwent her first postoperative surveillance colonoscopy performed using intravenous Versed 7.5 mg as conscious sedation.  She did not receive a narcotic.  Two 10-mm sized polyps were removed from the descending colon the using the hot snare, a 10-mm sized polyp was removed from the midtransverse colon using a hot snare, a 5-mm polyp was removed from the midsigmoid colon using a hot snare, and extensive sigmoid diverticulosis was noted unassociated with signs of diverticulitis.  The patient was instructed to remain off Coumadin for an additional week, post-colonoscopic polypectomy.  The patient developed nausea and vomiting postprocedure that resolved. According to the patient's husband, she developed confusion through the night and a low-grade temperature.  The patient consumed a normal breakfast this morning.  The patient is complaining of a right temporal headache, right costovertebral angle discomfort, and crampy right lower quadrant abdominal pain.  Yesterday, the patient received Zofran for nausea.  She missed a dose of prednisone yesterday, but did not miss her other chronic medications. She denies cough, dysuria, or gastrointestinal bleeding.  MEDICATION  ALLERGIES: 1. TALWIN causes hallucinations. 2. FENTANYL causes nausea. 3. DEMEROL causes rash. 4. MORPHINE causes rash. 5. NSAIDs cause GI upset.  CHRONIC MEDICATIONS: 1. Plaquenil 200 mg twice daily for rheumatoid arthritis. 2. Mysoline (250 mg tablets) one half tablet in the morning, one half     tablet at noon, and one tablet in the evening. 3. Vitamin D 1000 international units daily. 4. Colestid 2 g daily. 5. Prednisone 7.5 mg alternating with 5 mg daily. 6. Calcium 500 mg daily. 7. Dilaudid 2 mg twice daily as needed. 8. Levoxyl 400 mcg on Tuesday, Thursday, Saturday, and Sunday, 300 mg     on Monday, Wednesday, and Friday. 9. Zoloft 100 mg daily. 10.Coumadin 7.5 mg on Monday and Thursday and 5 mg on other days. 11.Coumadin is on hold until August 03, 2010.  HABITS:  The patient quit smoking cigarettes in 1971.  She does not consume alcohol.  She is married and a retired Designer, jewellery.  PAST MEDICAL HISTORY: 1. Tonsillectomy at age 82. 2. Left ankle surgery due to her ruptured tendon in 1954. 3. Spontaneous abortion with D and C in 1959. 4. Appendectomy in 1962. 5. D and C for a retained placenta in 1963. 6. Right carpal tunnel surgery in 1964. 7. Thoracic outlet syndrome, requiring bilateral first rib resection. 8. Left carpal tunnel surgery in 1977.  9. Right ulnar nerve reposition in 1963. 10.Cholecystectomy. 11.Left ulnar nerve reposition in 1980. 12.Right knee arthroscopy in 1984. 13.Left ankle surgery in 1984. 14.Left knee arthroscopy in 1984. 15.Malignant right breast biopsy in 1984. 16.Right breast lumpectomy in 1984. 17.Right axillary node resection in 1984. 18.Lymphedema of the right arm in 1984. 19.Radiation therapy to the right breast from April 26, 1983     through June 27, 1983. 20.Left ankle surgery due to a ruptured tendon in 1985. 21.Right clavicle resection. 22.Left breast biopsy. 23.Left breast mastectomy for breast cancer. 24.Left  neck node biopsy (benign) in 1986. 25.Left breast reconstruction in 1986. 26.Total right knee replacement complicated by pulmonary emboli in     July 24, 1985. 27.D and C in 1987. 28.Left breast implant replacement in 1987. 29.Right shoulder arthroscopy due to rotator cuff tear in 1988. 30.Lumbar laminectomy in 1988. 31.Maxillary sinus endoscopy in 1988. 32.Anterior cervical fusion, C5 through C7 in 1989. 33.Left breast implant replacement in 1990. 34.Lumbar laminectomy in 1990. 35.Facet rhizotomy in 1990. 36.Left total knee replacement in 1991. 37.Left shoulder arthroscopy in 1991. 38.Total lumbar fusion, fixation, stabilization in 1991. 39.Left knee arthroscopy in 1992. 40.Left breast replacement removal in 1993. 41.Revision of total lumbar fusion in 1995. 42.Right carpal tunnel release in 1996. 43.Left carpal tunnel release in 1996. 44.Left rotator cuff surgery in 1996. 45.Left great toe bunionectomy in 1997. 46.Total thyroidectomy in 1998. 47.Total right hip replacement in 1998. 48.Right total knee revision in 2000. 49.Left hip replacement in 2000. 50.Excision ossificans, left hip in 2001. 51.Sudden total hearing loss (sensorineural, right ear) in 2001. 52.Meniere disease diagnosed in 2002. 53.Tympanectomy in 2002. 54.Cataract removal of right eye in 2002. 55.Sacral fracture in 2003. 56.Revision of left thumb and carpal tunnel in 2004. 57.Rotator cuff surgery of left shoulder in 2004. 58.Right breast removal (adenocarcinoma) in 2005. 59.Cryoablation second cranial nerves bilaterally in 2006. 60.Left total knee replacement revision in November 2006. 61.Right colectomy for adenocarcinoma of the right colon in 2007. 62.Contracture release, left little finger in September 2008. 63.Right wrist fracture with surgical correction in April 2010. 64.Left hip replacement revision in 2010. 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 66.Left big toe fusion in  2011.  PHYSICAL EXAMINATION:  VITAL SIGNS:  Room air oxygen saturation 99%. Blood pressure normal.  Temperature 99 degrees.  Weight 131 pounds on June 21, 2010. GENERAL APPEARANCE:  The patient appears alert and answers questions appropriately.  She does not appear in respiratory distress. HEENT:  Sclerae are nonicteric.  Mouth and throat appear normal. Thyroid exam is normal. NECK:  There is no cervical adenopathy. LUNGS:  Clear to auscultation. CARDIAC:  Reveals a regular rhythm without audible murmurs.  A 2-D cardiac echo performed last year reveals mild mitral regurgitation, mild aortic valve stenosis, and mild aortic valve regurgitation. ABDOMEN:  Soft and flat.  There is right lower quadrant discomfort to deep palpation.  Bowel sounds are normal. SKIN:  Warm and dry.  ASSESSMENT: 1. Post-colonoscopic polypectomy headache, fever, confusion, right     lower quadrant abdominal discomfort, and right costovertebral     discomfort. 2. Chronic Coumadin anticoagulation for history of pulmonary embolus. 3. Chronic prednisone immunosuppression for rheumatoid arthritis. 4. Chronic Dilaudid and Zoloft use. 5. Hypothyroidism, post-thyroidectomy for goiter. 6. Gastroparesis.  PLAN:  Check admission CBC with differential, urinalysis, complete metabolic profile, lipase, and prothrombin time, INR off Coumadin.  To rule out aspiration, I will perform the PA and lateral chest x-ray.  To rule out colonic perforation, diverticulitis, and  thermal injury to the right colon, I will start the patient on intravenous Unasyn and perform an acute abdominal x-ray series and CT scan of the abdomen and pelvis. To rule out urinary tract infection and pyelonephritis, I will check the patient's urinalysis.  To cover for stress, I will start her on Solu-Medrol 50 mg every 8 hours intravenously.  To assess the patient's confusion and right temporal headache, I will perform a CT scan of the brain.  On  exam, the patient had no focal neurologic signs to suggest acute ischemic or vascular event.          ______________________________ Danise Edge, M.D.     MJ/MEDQ  D:  07/27/2010  T:  07/28/2010  Job:  [id number]  cc:   Theressa Millard, M.D.  Electronically Signed by Danise Edge M.D. on 08/16/2010 12:09:51 PM

## 2010-08-17 LAB — URINE MICROSCOPIC-ADD ON

## 2010-08-17 LAB — CBC
HCT: 33.3 % — ABNORMAL LOW (ref 36.0–46.0)
Hemoglobin: 13.4 g/dL (ref 12.0–15.0)
MCHC: 34.2 g/dL (ref 30.0–36.0)
MCHC: 34.9 g/dL (ref 30.0–36.0)
MCV: 101.2 fL — ABNORMAL HIGH (ref 78.0–100.0)
MCV: 102 fL — ABNORMAL HIGH (ref 78.0–100.0)
MCV: 102.6 fL — ABNORMAL HIGH (ref 78.0–100.0)
Platelets: 92 10*3/uL — ABNORMAL LOW (ref 150–400)
RBC: 3.15 MIL/uL — ABNORMAL LOW (ref 3.87–5.11)
RBC: 3.82 MIL/uL — ABNORMAL LOW (ref 3.87–5.11)
RDW: 13.7 % (ref 11.5–15.5)
RDW: 13.8 % (ref 11.5–15.5)
RDW: 13.8 % (ref 11.5–15.5)

## 2010-08-17 LAB — BASIC METABOLIC PANEL
BUN: 10 mg/dL (ref 6–23)
BUN: 5 mg/dL — ABNORMAL LOW (ref 6–23)
CO2: 29 mEq/L (ref 19–32)
CO2: 29 mEq/L (ref 19–32)
Chloride: 102 mEq/L (ref 96–112)
Chloride: 103 mEq/L (ref 96–112)
Creatinine, Ser: 0.57 mg/dL (ref 0.4–1.2)
Creatinine, Ser: 0.65 mg/dL (ref 0.4–1.2)
GFR calc Af Amer: >60 mL/min (ref >60)
GFR calc non Af Amer: >60 mL/min (ref >60)
Glucose, Bld: 104 mg/dL — ABNORMAL HIGH (ref 70–99)
Glucose, Bld: 118 mg/dL — ABNORMAL HIGH (ref 70–99)
Glucose, Bld: 119 mg/dL — ABNORMAL HIGH (ref 70–99)
Potassium: 3.2 mEq/L — ABNORMAL LOW (ref 3.5–5.1)
Sodium: 139 mEq/L (ref 135–145)

## 2010-08-17 LAB — PROTIME-INR: Prothrombin Time: 19.4 seconds — ABNORMAL HIGH (ref 11.6–15.2)

## 2010-08-17 LAB — DIFFERENTIAL
Basophils Absolute: 0 10*3/uL (ref 0.0–0.1)
Basophils Relative: 0 % (ref 0–1)
Eosinophils Absolute: 0.1 10*3/uL (ref 0.0–0.7)
Eosinophils Relative: 1 % (ref 0–5)
Monocytes Absolute: 0.3 10*3/uL (ref 0.1–1.0)
Monocytes Relative: 6 % (ref 3–12)

## 2010-08-17 LAB — URINALYSIS, ROUTINE W REFLEX MICROSCOPIC
Glucose, UA: NEGATIVE mg/dL
Ketones, ur: NEGATIVE mg/dL
Protein, ur: 30 mg/dL — AB

## 2010-08-17 LAB — HEMOGLOBIN A1C: Hgb A1c MFr Bld: 5.8 % — ABNORMAL HIGH (ref <5.7)

## 2010-08-18 DIAGNOSIS — Z5189 Encounter for other specified aftercare: Secondary | ICD-10-CM

## 2010-08-18 DIAGNOSIS — M069 Rheumatoid arthritis, unspecified: Secondary | ICD-10-CM

## 2010-08-18 DIAGNOSIS — I619 Nontraumatic intracerebral hemorrhage, unspecified: Secondary | ICD-10-CM

## 2010-08-18 DIAGNOSIS — G8110 Spastic hemiplegia affecting unspecified side: Secondary | ICD-10-CM

## 2010-08-18 DIAGNOSIS — I69919 Unspecified symptoms and signs involving cognitive functions following unspecified cerebrovascular disease: Secondary | ICD-10-CM

## 2010-08-19 DIAGNOSIS — I619 Nontraumatic intracerebral hemorrhage, unspecified: Secondary | ICD-10-CM

## 2010-08-19 DIAGNOSIS — Z5189 Encounter for other specified aftercare: Secondary | ICD-10-CM

## 2010-08-19 DIAGNOSIS — M069 Rheumatoid arthritis, unspecified: Secondary | ICD-10-CM

## 2010-08-19 DIAGNOSIS — G8110 Spastic hemiplegia affecting unspecified side: Secondary | ICD-10-CM

## 2010-08-19 DIAGNOSIS — I69919 Unspecified symptoms and signs involving cognitive functions following unspecified cerebrovascular disease: Secondary | ICD-10-CM

## 2010-08-19 LAB — URINE CULTURE: Special Requests: POSITIVE

## 2010-09-02 NOTE — Discharge Summary (Signed)
[name], [name] NO.:  [id number]  MEDICAL RECORD NO.:  [id number]           PATIENT TYPE:  I  LOCATION:  4146                         FACILITY:  [facility]  PHYSICIAN:  Erick Colace, M.D.DATE OF BIRTH:  1933/07/03  DATE OF ADMISSION:  08/02/2010 DATE OF DISCHARGE:  08/19/2010                              DISCHARGE SUMMARY   DISCHARGE DIAGNOSES: 1. Right frontal lobe hemorrhage. 2. Benign essential tremor. 3. Rheumatoid arthritis with chronic steroids. 4. Urinary retention, resolved. 5. Recurrent Escherichia coli urinary tract infection. 6. Hypokalemia, resolved. 7. Acute blood loss anemia. 8. Thrombocytopenia, resolved. 9. Impaired fasting glucose.  HISTORY OF PRESENT ILLNESS:  [name] is a 77 year old female with history of breast cancer, colon cancer, RA, chronic Coumadin for DVTs and PEs, who underwent surveillance colonoscopy on July 26, 2010, with excision of colon polyps.  She was admitted on July 27, 2010, with confusion and severe headache.  CT of head done showed 3.4 x 4.4 cm hemorrhage right frontal lobe with intraventricular and small amount of subarachnoid hemorrhage.  As INR 1.5, the patient was treated with fresh frozen plasma.  MRI, MRA of brain done revealed large right frontal lobe hematoma with mass effect and small amount of intraventricular hemorrhage, indeterminate cause.  No aneurysm noted.  Neuro was consulted and feels bleed may be due to hypertension versus and amyloid angiopathy versus malignancy and MRI of brain to be repeated in 1 month. The patient currently continues with left-sided weakness.  Noted to have decreased attention with perseveration, decrease initiation as well as problems with thought organization.  She requires cues for safety with mobility as well as for attention.  The patient was evaluated by Rehab and they felt that she would be a good CIR candidate.  PAST MEDICAL HISTORY:  Positive for  gastroparesis, cholecystectomy, rheumatoid arthritis, bilateral carpal tunnel release, right total knee replacement, left total knee replacement with revision, bilateral total hip replacements, chronic tremors, bilateral mastectomy for cancer, right hemicolectomy for cancer, left rotator cuff repair, ORIF right distal radius fracture, appendectomy, D and C, total thyroidectomy, history of emphysema, ACDF, lumbar lam x2, depression, cryoablation, bilateral cranial nerves II facet rhizotomies, anxiety disorder, bilateral first rib resection for thoracic outlet syndrome, right clavicle resection, bilateral ulnar nerve repositioning, Meniere disease, left great toe fusion, left Achilles tendon repair, history of sacral fracture, 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  REVIEW OF SYMPTOMS:  Positive for decreased hearing, diarrhea, recent incontinence issues, weakness, headaches, as well as pain in her sacral area as well as right elbow with range of motion.  FAMILY HISTORY:  Positive for breast cancer, colon cancer, and coronary artery disease.  SOCIAL HISTORY:  The patient is a retired Engineer, civil (consulting), lives with husband in 1- level home with 3 steps at entry.  Quit tobacco in 1971.  Has a glass of wine once a week.  Husband is supportive and can provide supervision past discharge.  FUNCTIONAL HISTORY:  The patient was independent but has been using a walker since her toe fusion in December 2011.  She does not drive.  FUNCTIONAL STATUS:  The patient is mod to total assist for bed mobility, +2 total assist 60% to mod assist for transfers, min assist ambulating 100 feet with rolling walker.  She requires min assist for upper body care, max assist for lower body care, min assist for grooming.  PHYSICAL EXAMINATION:  VITAL SIGNS:  Blood pressure 131/80, pulse 71, respiratory rate 18, temperature 98.1. GENERAL:  The patient is pleasant female with flat affect, in no  acute distress. HEENT:  Eyes, anicteric and noninjected.  Pupils equal, round, and reactive to light.  Oral mucosa is pink and moist.  Hearing decreased. NECK:  Supple without JVD or lymphadenopathy. LUNGS:  Clear to auscultation bilaterally without wheezes, rales, or rhonchi. HEART:  Regular rate and rhythm without murmurs, gallops, or rubs. ABDOMEN:  Soft, nontender with positive bowel sounds. SKIN:  Notable for multiple well-healed old surgical incisions.  There was no breakdown. NEUROLOGIC:  Cranial nerves II-XII notable for decreased hearing on the right side.  She has impaired vision to the left and nearly left anonymous hemianopsia.  Reflexes 1+.  Sensation grossly intact in all 4 limbs.  Judgment and orientation a bit inhibited due to lack of her awareness as well as difficulty with apraxia, motor planning as well as deficits in high-level decision making.  She is able to follow simple one-step commands.  Mood is very flat.  Strength in left upper extremities 2-3+/5 depending on effort.  Left lower extremity is again variable generally 3-4/5.  Right upper extremity and right lower extremity 4/5 with apraxia noted also.  HOSPITAL COURSE:  [name] was admitted to Rehab on August 02, 2010, for inpatient therapies to consist of PT, OT, and Speech Therapy at least 3 hours 5 days a week.  Past admission physiatrist, rehab RN, and therapy team have worked together to provide customized collaborative interdisciplinary care.  A rehab RN has worked with the patient on bowel and bladder program as well as close monitoring of p.o. intake.  Routine pressure relief measures were undertaken during this stay.  Due to the patient's complaints of left elbow pain, x-rays of left elbow was done, showing mild degenerative changes with spurring at medial and lateral humeral epicondyle, no acute osseous findings.  The patient also with complaint of coccygeal pain and x-rays done  revealed bones to be osteopenic with degenerative changes in both SI joints and lower lumbar spine and no evidence of fracture or dislocation.  The patient was started on Flector patches to the left elbow that improved her symptomatology greatly.  This was discontinued as symptoms did improve with left elbow pain being resolved during this stay.  Flector patches were placed at sacroiliac to help with sacral pain and this is greatly effective currently.  The patient has had issues with headaches that have resolved by the time of discharge.  She was advised to continue using Ultram on p.r.n. basis if headaches recur.  Family reported the patient having issues with diarrhea during her stay and acute.  Her Colestid was resumed.  The patient was started on Florastor to help with her GI symptoms.  Routine labs were done past admission revealing H and H at 11.6 and 34.3, white count at 5.6, platelets 123. Repeat CBC was done to follow up on a thrombocytopenia and showed this to have resolved.  Labs of August 11, 2010, revealed H and H stable at 11.8 and 35.4, platelets at 180.  Check of electrolytes were done during this stay to monitor the patient's hydration  status.  Labs of August 05, 2010, revealed sodium 140, potassium 3.97, chloride 102, CO2 30, BUN 7, creatinine 0.68, glucose of 110.  A UA/UC was done past admission as the patient was noted to have issues with urinary retention.  She was started on Flomax as well as Urecholine to help with her bladder voiding function.  Urine culture did show greater than 100,000 colonies of Escherichia coli and she was treated with Keflex x7 days.  Repeat UA was done on August 17, 2010, due to some question of a strong urine.  UA was noted to be positive and urine culture showed greater than 100,000 colonies of Escherichia coli.  She was started back on Keflex on August 18, 2010, and is to continue for 10 total days of antibiotic therapy past discharge.  The  patient and family have been educated regarding pushing fluids past discharge.  The patient's urinary retention initially was treated with Flomax and Urecholine.  The patient's since voiding symptoms improved, these medicines were tapered off.  Currently, the patient is voiding without any difficulty.  The patient was noted to have issues with lethargy as well as poor attention, easy distractibility.  She was started on Ritalin 5 mg p.o. b.i.d. with close monitoring of the patient's blood pressure and heart rate.  As blood pressures stable in the patient without any tachycardia, Ritalin was increased to 7.5 mg p.o. b.i.d.  Currently, she is tolerating this without difficulty with much improvement in attention as well as ability to carry out daily activities.  At the time of discharge, blood pressures are ranging from 110s-120s systolic, 60s-70s diastolic.  Heart rate has been stable in 70s-80s range.  The patient has been afebrile during this stay.  During the patient's stay in rehab, weekly team conferences were held to monitor the patient's progress, set goals, as well as discuss barriers to discharge.  At admission, the patient was noted to have decrease sustain attention, poor initiation, and perseveration, then tendency to perseverate on tasks.  She was noted to have decreased processing, poor thought organization, requiring max cues for sequencing, organizing, problem solving, decision making as well as self monitoring for errors. Speech Therapy has worked with the patient on improving awareness of deficits in regards to initiation as well as with perseveration.  They have also been working on attention, basic problem solving, as well as improving her ability to attend to left environment with min semantic cues.  Currently, the patient is able to express basic wants and needs. She is supervision for topic maintenance.  Supervision to occasional verbal cues to attend to task in a  mildly distractive environment for about 30 minutes for basic and familiar tasks.  She is also progressed to being at min assist level for basic problem solving with increase emergent awareness.  The patient's deficits in attention are biggest impediment on functional ability.  The patient to continue with further followup speech therapy past discharge.  OT has worked with the patient on self-care tasks.  At admission, the patient with left hemiparesis, left inattention, decreased safety awareness, motor perseveration with inconsistency poor problem-solving, affecting her ability to carry out a basic self-care tasks.  She was max assist for bathing and dressing. Currently, she has made great progress and is at supervision level for bathing and dressing.  She has progressed to utilizing left upper extremity as nondominant extremity spontaneously.  Grip strength in left hand is improved to 25 pounds.  Her left inattention is decreasing. Physical Therapy  has worked with the patient on impairments in mobility. The patient with decrease in sitting and standing balance, decrease motor planning, as well as motor impersistence on left side.  She was noted to have balance deficits with Berg score at 33/56.  The patient was max assist for gait trials of 25 feet with a rolling walker.  She was noted to be unsafe with rolling walker due to her left inattention, internal distractibility, as well as easy distractibility due to her external environment.  Physical Therapy has worked with the patient on balance deficits as well as overall safety with mobility.  Currently, the patient has improved in her balance deficits with Berg score at 39/56.  Her improvement in cognition and attention as awareness as well as ability to carry over have allowed the patient to progress to being at supervision level for all mobility.  She is currently ambulating greater than 250 feet with rolling walker in home  environment.  She is at supervision level for obstacle navigation.  She is close supervision for balance challenges.  Family education has been done with husband and son with focus on allowing the patient to have increased time and decreasing distraction as well as safe rolling walker use.  The patient is showing improved attention to left side and husband and family have been advised regarding cuing if they notice deficits in this.  Further followup home health PT, OT, Speech Therapy to continue initially past discharge.  They are to transition to outpatient therapy in the next couple of weeks.  On August 19, 2010, the patient was discharged home.  DISCHARGE MEDICATIONS: 1. Keflex 250 mg p.o. t.i.d. 2. Flector patch to sacrum b.i.d. 3. Ritalin 5 mg 1-1/2 p.o. at 7 a.m. and at noon daily, #90 Rx. 4. Ultram 50 mg p.o. q.6 hours p.r.n. headaches. 5. Prednisone 5 mg alternating with 7.5 every other day. 6. Tums 4 tabs b.i.d. 7. Colestid 2 g p.o. b.i.d. 8. Levoxyl 200 mcg 2 tablets on Tuesday, Thursday, Saturday, Sunday,     and 300 mg on Monday, Wednesday, Friday. 9. Lactase 50 mg 2 tabs t.i.d. before meals. 10.Multivitamin 1 per day. 11.Plaquenil 200 mg p.o. b.i.d. 12.Primidone 125 mg in a.m. and at noon and 250 mg at bedtime. 13.Tenormin 25 mg a day. 14.Vitamin D 1000 units 2 tab per day. 15.Vitamin C 500 mg 2 tabs per day. 16.Vitamin E 400 units a day. 17.Zoloft 100 mg p.o. per day.  DIET:  The patient advised to monitor carb-modified diet due to hemoglobin A1c of 5.8.  ACTIVITIES:  24-hour supervision.  No strenuous activity.  No driving.  SPECIAL INSTRUCTIONS:  No alcohol, no smoking.  Do not use Coumadin. [facility] to provide PT, OT, Speech Therapy, and RN  FOLLOWUP:  The patient to follow up with Dr. Wynn Banker on Sep 16, 2010, at 12:30 for 1 p.m. appointment.  Follow up with Dr. Earl Gala for routine check in 2 weeks.  Follow up with Dr. Pearlean Brownie on Dr. Anne Hahn in the  next 4- 6 weeks for routine check as well as repeat MRI testing.     Delle Reining, P.A.   ______________________________ Erick Colace, M.D.    PL/MEDQ  D:  08/19/2010  T:  08/20/2010  Job:  [id number]  cc:   Danise Edge, M.D. Pramod P. Pearlean Brownie, MD  Electronically Signed by Osvaldo Shipper. on 08/29/2010 02:47:35 PM Electronically Signed by Claudette Laws M.D. on 09/02/2010 06:10:03 AM

## 2010-09-05 ENCOUNTER — Ambulatory Visit: Payer: Medicare Other | Attending: Physical Medicine & Rehabilitation | Admitting: Occupational Therapy

## 2010-09-05 ENCOUNTER — Ambulatory Visit: Payer: Medicare Other | Admitting: Physical Therapy

## 2010-09-05 DIAGNOSIS — R41844 Frontal lobe and executive function deficit: Secondary | ICD-10-CM | POA: Insufficient documentation

## 2010-09-05 DIAGNOSIS — R4189 Other symptoms and signs involving cognitive functions and awareness: Secondary | ICD-10-CM | POA: Insufficient documentation

## 2010-09-05 DIAGNOSIS — Z8673 Personal history of transient ischemic attack (TIA), and cerebral infarction without residual deficits: Secondary | ICD-10-CM | POA: Insufficient documentation

## 2010-09-05 DIAGNOSIS — Z5189 Encounter for other specified aftercare: Secondary | ICD-10-CM | POA: Insufficient documentation

## 2010-09-05 DIAGNOSIS — R279 Unspecified lack of coordination: Secondary | ICD-10-CM | POA: Insufficient documentation

## 2010-09-05 DIAGNOSIS — M6281 Muscle weakness (generalized): Secondary | ICD-10-CM | POA: Insufficient documentation

## 2010-09-05 DIAGNOSIS — R269 Unspecified abnormalities of gait and mobility: Secondary | ICD-10-CM | POA: Insufficient documentation

## 2010-09-14 ENCOUNTER — Ambulatory Visit: Payer: Medicare Other | Admitting: *Deleted

## 2010-09-14 ENCOUNTER — Ambulatory Visit: Payer: Medicare Other | Attending: Physical Medicine & Rehabilitation | Admitting: Occupational Therapy

## 2010-09-14 DIAGNOSIS — R41844 Frontal lobe and executive function deficit: Secondary | ICD-10-CM | POA: Insufficient documentation

## 2010-09-14 DIAGNOSIS — R279 Unspecified lack of coordination: Secondary | ICD-10-CM | POA: Insufficient documentation

## 2010-09-14 DIAGNOSIS — Z8673 Personal history of transient ischemic attack (TIA), and cerebral infarction without residual deficits: Secondary | ICD-10-CM | POA: Insufficient documentation

## 2010-09-14 DIAGNOSIS — M6281 Muscle weakness (generalized): Secondary | ICD-10-CM | POA: Insufficient documentation

## 2010-09-14 DIAGNOSIS — Z5189 Encounter for other specified aftercare: Secondary | ICD-10-CM | POA: Insufficient documentation

## 2010-09-14 DIAGNOSIS — R269 Unspecified abnormalities of gait and mobility: Secondary | ICD-10-CM | POA: Insufficient documentation

## 2010-09-14 DIAGNOSIS — R4189 Other symptoms and signs involving cognitive functions and awareness: Secondary | ICD-10-CM | POA: Insufficient documentation

## 2010-09-15 NOTE — H&P (Signed)
NAMERICHEL, MILLSPAUGH NO.:  [id number]  MEDICAL RECORD NO.:  [id number]           PATIENT TYPE:  I  LOCATION:  4038                         FACILITY:  [facility]  PHYSICIAN:  Ranelle Oyster, M.D.DATE OF BIRTH:  06/20/1933  DATE OF ADMISSION:  08/02/2010 DATE OF DISCHARGE:                             HISTORY & PHYSICAL   CHIEF COMPLAINT:  Left-sided weakness.  GASTROENTEROLOGIST:  Danise Edge, MD  NEUROLOGIST:  Pramod P. Pearlean Brownie, MD  HISTORY OF PRESENT ILLNESS:  This is a 77 year old white female with breast cancer, colon cancer, rheumatoid arthritis with history of DVT and PE who recently underwent surveillance colonoscopy on July 26, 2010, with excision of colonic polyps.  She is admitted on July 27, 2010, with confusion and severe headache.  CT of the head showed 3.4 x 4.4 cm hemorrhage in the right frontal lobe with intraventricular hemorrhage and small amount of subarachnoid hemorrhage.  INR is 1.5. The patient was treated with fresh frozen plasma.  MRI/MRA of the brain showed large right frontal lobe hematoma with mass effect and small amount of intraventricular hemorrhage of indeterminate cause without aneurysm.  Neurology was consulted and felt it may be secondary to hypertension versus amyloid abnormality/malignancy.  MRI of the brain to be repeated in about a month.  The patient has had persistent left-sided weakness and problems with tension, perseveration, and initiation. Rehab was asked to consult and I saw her yesterday and felt she would benefit from an inpatient stay.  REVIEW OF SYSTEMS:  Notable for decreased hearing to the ear, diarrhea, recently incontinence, weakness, headache, pain in her left toe and her sacral region.  Full 12-point review is in the written H and P.  PAST MEDICAL HISTORY:  Positive for gastroparesis, cholecystectomy, rheumatoid arthritis, bilateral carpal tunnel release with right total knee replacement, left total  knee replacement with revision, bilateral total hip replacement, tremors, right bilateral mastectomies for cancer, right hemicolectomy for cancer, left rotator cuff tear repair, ORIF of right distal radius fracture, appendectomy, D and C, total thyroidectomy, emphysema, ACDF, lumbar laminectomy x2, D and C, depression, cryoablation of bilateral cranial nerves II, facet rhizotomies, anxiety disorder, first rib resection for thoracic outlet syndrome, right clavicle resection, bilateral ulnar nerve repositioning, Meniere disease, left great toe effusion, left Achilles tendon repair, history of sacral fracture,  squamous cell carcinoma of right ear, and thrombocytopenia.  FAMILY HISTORY:  Positive for breast cancer, colon cancer, and CAD.  SOCIAL HISTORY:  The patient is a retired Engineer, civil (consulting).  She quit tobacco in 1971.  She drinks a glass of wine per week and has a 1-level house, 3 steps to enter.  Her husband can provide supervision at discharge.  MEDICATIONS:  Please see written H and P.  ALLERGIES: 1. REGLAN. 2. DEMEROL. 3. NSAIDS. 4. COX-2 INHIBITORS. 5. FENTANYL. 6. PENTAZOCINE. 7. MORPHINE.  LABORATORY DATA:  Hemoglobin 12.2, white count 6.6, and platelets 135. Sodium 136, potassium 3.1, BUN 7, and creatinine 0.65.  PHYSICAL EXAMINATION:  VITAL SIGNS:  Blood pressure is 131/80, pulse is 71, respiratory rate 18, and temperature 98.1. GENERAL:  The patient is pleasant, a bit  flat at times. HEENT:  Pupils are equal, round, and reactive to light.  Ear, nose, and throat exam is notable for decreased hearing on the right side. NECK:  Supple without JVD or lymphadenopathy. CHEST:  Clear to auscultation bilaterally without wheezes, rales, or rhonchi. HEART:  Regular rate and rhythm without murmurs, rubs, or gallops. ABDOMEN:  Soft and nontender.  Bowel sounds are positive. SKIN:  Notable for multiple surgical incisions over the legs and to the first great toe of left foot.  She has  elbow incisions bilaterally.  She has tenderness and positive Tinel sign on the either ulnar groove. NEUROLOGIC:  Cranial nerves II-XII notable for decreased hearing on the right side.  She has impaired vision to left and nearly a left homonymous hemianopsia.  Reflexes are 1+.  Sensation is grossly intact in all 4 limbs.  Judgment and orientation a bit inhibited due to her lack of awareness and difficulties with apraxia, motor planning as well as higher level decision-making.  She is able to follow simple one-step commands.  Mood is very flat as noted above.  Strength in left upper extremity is 2+ to 3+/5 depending on effort.  Left lower extremity is again variable depending on effort and apraxia, generally 3-4/5.  Right upper and right lower extremity are generally 4/5 with apraxia again noted, but much lesser so.  POST-ADMISSION PHYSICIAN EVALUATION: 1. Functional deficit secondary to right frontal lobe hemorrhage with     left hemiparesis, left hemianopsia, motor planning issues on the     left as well as possibly some inattention. 2. The patient is admitted to receive collaborative interdisciplinary     care between the physiatrist, rehab nursing staff, and therapy     team. 3. The patient's level of medical complexity and substantial therapy     needs in context of that medical necessity cannot be provided at a     lesser intensity of care. 4. The patient has experienced substantial functional loss from her     baseline.  Upon functional assessment in the 24 hours, she is mod     to total assist bed mobility, +2 total assist to mod assist for     transfers, min assist ambulating 100 feet with rolling walker, min     assist upper body care, max assist lower body care, min assist     grooming.  Judging by the patient's diagnosis, physical exam, and     functional history, the patient has potential for functional     progress which will result in measurable gains while in inpatient      rehab.  These gains will be of substantial and practical use upon     discharge to home in facilitating mobility and self-care. 5. The physiatrist will provide 24-hour management of medical needs as     well as oversight of the therapy plan/treatment and provide     guidance as appropriate regarding interaction of the two.  Medical     problem list and plan are below number. 6. The 24-Hour Rehab Nursing Team will assist in management of the     patient's skin care needs as well as bowel and bladder function,     integration of therapy concept and techniques. 7. PT will assess and treat for lower extremity strength, range of     motion, functional mobility, safety, visual perceptual training,     motor planning, neuromuscular education with goals supervision to     min assist. 8. OT  will assess and treat for upper extremity use, ADLs, adaptive     techniques, equipment, functional mobility, safety, remote and     upper extremity strength, coordination, visual perceptual,     cognitive-perceptual training with goals modified independent to     occasional min assist. 9. Speech Language Pathology will follow and treat for any cognitive     deficits as well as apraxia, language issues with goals     supervision. 10.Case Management and Social Worker will assess and treat for     psychosocial issues and discharge planning. 11.Team conference will be held weekly to assess progress towards     goals and to determine barriers at discharge. 12.The patient has demonstrated sufficient medical stability and     exercise capacity to tolerate at least 3 hours of therapy per day     at least 5 days per week. 13.Estimated length of stay is approximately 2-3 weeks.  Prognosis is     fair to good.  MEDICAL PROBLEM LIST AND PLAN: 1. Deep vein thrombosis prophylaxis with sequential compression     devices and mobility as tolerated.  Anticoagulation contraindicated     due to bleeding. 2. Pain  management, p.r.n. Dilaudid. 3. Benign essential tremor:  Continue Mysoline t.i.d. per home     regimen. 4. Rheumatoid arthritis:  The patient is on Plaquenil b.i.d.     Prednisone was resumed.  We will monitor for pain management.  She     denies substantial pain today except at the ulnar grooves and at the     sacrum. She has some breakdown in her left great toe where she     has had recent surgery also which leads to pain. 5. Diarrhea:  Resume Colestid.  We will also add probiotic and     observe.  C. diff was recently negative. 6. Thyroid:  Continue supplementation. 7. History of pulmonary embolism and deep vein thromboses:  Mobility     will be encouraged.  Continue SCDs and TEDS.  Consider lower     extremity venous Dopplers if not already done as she's at high risk. 8. Headaches:  We will follow for now.  Depakote has been stopped due     to sedation and diarrhea per Primary Team.     Ranelle Oyster, M.D.     ZTS/MEDQ  D:  08/02/2010  T:  08/03/2010  Job:  [id number]  cc:   Danise Edge, M.D. Pramod P. Pearlean Brownie, MD  Electronically Signed by Faith Rogue M.D. on 09/15/2010 09:42:55 AM

## 2010-09-16 ENCOUNTER — Inpatient Hospital Stay: Payer: Medicare Other | Admitting: Physical Medicine & Rehabilitation

## 2010-09-16 ENCOUNTER — Encounter: Payer: Medicare Other | Attending: Physical Medicine & Rehabilitation

## 2010-09-16 DIAGNOSIS — G8110 Spastic hemiplegia affecting unspecified side: Secondary | ICD-10-CM

## 2010-09-16 DIAGNOSIS — G8929 Other chronic pain: Secondary | ICD-10-CM | POA: Insufficient documentation

## 2010-09-16 DIAGNOSIS — M069 Rheumatoid arthritis, unspecified: Secondary | ICD-10-CM | POA: Insufficient documentation

## 2010-09-16 DIAGNOSIS — I609 Nontraumatic subarachnoid hemorrhage, unspecified: Secondary | ICD-10-CM

## 2010-09-16 DIAGNOSIS — R51 Headache: Secondary | ICD-10-CM | POA: Insufficient documentation

## 2010-09-16 DIAGNOSIS — Z96649 Presence of unspecified artificial hip joint: Secondary | ICD-10-CM | POA: Insufficient documentation

## 2010-09-16 DIAGNOSIS — I619 Nontraumatic intracerebral hemorrhage, unspecified: Secondary | ICD-10-CM

## 2010-09-16 DIAGNOSIS — I69959 Hemiplegia and hemiparesis following unspecified cerebrovascular disease affecting unspecified side: Secondary | ICD-10-CM | POA: Insufficient documentation

## 2010-09-16 DIAGNOSIS — R41840 Attention and concentration deficit: Secondary | ICD-10-CM | POA: Insufficient documentation

## 2010-09-17 NOTE — Assessment & Plan Note (Signed)
[name] is a 77 year old female with complex past medical history. Her main complaint today is decreased mobility due to stroke.  She was admitted to inpatient rehab on August 02, 2010, discharged on August 19, 2010.  She made good progress while on inpatient rehab.  She had a right frontal lobe hemorrhage with small amount of subarachnoid hemorrhage and some intraventricular blood onset July 27, 2010.  Recommendation were to repeat MRI of the brain to reassess once the blood clears.  She has seen Dr. Anne Hahn in the past for benign essential tremor and plans to follow up with him soon.  She also has questions related to whether she should go back on any of her blood thinners.  She has a history of DVT and PE in the past but was taken off Coumadin due to her intracranial bleed.  Her other past history is significant for chronic pain due to her rheumatoid arthritis. She has had bilateral knee replacements, bilateral hip replacements, bilateral carpal tunnel release.  Other history significant for right hemicolectomy for cancer.  She has anxiety disorder, thoracic outlet syndrome, and Meniere disease.  PAST SURGICAL HISTORY:  Extensive, cholecystectomy, bilateral carpal tunnel release, bilateral total hips and knees, bilateral mastectomies, right hemicolectomy, left rotator cuff repair, right distal radius fracture ORIF, appendectomy, D and C, total thyroidectomy, bilateral first rib resection for thoracic outlet syndrome, ulnar nerve transposition, and left Achilles tendon repair.  Upon discharge from rehab, she was at a supervision to min assist level for ADLs and mobility.  She had problems with divided attention, was started on Ritalin which was helpful in inpatient.  Family wondering whether it is still needed.  She has been home since April 13, initially had some home health which she was disappointed in, but she has been quite happy with her outpatient therapy.  MEDICATION  LIST:  Reviewed.  REVIEW OF SYSTEMS:  Positive for headaches from time to time.  ALLERGIES:  Multiple, REGLAN, DEMEROL, PENTAZOCINE, FENTANYL, MORPHINE SULFATE, and NSAIDS.  PHYSICAL EXAMINATION:  NEUROLOGIC:  Visual fields are intact to confrontation, no evidence of left neglect.  Motor strength is 4/5 in left upper and left lower extremity and 5/5 on the right side.  Her gait is wide based.  She can walk very easily with her walker but without the walker still does some furniture walking or holding on to walls. Orientation is x3.  IMPRESSION: 1. Right frontal lobe hemorrhage, left hemiparesis.  Her left neglect     has improved. 2. Decreased attention and concentration due to frontal hemorrhage.     We will reduce her Ritalin from 7.5 to 5 q.a.m. and q. noon. 3. Intracranial hemorrhage.  Follow with Dr. Anne Hahn, will need another     MRI for brain. 4. Cerebrovascular accident prophylaxis.  Defer to Dr. Anne Hahn in terms     of resuming anticoagulants, i.e. antiplatelet agents versus     Coumadin.  Discussed the patient and her husband, agree with plan.     Erick Colace, M.D. Electronically Signed    AEK/MedQ D:  09/16/2010 15:32:30  T:  09/17/2010 02:44:16  Job #:  [id number]  cc:   Dr. Anne Hahn

## 2010-09-19 ENCOUNTER — Encounter: Payer: Medicare Other | Admitting: Occupational Therapy

## 2010-09-19 ENCOUNTER — Ambulatory Visit: Payer: Medicare Other | Admitting: *Deleted

## 2010-09-20 NOTE — Op Note (Signed)
NAMEJAVIANA, ANWAR NO.:  [id number]   MEDICAL RECORD NO.:  [id number]          PATIENT TYPE:  INP   LOCATION:  5034                         FACILITY:  [facility]   PHYSICIAN:  Doralee Albino. Carola Frost, M.D. DATE OF BIRTH:  03-19-1934   DATE OF PROCEDURE:  08/29/2008  DATE OF DISCHARGE:                               OPERATIVE REPORT   PREOPERATIVE DIAGNOSES:  1. Right displaced intra-articular distal radius fracture.  2. Ulnar styloid fracture.   POSTOPERATIVE DIAGNOSES:  1. Right displaced intra-articular distal radius fracture.  2. Ulnar styloid fracture.   PROCEDURE:  Open reduction and internal fixation of right intra-  articular distal radius fracture with numerous fragments.   SURGEON:  Doralee Albino. Carola Frost, MD   ASSISTANT:  Mearl Latin, PA   ANESTHESIA:  General.   TOURNIQUET:  None.   ESTIMATED BLOOD LOSS:  25 mL.   DISPOSITION:  To PACU.   CONDITION:  Stable.   BRIEF SUMMARY AND INDICATIONS FOR PROCEDURE:  [name] is a 77-year-  old right-hand dominant female who was loading flowers into her car  earlier today when she tripped over the parking block onto the curb  sustaining a blow to right side of her face, her right wrist, and left  knee.  X-rays of the knee did not reveal a fracture, but x-rays of the  right wrist showed a shortened displaced dorsally comminuted intra-  articular distal radius and ulnar styloid fracture.  Given the severe  angulation and shortening, the risk of loss of reduction if we were able  to obtain proper alignment with closed reduction would be very high.  The patient's INR value was 1.4 and acceptable to proceed with surgery.  Furthermore, the patient has a severe lymphedema with post-axillary node  dissection on the right side many years ago and this edema collects  around the elbow.  For these reasons, we felt that we should take  advantage of this early window with operative fixation to obtain and  maintain in an  acceptable position while allowing for early motion of  the elbow and avoidance of complications from immobilization in a sugar-  tong splint.  Also, of course, the possibility of having to reverse her  anticoagulation in the future given her history of 3 DVTs and PEs.  I  discussed this with the patient who did wish to proceed with surgical  fixation.  She understood the risks to include infection, nerve injury,  vessel injury, abnormality, or crisis associated with prolonged steroid  administration, infection, need for further surgery, and multiple  others.  These included nonunion and malunion, DVT, PE, heart attack,  and stroke.   BRIEF SUMMARY OF PROCEDURE:  [name] was administered preop  antibiotics, taken to the operating room where general anesthesia was  induced.  Her right upper extremity was prepped and draped in the usual  sterile fashion.  Tourniquet was placed about the upper arm, but never  inflated during the procedure again because of the lymphedema and also  increased pain that could accompany use of the tourniquet.  Standard  volar  Sherilyn Cooter approach was made through a 4-cm incision carrying  dissection down to the SCR tendon sheath.  Tendon was retracted  radially, the bottom of the sheath incised, dissection continued deep  where the pronator was released from the radial side.  Hohmann  retractors were placed medially and laterally.  Two towels were placed  under the wrist.  A reduction maneuver performed and standard DVR plate  positioned with two K-wires.  X-rays demonstrated appropriate plate  position but with some loss of the volar tilt.  Consequently, the plate  was adjusted bringing up the proximal end of the plate, then placing  fixation into the subchondral segment such that when the proximal screw  was placed in the plate and brought down to the bone.  This resulted in  appropriate restoration of volar tilt.  This was confirmed on AP and  lateral images.   A combination of terminally threaded pegs and smooth  pegs were placed given the comminution and 3 metaphyseal screws.  Final  images showed restoration of length to an inclination with excellent  congruity of the joint surface and also approximation of the ulnar  styloid.  Wound was copiously irrigated and closed in standard layered  fashion using a 2-0 Vicryl for the pronator fascia, 3-0 nylon for the  subcu, and a sterile gently compressive volar splint dressing.  Montez Morita, PA-C, assisted throughout the procedure and was necessary to  safely retract the tendons, nerves, and radial artery and also to  provide reduction and maintain it during instrumentation.  This case  could not have been completed safely and effectively without such  assistance.  The patient was then awakened from anesthesia and  transported to the PACU in stable condition.   PROGNOSIS:  [name] will be nonweightbearing through the right  wrist.  We anticipate admission for 23 hours and consultation with PT  and OT for edema control, motion, and perhaps use of a platform walker  given the left knee pain.  She should be enable to bear weight with  therapy.  We may repeat her x-rays.  She remains at increased risk of  perioperative and postoperative complications given her medical problems  in particular history of thromboembolic disease.  Because of this, she  should be on Lovenox as well as Coumadin titrating that to an effective  dosage.      Doralee Albino. Carola Frost, M.D.  Electronically Signed     MHH/MEDQ  D:  08/29/2008  T:  08/30/2008  Job:  [id number]

## 2010-09-20 NOTE — H&P (Signed)
[name], [name] NO.:  [id number]   MEDICAL RECORD NO.:  [id number]          PATIENT TYPE:  INP   LOCATION:  5034                         FACILITY:  [facility]   PHYSICIAN:  Doralee Albino. Carola Frost, M.D. DATE OF BIRTH:  April 26, 1934   DATE OF ADMISSION:  08/29/2008  DATE OF DISCHARGE:                              HISTORY & PHYSICAL   REASON FOR CONSULTATION:  Fall with right wrist fracture.   REQUESTING PHYSICIAN:  Celene Kras, MD., EDP   HISTORY OF PRESENT ILLNESS:  [name] is a very pleasant 78 year old  Caucasian female who states that she was out at the [facility] earlier this morning when she was putting her purchases into her  vehicle and she subsequently tripped over the cement parking block in  the parking lot and fell on her right hand.  The patient states that she  tried to catch herself with her arm when she fell, but sustained the  injury to her right wrist.  The patient is right-hand dominant as well.  The patient had immediate onset of pain and deformity.  The patient was  picked up by EMS and was brought to [facility] for  evaluation.  Evaluation determined that [name] had an  intraarticular distal radius fracture on her right side and then there  were additional upper extremity findings.  The patient did also strike  her left knee when she fell, but initial radiograph in the ED  demonstrated no acute findings.  She did have an abrasion over her left  knee as well.  Currently, [name] is a resting comfortably in the  ED.  Her arm is in one of a blue Velcro splint and is resting across her  abdomen.  She complains primarily of pain in her right wrist which is  approximately 7-8/10.  She does get relief with IV pain medication as  well as leaving her arm in a stationary position.  She denies any  numbness or tingling in her right upper extremity.  No numbness or  tingling noted elsewhere.  She denies  additional injuries except for  those noted above.  The pain has remained relatively constant since the  initial accident as well.   PAST MEDICAL HISTORY:  Significant for;  1. Breast cancer, status post bilateral mastectomy.  2. Colon cancer, status post partial colectomy.  3. Hypothyroidism.  4. Rheumatoid arthritis.  5. Osteoarthritis.  6. Ankylosing spondylitis.  7. Meniere disease.  8. History of multiple pulmonary embolism.  9. Multiple DVTs.  10.Bilateral carpal tunnel syndrome, status post releases bilaterally.  11.History of lymphedema in right arm, secondary to tonsillectomy.  12.Thrombocytopenia.  13.Gastroparesis.  14.Hearing loss in right ear.  15.Central tremor.  16.Essential tremor.   PAST SURGICAL HISTORY:  1. Tonsils and adenoids.  2. Left ankle surgery in 1954, ruptured tendon.  3. D&C in November 1999, spontaenous abortion.  4. Appendectomy in 1952.  5. D&C for retained placenta in 1953.  6. Right carpal tunnel release in 1954.  7. Thoracic outlet syndrome with bilateral first rib resection.  8. Left carpal tunnel in 1977.  9. Right ulnar nerve transposition in January 1977.  10.Cholecystectomy in 1997.  11.Ulnar nerve transposition in 1980.  12.Right knee arthroscopy in June 1984.  13.Right knee arthroscopy in July 1984.  14.Left ankle surgery in August 1984.  15.Left knee arthroscopy in 1984.  16.Right breast biopsy in October 1984.  17.Right breast lumpectomy in November 1984.  18.Right axillary node resection in November 1984.  19.Radiation therapy right breast in December 1984 to February 1985.  20.Left ankle surgery rupture tendon in 1985.  21.Right clavicle resection.  22.Left breast biopsy, malignant in January 1986.  23.Left breast mastectomy with primary resection in February 1986.  24.Left neck node biopsy in May 1986.  25.Left breast reconstruction in September 1986.  26.Right total knee replacement in March 1987.  The patient had PE       after the surgery.  27.D and C in November 1987.  28.Left breast implant replacement in November 1987.  29.Right shoulder arthroscopy and rotator cuff tear in 1980.  30.Lumbar laminectomy in March 1988.  31.Maxillary endoscopy in June 1988.  32.Anterior cervical fusion C5-6 and C6-7 in August 1989.  33.Left breast implant replacement in 1990.  34.Lumbar laminectomy in June 1990.  35.Facet rhizotomy in June 1990.  36.Left TKR in February 1991.  37.Left shoulder arthroscopy in August 1991.  38.Total lumbar fusion in November 1991  39.Left knee arthroscopy in January 1992.  40.Left knee arthroscopy in February 1992.  41.Left wrist replacement, removal in February 1993.  42.Revision of left total lumbar fusion in April 1995.  43.Right carpal tunnel release in September 1996.  44.Left carpal tunnel release in November 1996.  45.Left rotator cuff repair in October 1996.  46.Right greater toe bunionectomy in June 1997.  47.Total thyroidectomy in January 1998.  48.Right total hip arthroplasty in July 1998.  49.Revision of right total knee arthroplasty in July 2000.  50.Left hip arthroplasty in November 2000.  51.Left total knee revision in November 2006.  52.Right hemicolectomy in February 2007.   FAMILY HISTORY:  Brain cancer, breast cancer, and prostate cancer.   SOCIAL HISTORY:  The patient is a retired Engineer, civil (consulting).  Lives in [place]  with her husband.  Denies tobacco use.  Occasional alcohol use.   ALLERGIES:  The patient reports allergies to DEMEROL, REGLAN, FENTANYL  all of which cause hallucinations.  She also reports allergy to MORPHINE  which causes a rash.  Previous MORPHINE, may be a true allergy and  remaining 3 insensitivities.   MEDICATIONS:  1. Levoxyl 300 mcg 1 p.o. daily.  2. Plaquenil 200 mg p.o. b.i.d.  3. Tenormin 25 mg p.o. daily.  4. Prednisone 7.5 mg p.o. daily.  5. Femara 2.5 mg p.o. daily.  6. Coumadin 5 mg Monday, Wednesday, and Friday.  7. Coumadin 2.5 mg  Tuesday and Thursday.  8. Colestid 2 p.o. b.i.d.  9. Vitamin D 50,000 units once weekly on Sunday.  10.Vitamin C 1000 mg p.o. daily.  11.Vitamin E 400 international units p.o. daily.  12.Zoloft 100 mg p.o. daily.  13.Dilaudid 2 mg p.o. 4 times a day p.r.n. pain.  14.Primidone 125 mg p.o. b.i.d.  15.Tums 2 tabs b.i.d.  16.Multivitamins p.o. daily.   REVIEW OF SYSTEMS:  Positive for right-sided wrist pain and left knee  pain.   PHYSICAL EXAMINATION:  VITAL SIGNS:  Temperature 99.7, heart rate 66,  respiration 18, and 98% on room air, blood pressure is 119/66.  GENERAL:  This patient appears well, in no acute  distress, accompanied  by her husband.  HEENT:  Positive scalp abrasion.  Intraoral laceration.  Extraocular  muscles are intact.  Mucous membranes are noted.  NECK:  Supple.  No lymphadenopathy.  Full active range of motion.  No  spinous process tenderness is noted.  CHEST:  Clear to auscultation bilaterally.  BREAST:  Deferred.  HEART:  S1, S2.  Right upper extremity lymphedema appreciated.  ABDOMEN:  Soft.  Positive bowel sounds.  Positive scar.  PELVIS:  Nontender.  No instability appreciated.  GENITOURINARY:  Deferred.  EXTREMITIES:  Left upper extremity and right lower extremity are free of  gross deformities.  No acute findings.  No crepitus or blocked motion  was ranging at the joints.  Soft tissue is nontender to palpation.  Distal motor and sensory functions are intact.  Vascular status is also  intact.  No acute findings are appreciated.  Right upper extremity, the  patient's arm is in a blue foam immobilizer.  Gross deformity is  appreciated with significant swelling.  Decreased sensation is noted  over the thumb.  However, gross examination of radial, ulnar median  neurosensory functions are intact.  Axillary neurosensory functions also  intact.  Radial, ulnar, median, anterior interosseus, and posterior  interosseus nerves motor function is intact, however,  somewhat  compounded by pain.  Palpable radial pulses appreciated.  Brisk  capillary refill is noted.  No tenderness to palpation over the elbow.  No tenderness to palpation of the shoulder or clavicle as well.  SKIN:  Skin has appropriate color and temperature.  No open lesions or  wounds are noted.  Examination of the left lower extremity free of gross  deformities.  No tenderness to palpation of the hip or ankle.  The  patient is able to move her hip and ankle well.  She does complain of  some diffuse anterior knee pain along the distal aspect of femur,  patella, and proximal aspect of the tibia.  There is a mild joint  effusion appreciated.  Distal sensory functions along the deep peroneal  nerve, superficial peroneal nerve, tibial nerve, and femoral nerve were  intact grossly.  EHL, FHL, anterior tibialis, posterior tibialis,  peroneal's, and gastroc soleus complexes, motor function is also intact.  The patient does have some difficulty performing straight leg raise, but  does have active quad contraction.  The patient also does have  difficulty bending her knee to perform a heel side but does get up the  bed somewhat.  There is no deep calf tenderness.  Compartments are soft  and nontender.  Palpable dorsalis pedis pulses are appreciated.  Distal  skin is appropriate color and temperature.  No open wounds are  noted.  There are abrasions over the anterior aspect of the knee, however.   LABORATORY DATA:  Sodium is 139, potassium 3.6, chloride 102, bicarb 29,  BUN 10, creatinine 0.64, glucose 104.  CBC; white blood cell is 5.4,  hemoglobin 13.4, hematocrit 39.2, and platelets are 119.  INR is 1.4.  X-  ray of left knee, status post left total knee arthroplasty, components  are well fixed.  No acute osseous findings are noted.  Right wrist x-  rays that demonstrate a complex intraarticular radius fracture with  dorsal tilt and again intraarticular comminution.  CT of the head and   face, no intracranial abnormalities or hemorrhage.  There is a positive  right periorbital edema, but no underlying fracture.   ASSESSMENT AND PLAN:  This is a 77 year old  female with significant  orthopedic history, and cancer history along with history of multiple  PEs and DVTs, on chronic Coumadin therapy who sustained a ground-level  fall this morning resulting in a right intraarticular distal radius  fracture and left knee contusion and lip laceration.  1. Right intraarticular distal radius fracture.  The patient is right-      hand dominant, highly independent.  There is significant loss of      height and inclination with approximately 25 degrees of dorsal      tilt.  This is an orthopedic trauma associated classification OTA      23-C2 .  We will proceed to the OR now for formal ORIF for right      distal radius.  2. Admit overnight for observation.  Pain control, possible discharged      home tomorrow morning the pain is controlled and the patient is      stable.  3. Left knee contusion, total knee components are stable.  No acute      findings.  We will continue with ice and elevation as needed.  4. Oral laceration, repaired by EDP, stable.  5. Medical issues.  Continue home medication, appear stable.  Given      her history of chronic steroid therapy, we will administer stress      dose steroids x2 days.  6. Deep vein thrombosis prophylaxis.  We will start Lovenox and      Coumadin after surgery.  Lovenox will serve as a bridge until INR      is therapeutic.  The patient will follow up with PCP after      discharge to follow INR.  We will also arrange home health to      monitor as well.  7. Fluids, electrolytes, and nutrition n.p.o. now.  Regular diet after      surgery, we will continue IV fluid at 70 mL an hour after surgery.      A Foley to be placed in the OR and discontinue on postoperative day      #1.  8. Disposition to the OR now for ORIF right distal radius.   Ancef 1 g      IV on call to the OR.  Risks and benefits of surgery were explained      to the patient, demonstrates understanding and wishes to proceed.   The patient was seen and examined by Dr. Carola Frost.      Mearl Latin, PA      Doralee Albino. Carola Frost, M.D.  Electronically Signed    KWP/MEDQ  D:  08/30/2008  T:  08/31/2008  Job:  [id number]

## 2010-09-21 ENCOUNTER — Ambulatory Visit: Payer: Medicare Other | Admitting: Occupational Therapy

## 2010-09-21 ENCOUNTER — Ambulatory Visit: Payer: Medicare Other | Admitting: *Deleted

## 2010-09-23 ENCOUNTER — Ambulatory Visit: Payer: Medicare Other | Admitting: *Deleted

## 2010-09-23 ENCOUNTER — Encounter: Payer: Medicare Other | Admitting: Occupational Therapy

## 2010-09-23 NOTE — Discharge Summary (Signed)
[name], STALLONE NO.:  [id number]   MEDICAL RECORD NO.:  [id number]          PATIENT TYPE:  INP   LOCATION:  5034                         FACILITY:  [facility]   PHYSICIAN:  Doralee Albino. Carola Frost, M.D. DATE OF BIRTH:  1933/12/19   DATE OF ADMISSION:  08/29/2008  DATE OF DISCHARGE:  08/31/2008                               DISCHARGE SUMMARY   DISCHARGE DIAGNOSES:  1. Right displaced intraarticular distal radius fracture.  2. Right ulnar styloid fracture.  3. Left knee contusion.  4. Intraoral laceration, which was repaired by the emergency      department physician.  5. Cervical strain.   ADDITIONAL DISCHARGE DIAGNOSES:  1. Hypothyroidism.  2. Rheumatoid arthritis.  3. Osteoarthritis.  4. Ankylosing spondylitis.  5. Meniere disease.  6. History of multiple pulmonary embolisms.  7. History of multiple deep venous thrombosis.  8. Bilateral carpal tunnel syndrome, status post bilateral release.  9. History of lymphedema, right arm.  10.History of thrombocytopenia.  11.History of gastroparesis.  12.Hearing loss, right ear.  13.Central tremor.  14.History of breast cancer, status post bilateral mastectomy.  15.History of colon cancer, status post partial colectomy.   PROCEDURE PERFORMED:  On August 29, 2008, ORIF, right intraarticular  distal radius fracture.   BRIEF HISTORY AND HOSPITAL COURSE:  [name] is a very pleasant 37-  year-old right-hand dominant female who was shopping on the morning of  August 29, 2008, at [facility] when she was loading flowers into  a car and tripped over the parking lot on to the curve sustaining a  direct injury to her face and her right wrist as well as her left knee.  The patient was brought to the [facility] for evaluation, which  eventually demonstrated a shortened, displaced dorsally comminuted  intraarticular distal radius fracture and ulnar styloid fracture of the  right wrist.  X-rays of the knee did  not reveal acute fracture with  stable total knee components in place.  We were consulted for orthopedic  evaluation and management.  We did discuss multiple options with the  patient both operative and nonoperative treatment, but given a severe  comminution and displacement, we strongly recommended open reduction and  internal fixation, which the patient and her husband agreed too.  Of  note, the patient is also on chronic Coumadin therapy given her history  of multiple DVTs and PEs; however, her INR was at a level of 1.4, which  was acceptable to proceed with surgery and as such, we did proceed the  surgery that day.  [name] went to the OR for the procedure described  up above and tolerated the procedure very well without any acute issues.  After a brief stay in the PACU for recovery from anesthesia, the patient  was transported to the Orthopedic floor for continued observation and  pain control.  Ms. [facility] hospital stay was relatively uncomplicated,  did last 2 days in length.  On postoperative day 1, she was doing fairly  well, however, was still requiring IV pain medication for pain control  and was having some  difficulty mobilizing with physical therapy.  As  such, we decided that it would be best for her to remain in the hospital  for another day to work with therapy and to achieve better pain control.  On postoperative day 2, the patient was deemed stable for discharge to  home with supervision from her husband.  Of note, the patient was  started on Coumadin and Lovenox for DVT prophylaxis given her suboptimal  INR level.  However, on postop day 1, her platelet level was noted to be  86 and therefore her Lovenox therapy was held, but Coumadin was  continued.   Clinical encounter note for postoperative day #2;  Subjective/Objective:  The patient  is doing much better, wants to go  home today.  No chest pain.  No shortness of breath.  No nausea,  vomiting, or diarrhea.    PHYSICAL EXAMINATION:  VITAL SIGNS:  Temperature 100.0, heart rate 91,  respirations 19 at 95% on room air, and BP is 130/59.  GENERAL:  The patient is awake, alert, more comfortable, and in no acute  distress.  HEENT:  Positive swelling, right side of the face, abrasions noted.  Cranial nerves are symmetric.  NECK:  Some soft tissue tenderness is noted.  LUNGS:  Clear.  CARDIAC:  S1 and S2.  ABDOMEN:  Positive bowel sounds.  Nontender.  EXTREMITIES:  Right upper extremity splint is clean, dry, and intact.  Radial, ulnar, median, and sensory functions are intact.  Radial, ulnar,  median, anterior interosseous, and posterior interosseous nerve motor  function is intact.  No pain with passive range of motion of the digits.  Brisk capillary refill is noted.  Left knee, there is a mild effusion  mand the patient does have tenderness to palpation, medial greater than  lateral.  No motor or sensory disturbances are noted.  No deep calf  tenderness is noted.   LABORATORY DATA:  Sodium 142, potassium 3.2, chloride 107, bicarb 29,  BUN 5, creatinine 0.53, glucose 118, H and H 11.8 and 33.3.  White blood  cell 6.4, platelets 92, and INR is 1.7.   ASSESSMENT AND PLAN:  A 77 year old female status post fall and open  reduction and internal fixation, right distal radius, left knee  contusion, facial laceration and neck strain.  1. Right distal radius, nonweightbearing right wrist.  PT/OT, sling      for comfort, they did range of motion, elbow and shoulder range of      motion.  2. Left knee contusion.  We will repeat left knee x-ray prior to      discharge, but continue with ice as needed.  Range of motion of      left knee encouraged.  3. Neck strain/headache.  Last night's repeat CT scan did not show any      acute findings and the patient did strike her face.  Pain likely is      related to soft tissue strain and sprain.  Continue to monitor,      Robaxin, and pain control.  4. Oral  laceration is repaired and stable.  5. Medical issues.  Continue home medications.  6. Fluids, electrolytes, nutrition.  Hypokalemia, potassium chloride      20 mEq now and 20 mEq before discharge.  7. Deep vein thrombosis prophylaxis.  We will continue with Coumadin;      however, we will continue to hold Lovenox as her platelets are      continuing to be below 100,000.  8. Disposition.  Likely discharge home today.  We will work with      physical therapy today prior to discharge.   Important note, on the night of postoperative day #1, during the night,  we were paged regarding the patient experiencing increased headache and  difficulty opening the mouth.  Reports stated that the patient did not  have any obvious motor deficits.  However, given the mechanism of her  injury and the patient striking her head on the ground, we did repeat CT  scan.  Reading and evaluation of CT scan did not demonstrate any acute  bleeds or other acute findings suggestive of intracranial pathology.  We  were concerned given the patient's history of DVT, PE that she may have  been coagulopathic.  However, after obtaining CT scan, we did believe  that her pain was related to facial trauma and not related to  intracranial pathology.   MEDICATIONS:  1. Norco 5/325 one to two p.o. q.4-6 h. as needed for pain.  2. Oxycodone 5 mg 1-2 p.o. q.3 h. for breakthrough pain as needed.  3. Robaxin 500 mg 1-2 p.o. q.6 h. as needed.  4. Coumadin 5 mg to take as directed per pharmacy protocol for an INR      between 2 and 3.   The patient may resume her home medications as follows,  1. Levoxyl 300 mcg 1 p.o. daily.  2. Plaquenil 200 mg p.o. b.i.d.  3. Tenormin 25 mg p.o. daily.  4. Prednisone 7.5 mg p.o. daily.  5. Femara 2.5 mg p.o. daily.  6. Colestid 2 p.o. b.i.d.  7. Vitamin D 50,000 units once weekly on Sunday.  8. Vitamin C 1000 mg p.o. daily.  9. Vitamin E 400 international units p.o. daily.  10.Zoloft 100  mg p.o. daily.  11.Dilaudid 2 mg p.o. 4 times a day p.r.n. pain.  12.Primidone 125 mg p.o. b.i.d.  13.Tums 2 tabs p.o. b.i.d.  14.Multivitamins p.o. daily.   DISCHARGE INSTRUCTIONS AND PLANS:  Ms. Gashi did sustain a significant  injury to her right wrist; however, we were able to achieve excellent  fixation with open reduction and internal fixation.  She would been  nonweightbearing through her right wrist for the next 6 weeks.  We will  see her back in 2 weeks for reevaluation at which time we will remove  her volar splint and place her into a short arm cast.  We will also  obtain x-rays of her wrist to ensure appropriate healing and continued  alignment and remove her sutures at that time.  We encouraged Ms.  Dalporto to perform digit range of motion activities to help with edema  control.  She has unrestricted motion of her shoulder, elbow, and digit  as well.  We have arranged for home health PT/OT to help the patient  mobilize and to ensure safe environment.  We also did obtain a platform  walker to help Ms. Toppins mobilize given the left knee pain coupled  with the right upper extremity injury.  She can continue to bare weight  as tolerated through her left knee as repeat films prior to discharge  were negative.  Given her best coagulopathic history, she does continue  to remain at risk for involvement of thromboembolic complications.  As  such, she will continue on Coumadin for DVT prophylaxis as she is on  chronic Coumadin therapy to begin with.  The patient will follow up in  our office in 2 weeks as well.  She should contact the office with  questions or concerns.  She should practice good splint care and keep  the splint clean and dry.  It does not need to be removed until she  follows up with our office.  Should they have any questions, they are to  contact the office immediately and we will address the issues at hand.  I do recommend that Ms. Azam follow up with her  primary care doctor  within a week or 2 just to make them aware of her recent hospitalization  as well.  Given the patient's history of lymphedema in her right upper  extremity, I have encouraged  her to continue with utilizing Ace wraps on her entire right arm to help  control any residual swelling secondary to her acute injury.  We will  also have occupational therapy review additional technique that the  patient can use to help control the swelling as well.      Mearl Latin, PA      Doralee Albino. Carola Frost, M.D.  Electronically Signed    KWP/MEDQ  D:  09/04/2008  T:  09/05/2008  Job:  [id number]   cc:   Theressa Millard, M.D.  Sigmund I. Patsi Sears, M.D.

## 2010-09-28 ENCOUNTER — Ambulatory Visit: Payer: Medicare Other | Admitting: *Deleted

## 2010-09-28 ENCOUNTER — Ambulatory Visit: Payer: Medicare Other | Admitting: Occupational Therapy

## 2010-09-30 ENCOUNTER — Ambulatory Visit: Payer: Medicare Other | Admitting: Occupational Therapy

## 2010-09-30 ENCOUNTER — Ambulatory Visit: Payer: Medicare Other | Admitting: *Deleted

## 2010-10-05 ENCOUNTER — Ambulatory Visit: Payer: Medicare Other | Admitting: *Deleted

## 2010-10-05 ENCOUNTER — Ambulatory Visit: Payer: Medicare Other | Admitting: Occupational Therapy

## 2010-10-07 ENCOUNTER — Ambulatory Visit: Payer: Medicare Other | Attending: Physical Medicine & Rehabilitation | Admitting: Occupational Therapy

## 2010-10-07 ENCOUNTER — Ambulatory Visit: Payer: Medicare Other | Admitting: *Deleted

## 2010-10-07 DIAGNOSIS — Z8673 Personal history of transient ischemic attack (TIA), and cerebral infarction without residual deficits: Secondary | ICD-10-CM | POA: Insufficient documentation

## 2010-10-07 DIAGNOSIS — Z5189 Encounter for other specified aftercare: Secondary | ICD-10-CM | POA: Insufficient documentation

## 2010-10-07 DIAGNOSIS — M6281 Muscle weakness (generalized): Secondary | ICD-10-CM | POA: Insufficient documentation

## 2010-10-07 DIAGNOSIS — R41844 Frontal lobe and executive function deficit: Secondary | ICD-10-CM | POA: Insufficient documentation

## 2010-10-07 DIAGNOSIS — R279 Unspecified lack of coordination: Secondary | ICD-10-CM | POA: Insufficient documentation

## 2010-10-07 DIAGNOSIS — R4189 Other symptoms and signs involving cognitive functions and awareness: Secondary | ICD-10-CM | POA: Insufficient documentation

## 2010-10-07 DIAGNOSIS — R269 Unspecified abnormalities of gait and mobility: Secondary | ICD-10-CM | POA: Insufficient documentation

## 2010-10-12 ENCOUNTER — Ambulatory Visit: Payer: Medicare Other | Admitting: Physical Therapy

## 2010-10-12 ENCOUNTER — Ambulatory Visit: Payer: Medicare Other | Admitting: Occupational Therapy

## 2010-10-14 ENCOUNTER — Ambulatory Visit: Payer: Medicare Other | Admitting: Occupational Therapy

## 2010-10-18 ENCOUNTER — Ambulatory Visit: Payer: Medicare Other | Admitting: Physical Medicine & Rehabilitation

## 2010-10-18 ENCOUNTER — Encounter: Payer: Medicare Other | Attending: Physical Medicine & Rehabilitation

## 2010-10-18 DIAGNOSIS — R41840 Attention and concentration deficit: Secondary | ICD-10-CM | POA: Insufficient documentation

## 2010-10-18 DIAGNOSIS — M069 Rheumatoid arthritis, unspecified: Secondary | ICD-10-CM | POA: Insufficient documentation

## 2010-10-18 DIAGNOSIS — G8929 Other chronic pain: Secondary | ICD-10-CM | POA: Insufficient documentation

## 2010-10-18 DIAGNOSIS — G8110 Spastic hemiplegia affecting unspecified side: Secondary | ICD-10-CM

## 2010-10-18 DIAGNOSIS — I609 Nontraumatic subarachnoid hemorrhage, unspecified: Secondary | ICD-10-CM

## 2010-10-18 DIAGNOSIS — Z96649 Presence of unspecified artificial hip joint: Secondary | ICD-10-CM | POA: Insufficient documentation

## 2010-10-18 DIAGNOSIS — I619 Nontraumatic intracerebral hemorrhage, unspecified: Secondary | ICD-10-CM

## 2010-10-18 DIAGNOSIS — I69959 Hemiplegia and hemiparesis following unspecified cerebrovascular disease affecting unspecified side: Secondary | ICD-10-CM | POA: Insufficient documentation

## 2010-10-18 DIAGNOSIS — R51 Headache: Secondary | ICD-10-CM | POA: Insufficient documentation

## 2010-10-19 ENCOUNTER — Ambulatory Visit: Payer: Medicare Other | Admitting: Occupational Therapy

## 2010-10-19 ENCOUNTER — Ambulatory Visit: Payer: Medicare Other | Admitting: Physical Therapy

## 2010-10-19 NOTE — Group Therapy Note (Signed)
REASON FOR VISIT:  Left-sided weakness due to right intracranial hemorrhage.  She also would like to establish with pain management.  A 77 year old female with complex past medical history.  She has history of right frontal lobe hemorrhage with left hemiparesis, left neglect has improved.  She continues with physical therapy, occupational therapy at [facility] on [address].  She has not followed up with Dr. Anne Hahn yet. She has seen by Dr. Luiz Blare who put her on Neurontin for her toe pain which he thought it was due to "pinched nerve."  Her toe pain seems to be better.  Her walking tolerance is 30 minutes at the current time.  She can climb steps.  She does not drive.  She needs help with shopping, but otherwise independent.  REVIEW OF SYSTEMS:  Positive for trouble walking, tremor, dizziness, confusion, depression, anxiety, weight gain and shortness of breath.  PAST MEDICAL HISTORY:  Borderline diabetes.  PAST SURGICAL HISTORY:  Rotator cuff repair in 2004, hip replacement on the left 2000, cervical fusion, right total hip replacement 1984, carpal tunnel surgery in 1964, thyroidectomy in 1998, cholecystectomy in 1977, right total knee 1987, laminectomy in 1988, fusion in 1989, facet rhizotomy in 1990, knee replacement on the left in 1991, shoulder surgery in 1991 and knee arthroscopy in 1992.  She has been released from [facility].  MEDICATION ALLERGIES:  MORPHINE causing rash, REGLAN causing "crazy feeling," TALWIN resulting in hallucinations.  PHYSICAL EXAMINATION:  VITAL SIGNS:  Blood pressure 125/55 pulse 67, respirations 18 and O2 sat 96% on room air.  Her sleep is good.  No falls. GENERAL:  She is a comfortable, smiling woman, in no acute distress. EYES:  Anicteric, noninjected. ENT:  External ENT normal. EXTREMITIES:  Motor strength is 4/5 in the left deltoid, biceps, triceps and grip as well as left hip flexion, knee extension, ankle dorsiflexion; 5/5 on  the right side.  She has evidence of subcu atrophy just posterior to the greater trochanter on the right side.  IMPRESSION: 1. Right intracranial hemorrhage, left hemiparesis recovering nicely. 2. Chronic pain, has a history of rheumatoid arthritis multiple joint     replacements, chronic lumbar pain as well as post laminectomy     syndrome as well as post cervical laminectomy syndrome.  She seems     to be maintaining quite well on tramadol.  I really would not want     to put her back on her Dilaudid because of her fall risk and her     good functional status and potential cognitive dysfunction from     narcotic analgesics.  Discussed with the patient and her husband agree with plan.  I will see her back in another month to monitor therapy progress and once she finishes out outpatient therapy, I will see her on a less frequent basis, i.e. about every 4 months or so.     Erick Colace, M.D. Electronically Signed    AEK/MedQ D:  10/18/2010 16:31:47  T:  10/19/2010 05:48:27  Job #:  [id number]  cc:   [facility]  Theressa Millard, M.D. Fax: [phone]  C. Lesia Sago, M.D. Fax: [phone]  Harvie Junior, M.D. Fax: [phone]

## 2010-10-21 ENCOUNTER — Ambulatory Visit: Payer: Medicare Other | Admitting: Occupational Therapy

## 2010-10-21 ENCOUNTER — Ambulatory Visit: Payer: Medicare Other | Admitting: *Deleted

## 2010-10-24 ENCOUNTER — Ambulatory Visit: Payer: Medicare Other | Admitting: Physical Therapy

## 2010-10-24 ENCOUNTER — Ambulatory Visit: Payer: Medicare Other | Admitting: Occupational Therapy

## 2010-10-28 ENCOUNTER — Ambulatory Visit: Payer: Medicare Other | Admitting: Physical Therapy

## 2010-10-28 ENCOUNTER — Ambulatory Visit: Payer: Medicare Other | Admitting: Occupational Therapy

## 2010-11-02 ENCOUNTER — Ambulatory Visit: Payer: Medicare Other | Admitting: Occupational Therapy

## 2010-11-02 ENCOUNTER — Ambulatory Visit: Payer: Medicare Other | Admitting: Physical Therapy

## 2010-11-04 ENCOUNTER — Encounter (HOSPITAL_BASED_OUTPATIENT_CLINIC_OR_DEPARTMENT_OTHER): Payer: Medicare Other | Admitting: Oncology

## 2010-11-04 ENCOUNTER — Ambulatory Visit: Payer: Medicare Other | Admitting: Occupational Therapy

## 2010-11-04 ENCOUNTER — Ambulatory Visit: Payer: Medicare Other | Admitting: *Deleted

## 2010-11-04 DIAGNOSIS — C189 Malignant neoplasm of colon, unspecified: Secondary | ICD-10-CM

## 2010-11-17 ENCOUNTER — Encounter: Payer: Medicare Other | Attending: Physical Medicine & Rehabilitation

## 2010-11-17 ENCOUNTER — Ambulatory Visit: Payer: Medicare Other | Admitting: Physical Medicine & Rehabilitation

## 2010-11-17 ENCOUNTER — Other Ambulatory Visit: Payer: Self-pay | Admitting: Neurology

## 2010-11-17 DIAGNOSIS — G8110 Spastic hemiplegia affecting unspecified side: Secondary | ICD-10-CM

## 2010-11-17 DIAGNOSIS — M069 Rheumatoid arthritis, unspecified: Secondary | ICD-10-CM | POA: Insufficient documentation

## 2010-11-17 DIAGNOSIS — G252 Other specified forms of tremor: Secondary | ICD-10-CM

## 2010-11-17 DIAGNOSIS — I619 Nontraumatic intracerebral hemorrhage, unspecified: Secondary | ICD-10-CM

## 2010-11-17 DIAGNOSIS — I609 Nontraumatic subarachnoid hemorrhage, unspecified: Secondary | ICD-10-CM

## 2010-11-17 DIAGNOSIS — Z96649 Presence of unspecified artificial hip joint: Secondary | ICD-10-CM | POA: Insufficient documentation

## 2010-11-17 DIAGNOSIS — I69959 Hemiplegia and hemiparesis following unspecified cerebrovascular disease affecting unspecified side: Secondary | ICD-10-CM | POA: Insufficient documentation

## 2010-11-17 DIAGNOSIS — R51 Headache: Secondary | ICD-10-CM | POA: Insufficient documentation

## 2010-11-17 DIAGNOSIS — G8929 Other chronic pain: Secondary | ICD-10-CM | POA: Insufficient documentation

## 2010-11-17 DIAGNOSIS — R41840 Attention and concentration deficit: Secondary | ICD-10-CM | POA: Insufficient documentation

## 2010-11-17 NOTE — Assessment & Plan Note (Signed)
REASON FOR VISIT:  Left-sided weakness due to right intracranial hemorrhage.  A 77 year old female with complex past medical history, right frontal lobe hemorrhage with left hemiparesis, left neglect which has subsequently improved, has finished up with physical therapy and occupational therapy, has not kept up with all her home exercise program.  She has been placed on Neurontin by Orthopedics for toe pain which was thought to be due to a "pinched nerve."  She has had no other medical problems.  No falls.  She uses a cane to ambulate.  REVIEW OF SYSTEMS:  Confusion, depression, and anxiety.  Review of therapy notes has some problems with attention, concentration, but no more somnolence.  Her blood pressure 119/47, pulse 63, respirations 15, and O2 saturation 98% on room air.  She has full strength in bilateral upper and lower extremity.  Range of motion is full in bilateral upper and lower extremity.  IMPRESSION: 1. Right intracranial hemorrhage, left hemiparesis. 2. Chronic pain.  History of rheumatoid arthritis, multiple joint     replacements, hip replacement on the left in 2000 and on the right     in 1984, knee replacement on the left in 1991.  Laminectomy and     fusion in 1989.  Cervical fusion in 2000.  We will keep her on her tramadol.  She does fine with this, does not need to go back on the Dilaudid that [facility] had her on.     Erick Colace, M.D. Electronically Signed    AEK/MedQ D:  11/17/2010 11:39:13  T:  11/17/2010 12:27:46  Job #:  [id number]  cc:   Marlan Palau, M.D. Fax: [phone]

## 2010-11-23 ENCOUNTER — Ambulatory Visit
Admission: RE | Admit: 2010-11-23 | Discharge: 2010-11-23 | Disposition: A | Discharge status: Home / Self Care | Payer: Medicare Other | Source: Ambulatory Visit | Attending: Neurology | Admitting: Neurology

## 2010-11-23 DIAGNOSIS — I619 Nontraumatic intracerebral hemorrhage, unspecified: Secondary | ICD-10-CM

## 2010-11-23 DIAGNOSIS — G250 Essential tremor: Secondary | ICD-10-CM

## 2010-11-23 DIAGNOSIS — G252 Other specified forms of tremor: Secondary | ICD-10-CM

## 2010-11-23 MED ORDER — GADOBENATE DIMEGLUMINE 529 MG/ML IV SOLN
14.0000 mL | Freq: Once | INTRAVENOUS | Status: AC | PRN
  Administered 2010-11-23: 14 mL via INTRAVENOUS

## 2010-12-01 ENCOUNTER — Other Ambulatory Visit (INDEPENDENT_AMBULATORY_CARE_PROVIDER_SITE_OTHER): Payer: Self-pay

## 2010-12-01 DIAGNOSIS — Z853 Personal history of malignant neoplasm of breast: Secondary | ICD-10-CM

## 2011-01-12 ENCOUNTER — Encounter: Payer: Medicare Other | Attending: Physical Medicine & Rehabilitation

## 2011-01-12 ENCOUNTER — Ambulatory Visit: Payer: Medicare Other | Admitting: Physical Medicine & Rehabilitation

## 2011-01-12 DIAGNOSIS — R41840 Attention and concentration deficit: Secondary | ICD-10-CM | POA: Insufficient documentation

## 2011-01-12 DIAGNOSIS — M069 Rheumatoid arthritis, unspecified: Secondary | ICD-10-CM | POA: Insufficient documentation

## 2011-01-12 DIAGNOSIS — G8929 Other chronic pain: Secondary | ICD-10-CM | POA: Insufficient documentation

## 2011-01-12 DIAGNOSIS — I69959 Hemiplegia and hemiparesis following unspecified cerebrovascular disease affecting unspecified side: Secondary | ICD-10-CM | POA: Insufficient documentation

## 2011-01-12 DIAGNOSIS — R51 Headache: Secondary | ICD-10-CM | POA: Insufficient documentation

## 2011-01-12 DIAGNOSIS — Z96649 Presence of unspecified artificial hip joint: Secondary | ICD-10-CM | POA: Insufficient documentation

## 2011-01-20 ENCOUNTER — Ambulatory Visit: Payer: Medicare Other | Admitting: Physical Medicine & Rehabilitation

## 2011-01-20 DIAGNOSIS — I609 Nontraumatic subarachnoid hemorrhage, unspecified: Secondary | ICD-10-CM

## 2011-01-20 DIAGNOSIS — M1710 Unilateral primary osteoarthritis, unspecified knee: Secondary | ICD-10-CM

## 2011-01-20 NOTE — Assessment & Plan Note (Signed)
[name] returns today.  She has history of subarachnoid hemorrhage.  She has had good recovery, went through inpatient as well as outpatient rehab.  She is coming off her Ritalin given that she had no more problems with attention, concentration, or sedation.  Her left hemiparesis has more or less resolved.  She is followed up with Neurology, Dr. Anne Hahn.  She has had some right knee pain.  She has had previous TKR on that side.  She has a past history of rheumatoid arthritis, multiple joint replacements.  Pain score is 9/10 on average.  She can walk 30 minutes at a time.  She can climb steps.  REVIEW OF SYSTEMS:  Please refer to health and history form.  Review of systems otherwise negative.  SOCIAL HISTORY:  Married, lives with her husband.  PHYSICAL EXAMINATION:  VITAL SIGNS:  Blood pressure 124/71, pulse is 59, respirations 16, and O2 sat 98% on room air. MUSCULOSKELETAL:  5/5 strength bilateral upper and lower extremities. Right knee, minimal effusion, multiple scars from previous knee replacements.  No tenderness to palpation or erythema.  No swelling in the lower extremities.  IMPRESSION: 1. Subarachnoid hemorrhage.  She has really had a good recovery.  She     is back to her baseline more or less her husband states that     mentally she really has not shown any long-term sequelae.  I think,     we can release her back to driving under graduated fashion starting     out with an empty parking lot with her husband sitting beside her     and then going to a less congested street, then a more congested     street, I would not recommend any interstate driving or any     nighttime driving.  They state they will abide by these     restrictions. 2. Chronic pain syndrome related to rheumatoid arthritis, multiple     joint complaints.  We will trial her on Pennsaid on the right knee     q.i.d.  I will see her back in about 3 months.     Erick Colace, M.D. Electronically  Signed    AEK/MedQ D:  01/20/2011 15:04:03  T:  01/20/2011 21:33:37  Job #:  [id number]

## 2011-02-06 ENCOUNTER — Ambulatory Visit (HOSPITAL_COMMUNITY)
Admission: RE | Admit: 2011-02-06 | Discharge: 2011-02-06 | Disposition: A | Discharge status: Home / Self Care | Payer: Medicare Other | Source: Ambulatory Visit | Attending: Internal Medicine | Admitting: Internal Medicine

## 2011-02-06 DIAGNOSIS — R0602 Shortness of breath: Secondary | ICD-10-CM | POA: Insufficient documentation

## 2011-03-22 ENCOUNTER — Other Ambulatory Visit: Payer: Self-pay | Admitting: Internal Medicine

## 2011-03-22 ENCOUNTER — Ambulatory Visit
Admission: RE | Admit: 2011-03-22 | Discharge: 2011-03-22 | Disposition: A | Discharge status: Home / Self Care | Payer: Medicare Other | Source: Ambulatory Visit | Attending: Internal Medicine | Admitting: Internal Medicine

## 2011-03-22 DIAGNOSIS — R52 Pain, unspecified: Secondary | ICD-10-CM

## 2011-03-22 DIAGNOSIS — R609 Edema, unspecified: Secondary | ICD-10-CM

## 2011-04-05 ENCOUNTER — Other Ambulatory Visit: Payer: Self-pay | Admitting: *Deleted

## 2011-04-05 ENCOUNTER — Encounter: Payer: Self-pay | Admitting: *Deleted

## 2011-04-05 DIAGNOSIS — C180 Malignant neoplasm of cecum: Secondary | ICD-10-CM

## 2011-04-05 MED ORDER — COLESTIPOL HCL 1 G PO TABS: 1.0000 g | ORAL_TABLET | ORAL | Status: DC

## 2011-04-18 ENCOUNTER — Ambulatory Visit: Payer: Medicare Other | Admitting: Physical Medicine & Rehabilitation

## 2011-04-18 ENCOUNTER — Encounter: Payer: Medicare Other | Attending: Physical Medicine & Rehabilitation

## 2011-04-18 DIAGNOSIS — Z96649 Presence of unspecified artificial hip joint: Secondary | ICD-10-CM | POA: Insufficient documentation

## 2011-04-18 DIAGNOSIS — M069 Rheumatoid arthritis, unspecified: Secondary | ICD-10-CM | POA: Insufficient documentation

## 2011-04-18 DIAGNOSIS — I69959 Hemiplegia and hemiparesis following unspecified cerebrovascular disease affecting unspecified side: Secondary | ICD-10-CM | POA: Insufficient documentation

## 2011-04-18 DIAGNOSIS — R51 Headache: Secondary | ICD-10-CM | POA: Insufficient documentation

## 2011-04-18 DIAGNOSIS — R41840 Attention and concentration deficit: Secondary | ICD-10-CM | POA: Insufficient documentation

## 2011-04-18 DIAGNOSIS — G8929 Other chronic pain: Secondary | ICD-10-CM | POA: Insufficient documentation

## 2011-04-28 ENCOUNTER — Ambulatory Visit: Payer: Medicare Other | Admitting: Physical Medicine & Rehabilitation

## 2011-04-28 DIAGNOSIS — M069 Rheumatoid arthritis, unspecified: Secondary | ICD-10-CM

## 2011-04-28 DIAGNOSIS — I609 Nontraumatic subarachnoid hemorrhage, unspecified: Secondary | ICD-10-CM

## 2011-04-28 NOTE — Assessment & Plan Note (Signed)
REASON FOR VISIT:  Back pain, leg pain, as well as decreased balance.  A 77 year old female with history of rheumatoid arthritis.  She has had multiple joint replacements including lumbar spinal fusion.  She was initially seen by me in [facility] for subarachnoid hemorrhage and rehab following that.  She initially had some left hemiparesis, but this has cleared up.  She has had no falls.  She has her usual chronic pain symptoms in her back, neck, and feet.  She has seen Dr. Luiz Blare from Orthopedics.  It sounds like she may have some degeneration above the level of her lumbar fusion.  Her functional status is independent with basic ADLs.  She needs some help from her husband for meal prep and household duties.  REVIEW OF SYSTEMS:  Positive for tremor, dizziness, confusion, depression, and shortness of breath.  SOCIAL HISTORY:  Married, lives with her husband.  PHYSICAL EXAMINATION:  VITAL SIGNS:  Blood pressure 130/72, pulse 88, respirations 16, O2 sat 97% on room air, respiratory rate is 16. GENERAL:  A well-developed well-nourished female in no acute distress. Orientation x3.  Affect alert.  Gait is with a cane, widened-base support, small step length.  No evidence of toe drag or knee instability. MUSCULOSKELETAL:  Her upper extremity range of motion normal, lower extremity range of motion is good at the knees and ankles.  She has no tenderness to palpation in lumbar spine.  She does have a healed surgical incision.  She has limited range of motion in lumbar spine about 50% forward flexion and essentially no extension.  IMPRESSION: 1. Lumbar post laminectomy syndrome, chronic low back pain. 2. History of rheumatoid arthritis with multiple joint involvement,     multiple joint replacement. 3. History of subarachnoid hemorrhage, her main residual is decreased     balance and perhaps some cognitive slowing, although her husband     thinks she is back at her  baseline. 4. Discussed current medications with patient.  We will continue her     Ultram 50 mg q.i.d.  She also uses Pennsaid to her joints, but not     sure whether it helps.  She liked actually just try some Biofreeze.     We got her up 3 ounce bottle Biofreeze today.  Discussed with patient, agrees with plan.     Erick Colace, M.D. Electronically Signed    AEK/MedQ D:  04/28/2011 11:44:38  T:  04/28/2011 40:98:11  Job #:  [id number]

## 2011-10-03 ENCOUNTER — Other Ambulatory Visit: Payer: Self-pay | Admitting: *Deleted

## 2011-10-03 DIAGNOSIS — C180 Malignant neoplasm of cecum: Secondary | ICD-10-CM

## 2011-10-03 MED ORDER — COLESTIPOL HCL 1 G PO TABS: 1.0000 g | ORAL_TABLET | ORAL | Status: DC

## 2011-10-24 ENCOUNTER — Ambulatory Visit: Payer: Medicare Other | Admitting: Physical Medicine & Rehabilitation

## 2011-10-25 ENCOUNTER — Other Ambulatory Visit (HOSPITAL_BASED_OUTPATIENT_CLINIC_OR_DEPARTMENT_OTHER): Payer: Medicare Other | Admitting: Lab

## 2011-10-25 DIAGNOSIS — C50419 Malignant neoplasm of upper-outer quadrant of unspecified female breast: Secondary | ICD-10-CM

## 2011-10-25 DIAGNOSIS — C189 Malignant neoplasm of colon, unspecified: Secondary | ICD-10-CM

## 2011-10-25 LAB — CBC WITH DIFFERENTIAL/PLATELET
Basophils Absolute: 0 10*3/uL (ref 0.0–0.1)
EOS%: 2.3 % (ref 0.0–7.0)
HGB: 12 g/dL (ref 11.6–15.9)
LYMPH%: 19.2 % (ref 14.0–49.7)
MCH: 33.2 pg (ref 25.1–34.0)
MCV: 100.9 fL (ref 79.5–101.0)
MONO%: 8.3 % (ref 0.0–14.0)
RDW: 12.9 % (ref 11.2–14.5)

## 2011-10-26 LAB — COMPREHENSIVE METABOLIC PANEL
AST: 20 U/L (ref 0–37)
Albumin: 3.7 g/dL (ref 3.5–5.2)
BUN: 13 mg/dL (ref 6–23)
Calcium: 8.6 mg/dL (ref 8.4–10.5)
Chloride: 104 mEq/L (ref 96–112)
Glucose, Bld: 118 mg/dL — ABNORMAL HIGH (ref 70–99)
Potassium: 4.2 mEq/L (ref 3.5–5.3)
Sodium: 144 mEq/L (ref 135–145)
Total Protein: 5.5 g/dL — ABNORMAL LOW (ref 6.0–8.3)

## 2011-11-03 ENCOUNTER — Ambulatory Visit (HOSPITAL_BASED_OUTPATIENT_CLINIC_OR_DEPARTMENT_OTHER): Payer: Medicare Other | Admitting: Oncology

## 2011-11-03 ENCOUNTER — Telehealth: Payer: Self-pay | Admitting: *Deleted

## 2011-11-03 ENCOUNTER — Telehealth: Payer: Self-pay | Admitting: Oncology

## 2011-11-03 VITALS — BP 105/61 | HR 67 | Temp 98.2°F | Ht 64.0 in | Wt 143.0 lb

## 2011-11-03 DIAGNOSIS — C189 Malignant neoplasm of colon, unspecified: Secondary | ICD-10-CM

## 2011-11-03 DIAGNOSIS — F329 Major depressive disorder, single episode, unspecified: Secondary | ICD-10-CM

## 2011-11-03 DIAGNOSIS — E559 Vitamin D deficiency, unspecified: Secondary | ICD-10-CM

## 2011-11-03 DIAGNOSIS — F600 Paranoid personality disorder: Secondary | ICD-10-CM

## 2011-11-03 DIAGNOSIS — C50919 Malignant neoplasm of unspecified site of unspecified female breast: Secondary | ICD-10-CM

## 2011-11-03 NOTE — Telephone Encounter (Signed)
Pt is aware that she will be contacted by dr Enzo Bi

## 2011-11-03 NOTE — Telephone Encounter (Signed)
per order from 11-03-2011 called dr.peters left voice message to inform md of the referral

## 2011-11-03 NOTE — Telephone Encounter (Signed)
gve the pt his June,july 2014 appt calendar

## 2011-11-05 NOTE — Progress Notes (Signed)
Hematology and Oncology Follow Up Visit  [name] [id number] 12/27/1933 78 y.o. 11/05/2011 11:11 AM   DIAGNOSIS:   Encounter Diagnoses  Name Primary?  . Malignant neoplasm of breast (female), unspecified site 1985, 2005 on femara  2.5 mg/day  . Colon cancer T3N0. S/p 3 cycles of chemo; completed 5/07; d/c ed due to poor tolerance  . Depression   . Unspecified vitamin D deficiency   Hemorrhagic strroke 3/12, followed by rehab   PAST THERAPY: as above   Interim History:  Since being seen last patient has had ongoing issues with depression and difficulty coping. She's had a fairly extensive rehabilitation process. This is helped her mobility. She is not able to drive as of yet this is frustrating her. She is been on antidepressants but is clearly quite depressed and is quite teary during our visit. She hasn't had formal psychiatric of followup. She has a fairly lengthy medicine last. She is on [place] which is helping her to some degree . she recently has been to her dermatologist who removed some skin lesions. She does have this erythema which is been her chest wall related to her breast cancer but certainly seems to be progressing down to her upper abdomen. She has had multiple antibiotic to prescriptions for recurrent cellulitis.  Medications: I have reviewed the patient's current medications.  Allergies:  Allergies  Allergen Reactions  . Demerol   . Fentanyl   . Metoclopramide Hcl   . Morphine And Related   . Nsaids   . Pentazocine   . Pentazocine Lactate     Past Medical History, Surgical history, Social history, and Family History were reviewed and updated.  Review of Systems: Constitutional:  Negative for fever, chills, night sweats, anorexia, weight loss, pain. Cardiovascular: negative Respiratory: negative Neurological: negative Dermatological: negative ENT: negative Skin Gastrointestinal: negative Genito-Urinary: negative Hematological and Lymphatic:  negative Breast: negative Musculoskeletal: negative Remaining ROS negative.  Physical Exam:  Blood pressure 105/61, pulse 67, temperature 98.2 F (36.8 C), temperature source Oral, height 5\' 4"  (1.626 m), weight 143 lb (64.864 kg).  ECOG: 1  HEENT:  Sclerae anicteric, conjunctivae pink.  Oropharynx clear.  No mucositis or candidiasis.  Nodes:  No cervical, supraclavicular, or axillary lymphadenopathy palpated.  Breast Exam: She status post bilateral mastectomies. His obvious evidence of local recurrence. There is even erythema which is nonpalpable extending down her right side of her chest or upper abdomen. This does not blanch.  Lungs:  Clear to auscultation bilaterally.  No crackles, rhonchi, or wheezes.  Heart:  Regular rate and rhythm.  Abdomen:  Soft, nontender.  Positive bowel sounds.  No organomegaly or masses palpated.  Musculoskeletal:  No focal spinal tenderness to palpation.  Extremities:  Benign.  No peripheral edema or cyanosis.  Skin:  Benign.  Neuro:  Nonfocal.     Lab Results: Lab Results  Component Value Date   WBC 5.9 10/25/2011   HGB 12.0 10/25/2011   HCT 36.6 10/25/2011   MCV 100.9 10/25/2011   PLT 99* 10/25/2011     Chemistry      Component Value Date/Time   NA 144 10/25/2011 1401   K 4.2 10/25/2011 1401   CL 104 10/25/2011 1401   CO2 30 10/25/2011 1401   BUN 13 10/25/2011 1401   CREATININE 0.77 10/25/2011 1401      Component Value Date/Time   CALCIUM 8.6 10/25/2011 1401   ALKPHOS 55 10/25/2011 1401   AST 20 10/25/2011 1401   ALT 12 10/25/2011 1401  BILITOT 0.3 10/25/2011 1401       Radiological Studies:  No results found.   IMPRESSIONS AND PLAN: A 78 y.o. female with   History of blood but bilateral breast cancer as well as colon cancer. She has had a recent hemorrhagic stroke which she is recovered from unfortunately she has developed depression around this issue. She also has some paranoid ideation. She is on [place] conjunction with more formal  intervention. I recommended a psychiatric referral and I have made today. I also suggested she see our nurse clinical psychologist. As far as her edema is concerned chest I am little concerned that this is been a long-standing issue. Has not resolved if anything may be getting worse. I've referred her back to her dermatologist to get this other area biopsied as I wonder if she does have underlying cutaneous lymphoma which could certainly represent with diffuse erythema. I will plan to see her in a years time.  Spent more than half the time coordinating care, as well as discussion of BMI and its implications.      Fiorella Hanahan 6/30/201311:11 AM Cell [phone]

## 2011-11-06 ENCOUNTER — Encounter: Payer: Self-pay | Admitting: Psychiatry

## 2011-11-15 ENCOUNTER — Ambulatory Visit (INDEPENDENT_AMBULATORY_CARE_PROVIDER_SITE_OTHER): Payer: Medicare Other | Admitting: Psychiatry

## 2011-11-15 DIAGNOSIS — F0630 Mood disorder due to known physiological condition, unspecified: Secondary | ICD-10-CM

## 2011-11-23 ENCOUNTER — Ambulatory Visit (INDEPENDENT_AMBULATORY_CARE_PROVIDER_SITE_OTHER): Payer: Medicare Other | Admitting: Psychiatry

## 2011-11-23 DIAGNOSIS — F0630 Mood disorder due to known physiological condition, unspecified: Secondary | ICD-10-CM

## 2011-11-23 NOTE — Progress Notes (Signed)
11-23-2011  Patient and husband in conjoint psychotherapy.  Both report great improvement in their marital happiness as they resume daily mutual prayer time.  Recommended they get the lumbar spine adjustments repaired in their car, resume exercise at the y and plan a trip in the near future. They will call for additional appointment PRN.

## 2011-12-26 ENCOUNTER — Ambulatory Visit (INDEPENDENT_AMBULATORY_CARE_PROVIDER_SITE_OTHER): Payer: Medicare Other | Admitting: Psychiatry

## 2011-12-26 DIAGNOSIS — F0630 Mood disorder due to known physiological condition, unspecified: Secondary | ICD-10-CM

## 2011-12-26 DIAGNOSIS — Z7189 Other specified counseling: Secondary | ICD-10-CM

## 2011-12-26 NOTE — Progress Notes (Signed)
12-26-2011  Patient seen for individual psychotherapy.  Husband is scheduled for appointment on 01-03-2012.

## 2011-12-29 ENCOUNTER — Other Ambulatory Visit: Payer: Self-pay | Admitting: Physical Medicine & Rehabilitation

## 2012-01-03 ENCOUNTER — Ambulatory Visit (INDEPENDENT_AMBULATORY_CARE_PROVIDER_SITE_OTHER): Payer: Medicare Other | Admitting: Psychiatry

## 2012-01-03 DIAGNOSIS — F0630 Mood disorder due to known physiological condition, unspecified: Secondary | ICD-10-CM

## 2012-01-03 NOTE — Progress Notes (Signed)
01-03-2012  Patient's husband seen for individual psychotherapy to address care-giver issues.  Next appointment 01-22-2012.

## 2012-01-22 ENCOUNTER — Ambulatory Visit (INDEPENDENT_AMBULATORY_CARE_PROVIDER_SITE_OTHER): Payer: Medicare Other | Admitting: Psychiatry

## 2012-01-22 DIAGNOSIS — F0630 Mood disorder due to known physiological condition, unspecified: Secondary | ICD-10-CM

## 2012-01-23 NOTE — Progress Notes (Signed)
01-22-2012  Patient seen for conjoint psychotherapy with husband.  Next appointment 02-01-2012.

## 2012-02-01 ENCOUNTER — Ambulatory Visit (INDEPENDENT_AMBULATORY_CARE_PROVIDER_SITE_OTHER): Payer: Medicare Other | Admitting: Psychiatry

## 2012-02-01 DIAGNOSIS — F0630 Mood disorder due to known physiological condition, unspecified: Secondary | ICD-10-CM

## 2012-02-01 NOTE — Progress Notes (Signed)
02-01-2012  Patient and husband seen for conjoint psychotherapy.  Next appointment 02-07-2012.

## 2012-02-07 ENCOUNTER — Ambulatory Visit (INDEPENDENT_AMBULATORY_CARE_PROVIDER_SITE_OTHER): Payer: Medicare Other | Admitting: Psychiatry

## 2012-02-07 DIAGNOSIS — F0630 Mood disorder due to known physiological condition, unspecified: Secondary | ICD-10-CM

## 2012-02-21 ENCOUNTER — Ambulatory Visit (INDEPENDENT_AMBULATORY_CARE_PROVIDER_SITE_OTHER): Payer: Medicare Other | Admitting: Psychiatry

## 2012-02-21 DIAGNOSIS — F0630 Mood disorder due to known physiological condition, unspecified: Secondary | ICD-10-CM

## 2012-02-22 ENCOUNTER — Other Ambulatory Visit: Payer: Self-pay | Admitting: Orthopedic Surgery

## 2012-02-26 ENCOUNTER — Encounter (HOSPITAL_BASED_OUTPATIENT_CLINIC_OR_DEPARTMENT_OTHER): Payer: Self-pay | Admitting: *Deleted

## 2012-02-26 NOTE — Progress Notes (Signed)
To come in for bmet=-ekg Called for notes dr Particia Lather-

## 2012-02-27 ENCOUNTER — Other Ambulatory Visit: Payer: Self-pay

## 2012-02-27 ENCOUNTER — Encounter (HOSPITAL_BASED_OUTPATIENT_CLINIC_OR_DEPARTMENT_OTHER)
Admission: RE | Admit: 2012-02-27 | Discharge: 2012-02-27 | Disposition: A | Discharge status: Home / Self Care | Payer: Medicare Other | Source: Ambulatory Visit | Attending: Orthopedic Surgery | Admitting: Orthopedic Surgery

## 2012-02-27 LAB — BASIC METABOLIC PANEL
Calcium: 8.9 mg/dL (ref 8.4–10.5)
GFR calc Af Amer: >90 mL/min (ref >90)
GFR calc non Af Amer: 87 mL/min — ABNORMAL LOW (ref >90)
Sodium: 137 mEq/L (ref 135–145)

## 2012-03-01 ENCOUNTER — Encounter (HOSPITAL_BASED_OUTPATIENT_CLINIC_OR_DEPARTMENT_OTHER)
Admission: RE | Disposition: A | Discharge status: Home / Self Care | Payer: Self-pay | Source: Ambulatory Visit | Attending: Orthopedic Surgery

## 2012-03-01 ENCOUNTER — Ambulatory Visit (HOSPITAL_BASED_OUTPATIENT_CLINIC_OR_DEPARTMENT_OTHER)
Admission: RE | Admit: 2012-03-01 | Discharge: 2012-03-01 | Disposition: A | Discharge status: Home / Self Care | Payer: Medicare Other | Source: Ambulatory Visit | Attending: Orthopedic Surgery | Admitting: Orthopedic Surgery

## 2012-03-01 DIAGNOSIS — T8489XA Other specified complication of internal orthopedic prosthetic devices, implants and grafts, initial encounter: Secondary | ICD-10-CM | POA: Insufficient documentation

## 2012-03-01 DIAGNOSIS — Z01810 Encounter for preprocedural cardiovascular examination: Secondary | ICD-10-CM | POA: Insufficient documentation

## 2012-03-01 DIAGNOSIS — Z538 Procedure and treatment not carried out for other reasons: Secondary | ICD-10-CM | POA: Insufficient documentation

## 2012-03-01 DIAGNOSIS — Z01812 Encounter for preprocedural laboratory examination: Secondary | ICD-10-CM | POA: Insufficient documentation

## 2012-03-01 DIAGNOSIS — Y831 Surgical operation with implant of artificial internal device as the cause of abnormal reaction of the patient, or of later complication, without mention of misadventure at the time of the procedure: Secondary | ICD-10-CM | POA: Insufficient documentation

## 2012-03-01 HISTORY — DX: Hypothyroidism, unspecified: E03.9

## 2012-03-01 HISTORY — DX: Other specified symptoms and signs involving the digestive system and abdomen: R19.8

## 2012-03-01 HISTORY — DX: Tremor, unspecified: R25.1

## 2012-03-01 HISTORY — DX: Unspecified osteoarthritis, unspecified site: M19.90

## 2012-03-01 HISTORY — DX: Depression, unspecified: F32.A

## 2012-03-01 HISTORY — DX: Major depressive disorder, single episode, unspecified: F32.9

## 2012-03-01 HISTORY — DX: Rheumatoid arthritis, unspecified: M06.9

## 2012-03-01 HISTORY — DX: Cerebral infarction, unspecified: I63.9

## 2012-03-01 HISTORY — DX: Essential (primary) hypertension: I10

## 2012-03-01 HISTORY — DX: Cardiac arrhythmia, unspecified: I49.9

## 2012-03-01 HISTORY — DX: Malignant neoplasm of colon, unspecified: C18.9

## 2012-03-01 SURGERY — REMOVAL, HARDWARE
Anesthesia: Choice | Site: Foot | Laterality: Left

## 2012-03-06 ENCOUNTER — Ambulatory Visit (INDEPENDENT_AMBULATORY_CARE_PROVIDER_SITE_OTHER): Payer: Medicare Other | Admitting: Psychiatry

## 2012-03-06 DIAGNOSIS — F0630 Mood disorder due to known physiological condition, unspecified: Secondary | ICD-10-CM

## 2012-03-08 ENCOUNTER — Other Ambulatory Visit: Payer: Self-pay | Admitting: *Deleted

## 2012-03-08 DIAGNOSIS — C180 Malignant neoplasm of cecum: Secondary | ICD-10-CM

## 2012-03-08 MED ORDER — COLESTIPOL HCL 1 G PO TABS: 1.0000 g | ORAL_TABLET | ORAL | Status: DC

## 2012-03-26 ENCOUNTER — Other Ambulatory Visit: Payer: Self-pay | Admitting: Orthopedic Surgery

## 2012-03-28 NOTE — Progress Notes (Signed)
Cleared for surgery by dr crews

## 2012-03-28 NOTE — Progress Notes (Signed)
Surgery was r/s from 02/27/12-labs done then

## 2012-03-29 ENCOUNTER — Encounter (HOSPITAL_BASED_OUTPATIENT_CLINIC_OR_DEPARTMENT_OTHER): Payer: Self-pay | Admitting: Certified Registered"

## 2012-03-29 ENCOUNTER — Encounter (HOSPITAL_BASED_OUTPATIENT_CLINIC_OR_DEPARTMENT_OTHER)
Admission: RE | Disposition: A | Discharge status: Home / Self Care | Payer: Self-pay | Source: Ambulatory Visit | Attending: Orthopedic Surgery

## 2012-03-29 ENCOUNTER — Encounter (HOSPITAL_BASED_OUTPATIENT_CLINIC_OR_DEPARTMENT_OTHER): Payer: Self-pay | Admitting: *Deleted

## 2012-03-29 ENCOUNTER — Ambulatory Visit (HOSPITAL_BASED_OUTPATIENT_CLINIC_OR_DEPARTMENT_OTHER)
Admission: RE | Admit: 2012-03-29 | Discharge: 2012-03-29 | Disposition: A | Discharge status: Home / Self Care | Payer: Medicare Other | Source: Ambulatory Visit | Attending: Orthopedic Surgery | Admitting: Orthopedic Surgery

## 2012-03-29 ENCOUNTER — Ambulatory Visit (HOSPITAL_BASED_OUTPATIENT_CLINIC_OR_DEPARTMENT_OTHER): Payer: Medicare Other | Admitting: Certified Registered"

## 2012-03-29 DIAGNOSIS — T8489XA Other specified complication of internal orthopedic prosthetic devices, implants and grafts, initial encounter: Secondary | ICD-10-CM | POA: Insufficient documentation

## 2012-03-29 DIAGNOSIS — I69959 Hemiplegia and hemiparesis following unspecified cerebrovascular disease affecting unspecified side: Secondary | ICD-10-CM | POA: Insufficient documentation

## 2012-03-29 DIAGNOSIS — M79673 Pain in unspecified foot: Secondary | ICD-10-CM

## 2012-03-29 DIAGNOSIS — M069 Rheumatoid arthritis, unspecified: Secondary | ICD-10-CM | POA: Insufficient documentation

## 2012-03-29 DIAGNOSIS — M79609 Pain in unspecified limb: Secondary | ICD-10-CM | POA: Insufficient documentation

## 2012-03-29 DIAGNOSIS — Y831 Surgical operation with implant of artificial internal device as the cause of abnormal reaction of the patient, or of later complication, without mention of misadventure at the time of the procedure: Secondary | ICD-10-CM | POA: Insufficient documentation

## 2012-03-29 DIAGNOSIS — E039 Hypothyroidism, unspecified: Secondary | ICD-10-CM | POA: Insufficient documentation

## 2012-03-29 DIAGNOSIS — Z79899 Other long term (current) drug therapy: Secondary | ICD-10-CM | POA: Insufficient documentation

## 2012-03-29 DIAGNOSIS — Z85038 Personal history of other malignant neoplasm of large intestine: Secondary | ICD-10-CM | POA: Insufficient documentation

## 2012-03-29 DIAGNOSIS — Z853 Personal history of malignant neoplasm of breast: Secondary | ICD-10-CM | POA: Insufficient documentation

## 2012-03-29 DIAGNOSIS — I10 Essential (primary) hypertension: Secondary | ICD-10-CM | POA: Insufficient documentation

## 2012-03-29 HISTORY — PX: HARDWARE REMOVAL: SHX979

## 2012-03-29 HISTORY — DX: Lymphedema, not elsewhere classified: I89.0

## 2012-03-29 LAB — POCT HEMOGLOBIN-HEMACUE: Hemoglobin: 13 g/dL (ref 12.0–15.0)

## 2012-03-29 SURGERY — REMOVAL, HARDWARE
Anesthesia: General | Site: Foot | Laterality: Left | Wound class: Clean

## 2012-03-29 MED ORDER — ONDANSETRON HCL 4 MG/2ML IJ SOLN
INTRAMUSCULAR | Status: DC | PRN
  Administered 2012-03-29: 4 mg via INTRAVENOUS

## 2012-03-29 MED ORDER — LACTATED RINGERS IV SOLN
INTRAVENOUS | Status: DC
  Administered 2012-03-29: 10:00:00 via INTRAVENOUS

## 2012-03-29 MED ORDER — LIDOCAINE HCL (PF) 2 % IJ SOLN
INTRAMUSCULAR | Status: DC | PRN
  Administered 2012-03-29: 1 mL

## 2012-03-29 MED ORDER — OXYCODONE HCL 5 MG PO TABS: 5.0000 mg | ORAL_TABLET | Freq: Once | ORAL | Status: DC | PRN

## 2012-03-29 MED ORDER — PROPOFOL 10 MG/ML IV BOLUS
INTRAVENOUS | Status: DC | PRN
  Administered 2012-03-29: 20 mg via INTRAVENOUS
  Administered 2012-03-29: 100 mg via INTRAVENOUS

## 2012-03-29 MED ORDER — ONDANSETRON HCL 4 MG/2ML IJ SOLN: 4.0000 mg | Freq: Once | INTRAMUSCULAR | Status: DC | PRN

## 2012-03-29 MED ORDER — HYDROMORPHONE HCL PF 1 MG/ML IJ SOLN: 0.2500 mg | INTRAMUSCULAR | Status: DC | PRN

## 2012-03-29 MED ORDER — OXYCODONE HCL 5 MG/5ML PO SOLN: 5.0000 mg | Freq: Once | ORAL | Status: DC | PRN

## 2012-03-29 MED ORDER — CEFAZOLIN SODIUM-DEXTROSE 2-3 GM-% IV SOLR
2.0000 g | INTRAVENOUS | Status: AC
  Administered 2012-03-29: 2 g via INTRAVENOUS

## 2012-03-29 MED ORDER — LIDOCAINE HCL (CARDIAC) 20 MG/ML IV SOLN
INTRAVENOUS | Status: DC | PRN
  Administered 2012-03-29: 50 mg via INTRAVENOUS

## 2012-03-29 MED ORDER — OXYCODONE-ACETAMINOPHEN 5-325 MG PO TABS: 1.0000 | ORAL_TABLET | ORAL | Status: DC | PRN

## 2012-03-29 MED ORDER — POVIDONE-IODINE 7.5 % EX SOLN: Freq: Once | CUTANEOUS | Status: DC

## 2012-03-29 MED ORDER — CEFAZOLIN SODIUM-DEXTROSE 2-3 GM-% IV SOLR: 2.0000 g | INTRAVENOUS | Status: DC

## 2012-03-29 SURGICAL SUPPLY — 50 items
BANDAGE ELASTIC 4 VELCRO ST LF (GAUZE/BANDAGES/DRESSINGS) ×2 IMPLANT
BANDAGE ESMARK 6X9 LF (GAUZE/BANDAGES/DRESSINGS) IMPLANT
BLADE SURG 15 STRL LF DISP TIS (BLADE) ×1 IMPLANT
BLADE SURG 15 STRL SS (BLADE) ×1
BNDG COHESIVE 4X5 TAN STRL (GAUZE/BANDAGES/DRESSINGS) ×2 IMPLANT
BNDG ESMARK 4X9 LF (GAUZE/BANDAGES/DRESSINGS) ×2 IMPLANT
BNDG ESMARK 6X9 LF (GAUZE/BANDAGES/DRESSINGS)
CANISTER SUCTION 1200CC (MISCELLANEOUS) IMPLANT
CLOTH BEACON ORANGE TIMEOUT ST (SAFETY) ×2 IMPLANT
CUFF TOURNIQUET SINGLE 18IN (TOURNIQUET CUFF) ×2 IMPLANT
DRAPE EXTREMITY T 121X128X90 (DRAPE) ×2 IMPLANT
DRAPE INCISE IOBAN 66X45 STRL (DRAPES) IMPLANT
DRAPE OEC MINIVIEW 54X84 (DRAPES) IMPLANT
DRAPE U 20/CS (DRAPES) IMPLANT
DRAPE U-SHAPE 47X51 STRL (DRAPES) IMPLANT
DRAPE U-SHAPE 76X120 STRL (DRAPES) IMPLANT
DRSG EMULSION OIL 3X3 NADH (GAUZE/BANDAGES/DRESSINGS) ×2 IMPLANT
DURAPREP 26ML APPLICATOR (WOUND CARE) ×2 IMPLANT
ELECT REM PT RETURN 9FT ADLT (ELECTROSURGICAL)
ELECTRODE REM PT RTRN 9FT ADLT (ELECTROSURGICAL) IMPLANT
GLOVE BIOGEL PI IND STRL 8 (GLOVE) ×2 IMPLANT
GLOVE BIOGEL PI INDICATOR 8 (GLOVE) ×2
GLOVE ECLIPSE 6.5 STRL STRAW (GLOVE) ×2 IMPLANT
GLOVE ECLIPSE 7.5 STRL STRAW (GLOVE) ×4 IMPLANT
GOWN BRE IMP PREV XXLGXLNG (GOWN DISPOSABLE) ×2 IMPLANT
GOWN PREVENTION PLUS XLARGE (GOWN DISPOSABLE) ×2 IMPLANT
GOWN PREVENTION PLUS XXLARGE (GOWN DISPOSABLE) ×2 IMPLANT
NEEDLE HYPO 22GX1.5 SAFETY (NEEDLE) ×2 IMPLANT
NS IRRIG 1000ML POUR BTL (IV SOLUTION) ×2 IMPLANT
PACK ARTHROSCOPY DSU (CUSTOM PROCEDURE TRAY) ×2 IMPLANT
PACK BASIN DAY SURGERY FS (CUSTOM PROCEDURE TRAY) ×2 IMPLANT
PAD CAST 4YDX4 CTTN HI CHSV (CAST SUPPLIES) IMPLANT
PADDING CAST ABS 4INX4YD NS (CAST SUPPLIES) ×1
PADDING CAST ABS COTTON 4X4 ST (CAST SUPPLIES) ×1 IMPLANT
PADDING CAST COTTON 4X4 STRL (CAST SUPPLIES)
PENCIL BUTTON HOLSTER BLD 10FT (ELECTRODE) IMPLANT
SPONGE GAUZE 4X4 12PLY (GAUZE/BANDAGES/DRESSINGS) ×2 IMPLANT
SPONGE LAP 4X18 X RAY DECT (DISPOSABLE) IMPLANT
STOCKINETTE IMPERVIOUS LG (DRAPES) IMPLANT
STOCKINETTE TUBULAR 6 INCH (GAUZE/BANDAGES/DRESSINGS) ×2 IMPLANT
SUCTION FRAZIER TIP 10 FR DISP (SUCTIONS) IMPLANT
SUT ETHILON 3 0 PS 1 (SUTURE) IMPLANT
SUT ETHILON 4 0 PS 2 18 (SUTURE) ×2 IMPLANT
SUT VIC AB 2-0 SH 27 (SUTURE)
SUT VIC AB 2-0 SH 27XBRD (SUTURE) IMPLANT
SYR BULB 3OZ (MISCELLANEOUS) ×2 IMPLANT
SYR CONTROL 10ML LL (SYRINGE) ×2 IMPLANT
TOWEL OR NON WOVEN STRL DISP B (DISPOSABLE) ×2 IMPLANT
UNDERPAD 30X30 INCONTINENT (UNDERPADS AND DIAPERS) ×2 IMPLANT
WATER STERILE IRR 1000ML POUR (IV SOLUTION) ×2 IMPLANT

## 2012-03-29 NOTE — Transfer of Care (Signed)
Immediate Anesthesia Transfer of Care Note  Patient: [name]  Procedure(s) Performed: Procedure(s) (LRB) with comments: HARDWARE REMOVAL (Left) - Screw Removal Left Foot  Patient Location: PACU  Anesthesia Type:MAC  Level of Consciousness: awake, alert , oriented and patient cooperative  Airway & Oxygen Therapy: Patient Spontanous Breathing and Patient connected to face mask oxygen  Post-op Assessment: Report given to PACU RN and Post -op Vital signs reviewed and stable  Post vital signs: Reviewed and stable  Complications: No apparent anesthesia complications

## 2012-03-29 NOTE — Anesthesia Procedure Notes (Signed)
Procedure Name: MAC Date/Time: 03/29/2012 10:05 AM Performed by: Verlan Friends Pre-anesthesia Checklist: Patient identified, Timeout performed, Emergency Drugs available, Suction available and Patient being monitored Patient Re-evaluated:Patient Re-evaluated prior to inductionOxygen Delivery Method: Simple face mask Placement Confirmation: positive ETCO2 Dental Injury: Teeth and Oropharynx as per pre-operative assessment

## 2012-03-29 NOTE — Anesthesia Preprocedure Evaluation (Signed)
Anesthesia Evaluation  Patient identified by MRN, date of birth, ID band Patient awake    Reviewed: Allergy & Precautions, H&P , NPO status , Patient's Chart, lab work & pertinent test results  Airway Mallampati: I TM Distance: >3 FB Neck ROM: Full    Dental  (+) Teeth Intact and Dental Advisory Given   Pulmonary  breath sounds clear to auscultation        Cardiovascular hypertension, Rhythm:Regular Rate:Normal     Neuro/Psych    GI/Hepatic   Endo/Other    Renal/GU      Musculoskeletal   Abdominal   Peds  Hematology   Anesthesia Other Findings   Reproductive/Obstetrics                           Anesthesia Physical Anesthesia Plan  ASA: III  Anesthesia Plan: General   Post-op Pain Management:    Induction: Intravenous  Airway Management Planned: Oral ETT  Additional Equipment:   Intra-op Plan:   Post-operative Plan: Extubation in OR  Informed Consent: I have reviewed the patients History and Physical, chart, labs and discussed the procedure including the risks, benefits and alternatives for the proposed anesthesia with the patient or authorized representative who has indicated his/her understanding and acceptance.   Dental advisory given  Plan Discussed with: CRNA, Anesthesiologist and Surgeon  Anesthesia Plan Comments:         Anesthesia Quick Evaluation

## 2012-03-29 NOTE — Brief Op Note (Signed)
03/29/2012  12:08 PM  PATIENT:  [name]  78 y.o. female  PRE-OPERATIVE DIAGNOSIS:  Painful Screw Left Foot  POST-OPERATIVE DIAGNOSIS:  Painful Screw Left Foot  PROCEDURE:  Procedure(s) (LRB) with comments: HARDWARE REMOVAL (Left) - Screw Removal Left Foot  SURGEON:  Surgeon(s) and Role:    * Harvie Junior, MD - Primary  PHYSICIAN ASSISTANT:   ASSISTANTS: none   ANESTHESIA:   IV sedation  EBL:  Total I/O In: 1344 [P.O.:444; I.V.:900] Out: -   BLOOD ADMINISTERED:none  DRAINS: none   LOCAL MEDICATIONS USED:  LIDOCAINE   SPECIMEN:  No Specimen  DISPOSITION OF SPECIMEN:  N/A  COUNTS:  YES  TOURNIQUET:   Total Tourniquet Time Documented: Calf (Left) - 6 minutes  DICTATION: .Other Dictation: Dictation Number [id number]  PLAN OF CARE: Discharge to home after PACU  PATIENT DISPOSITION:  PACU - hemodynamically stable.   Delay start of Pharmacological VTE agent (>24hrs) due to surgical blood loss or risk of bleeding: no

## 2012-03-29 NOTE — H&P (Signed)
PREOPERATIVE H&P  Chief Complaint: l foot pain  HPI: [name] is a 78 y.o. female who presents for evaluation of l. Foot pain nwith retained hardware. It has been present for 6 mon and has been worsening. She has failed conservative measures. Pain is rated as moderate.  Past Medical History  Diagnosis Date  . PE (pulmonary embolism)   . Subarachnoid hemorrhage   . Spastic hemiplegia affecting nondominant side   . Intracerebral hemorrhage   . Hypertension   . Arthritis   . Stroke 3/12  . Gastrointestinal problem   . Depression   . Breast CA   . Colon cancer   . Tremor     benign essential  . Rheumatoid arthritis   . Hypothyroidism   . Dysrhythmia     palpitation   Past Surgical History  Procedure Date  . Fracture surgery 2010    rt wrist  . Mastectomy     bilat mastectomies   History   Social History  . Marital Status: Married    Spouse Name: N/A    Number of Children: N/A  . Years of Education: N/A   Social History Main Topics  . Smoking status: Former Smoker    Quit date: 02/25/1970  . Smokeless tobacco: None  . Alcohol Use: Yes     Comment: occ  . Drug Use:   . Sexually Active:    Other Topics Concern  . None   Social History Narrative  . None   History reviewed. No pertinent family history. Allergies  Allergen Reactions  . Demerol   . Fentanyl   . Metoclopramide Hcl   . Morphine And Related   . Nsaids   . Pentazocine   . Pentazocine Lactate    Prior to Admission medications   Medication Sig Start Date End Date Taking? Authorizing Provider  acetaminophen (TYLENOL) 500 MG tablet Take 500 mg by mouth 4 (four) times daily.   Yes Historical Provider, MD  Ascorbic Acid (VITAMIN C) 1000 MG tablet Take 1,000 mg by mouth daily.   Yes Historical Provider, MD  aspirin 81 MG tablet Take 81 mg by mouth daily.   Yes Historical Provider, MD  atenolol (TENORMIN) 25 MG tablet Take 25 mg by mouth daily.   Yes Historical Provider, MD  calcium  carbonate (TUMS EX) 750 MG chewable tablet Chew 4 tablets by mouth 2 (two) times daily.   Yes Historical Provider, MD  cholecalciferol (VITAMIN D) 1000 UNITS tablet Take 4,000 Units by mouth daily.   Yes Historical Provider, MD  colestipol (COLESTID) 1 G tablet Take 1 tablet (1 g total) by mouth See admin instructions. Take 1-2 tabs, two to three times per day  Brand name only 03/08/12  Yes Pierce Crane, MD  HYDROmorphone (DILAUDID) 2 MG tablet Take 2 mg by mouth every 4 (four) hours as needed.   Yes Historical Provider, MD  hydroxychloroquine (PLAQUENIL) 200 MG tablet Take 200 mg by mouth 2 (two) times daily.   Yes Historical Provider, MD  lactase-rennet 25-2 MG tablet Take 4 tablets by mouth 3 (three) times daily before meals.   Yes Historical Provider, MD  levothyroxine (SYNTHROID, LEVOTHROID) 200 MCG tablet Take 200 mcg by mouth daily.   Yes Historical Provider, MD  Multiple Vitamin (MULTIVITAMIN) tablet Take 1 tablet by mouth daily.   Yes Historical Provider, MD  primidone (MYSOLINE) 250 MG tablet Take 125 mg by mouth 3 (three) times daily.   Yes Historical Provider, MD  sertraline (ZOLOFT) 100  MG tablet Take 100 mg by mouth daily.   Yes Historical Provider, MD  vitamin E (VITAMIN E) 400 UNIT capsule Take 400 Units by mouth daily.   Yes Historical Provider, MD  ALPRAZolam (XANAX) 0.25 MG tablet Take 0.25 mg by mouth as needed.    Historical Provider, MD  diclofenac (FLECTOR) 1.3 % PTCH Place 1-2 patches onto the skin 2 (two) times daily.    Historical Provider, MD  gabapentin (NEURONTIN) 100 MG capsule Take 100 mg by mouth 3 (three) times daily.    Historical Provider, MD  lidocaine (LIDODERM) 5 % Place 1 patch onto the skin daily. Remove & Discard patch within 12 hours or as directed by MD    Historical Provider, MD  LORazepam (ATIVAN) 0.5 MG tablet Take 0.5 mg by mouth every morning.    Historical Provider, MD  predniSONE (DELTASONE) 5 MG tablet Take 5 mg by mouth daily.    Historical Provider,  MD  traMADol (ULTRAM) 50 MG tablet Take 50 mg by mouth 4 (four) times daily.    Historical Provider, MD     Positive ROS: none  All other systems have been reviewed and were otherwise negative with the exception of those mentioned in the HPI and as above.  Physical Exam: Filed Vitals:   03/29/12 0913  BP: 133/75  Pulse: 56  Temp: 97.4 F (36.3 C)  Resp: 16    General: Alert, no acute distress Cardiovascular: No pedal edema Respiratory: No cyanosis, no use of accessory musculature GI: No organomegaly, abdomen is soft and non-tender Skin: No lesions in the area of chief complaint Neurologic: Sensation intact distally Psychiatric: Patient is competent for consent with normal mood and affect Lymphatic: No axillary or cervical lymphadenopathy  MUSCULOSKELETAL: painful prominent screw over l. foot   Assessment/Plan: Painful Screw Left Foot Plan for Procedure(s): HARDWARE REMOVAL  The risks benefits and alternatives were discussed with the patient including but not limited to the risks of nonoperative treatment, versus surgical intervention including infection, bleeding, nerve injury, malunion, nonunion, hardware prominence, hardware failure, need for hardware removal, blood clots, cardiopulmonary complications, morbidity, mortality, among others, and they were willing to proceed.  Predicted outcome is good, although there will be at least a six to nine month expected recovery.  Rishard Delange L, MD 03/29/2012 9:26 AM

## 2012-03-29 NOTE — Anesthesia Postprocedure Evaluation (Signed)
  Anesthesia Post-op Note  Patient: [name]  Procedure(s) Performed: Procedure(s) (LRB) with comments: HARDWARE REMOVAL (Left) - Screw Removal Left Foot  Patient Location: PACU  Anesthesia Type:General  Level of Consciousness: awake  Airway and Oxygen Therapy: Patient Spontanous Breathing and Patient connected to face mask oxygen  Post-op Pain: mild  Post-op Assessment: Post-op Vital signs reviewed  Post-op Vital Signs: Reviewed  Complications: No apparent anesthesia complications

## 2012-04-01 ENCOUNTER — Encounter (HOSPITAL_BASED_OUTPATIENT_CLINIC_OR_DEPARTMENT_OTHER): Payer: Self-pay | Admitting: Orthopedic Surgery

## 2012-04-01 NOTE — Op Note (Signed)
[name], WOMAC NO.:  [id number]  MEDICAL RECORD NO.:  [id number]  LOCATION:                                 FACILITY:  PHYSICIAN:  Harvie Junior, M.D.   DATE OF BIRTH:  Mar 26, 1934  DATE OF PROCEDURE:  03/29/2012 DATE OF DISCHARGE:                              OPERATIVE REPORT   PREOPERATIVE DIAGNOSIS:  Retained hardware, left foot.  POSTOPERATIVE DIAGNOSIS:  Retained hardware, left foot.  PROCEDURE:  Removal of painful and prominent screw, left foot.  SURGEON:  Harvie Junior, MD  ANESTHESIA:  IV sedation.  BRIEF HISTORY:  [name] is a 78 year old female with long history of severe arthritis at the great toe of her left foot.  She was treated with a fusion with a dorsal plating and a compression screw.  She had done well __________.  She was doing well but she began having some painful irritation from where the screw was impacting her shoe wear and because of this, we elected to bring her to the operating room for removal of prominent screw.  PROCEDURE:  The patient was taken to the operating room and after adequate level of anesthesia was obtained with general anesthetic, the patient was placed supine on the operating table.  The left foot was then prepped and draped in usual sterile fashion.  Following this, the leg was exsanguinated and the blood pressure tourniquet was inflated to 220 mmHg.  Following this, a small incision was made over the prominent screw, dissected down to the level of the screw and the screw was removed with a screwdriver.  The wound was irrigated, suctioned dried, closed with a single nylon interrupted suture.  Sterile compressive dressing was applied and the patient was taken to the recovery room and she was noted to be in satisfactory condition.  Estimated blood loss for the procedure was none.     Harvie Junior, M.D.     Ranae Plumber  D:  03/29/2012  T:  03/30/2012  Job:  [id number]

## 2012-05-13 DIAGNOSIS — G252 Other specified forms of tremor: Secondary | ICD-10-CM | POA: Insufficient documentation

## 2012-05-13 DIAGNOSIS — Z5181 Encounter for therapeutic drug level monitoring: Secondary | ICD-10-CM | POA: Insufficient documentation

## 2012-05-13 DIAGNOSIS — R269 Unspecified abnormalities of gait and mobility: Secondary | ICD-10-CM | POA: Insufficient documentation

## 2012-05-13 DIAGNOSIS — I619 Nontraumatic intracerebral hemorrhage, unspecified: Secondary | ICD-10-CM | POA: Insufficient documentation

## 2012-06-17 ENCOUNTER — Telehealth: Payer: Self-pay | Admitting: *Deleted

## 2012-06-17 NOTE — Telephone Encounter (Signed)
Pt would like to receive f/u care with Dr. Si Gaul.  Email sent to [facility] to arrange.

## 2012-06-20 ENCOUNTER — Other Ambulatory Visit: Payer: Self-pay | Admitting: Oncology

## 2012-06-29 ENCOUNTER — Encounter: Payer: Self-pay | Admitting: Internal Medicine

## 2012-06-29 ENCOUNTER — Telehealth: Payer: Self-pay | Admitting: Internal Medicine

## 2012-06-29 NOTE — Telephone Encounter (Signed)
s.w pt new Dr. ....Mohamed....pt. ok...sent letter and appt schedule for July

## 2012-07-24 ENCOUNTER — Encounter: Payer: Self-pay | Admitting: Certified Nurse Midwife

## 2012-07-25 ENCOUNTER — Ambulatory Visit (INDEPENDENT_AMBULATORY_CARE_PROVIDER_SITE_OTHER): Payer: Medicare Other | Admitting: Certified Nurse Midwife

## 2012-07-25 ENCOUNTER — Encounter: Payer: Self-pay | Admitting: Certified Nurse Midwife

## 2012-07-25 VITALS — BP 100/56 | Ht 65.5 in | Wt 136.0 lb

## 2012-07-25 DIAGNOSIS — Z01419 Encounter for gynecological examination (general) (routine) without abnormal findings: Secondary | ICD-10-CM

## 2012-07-25 NOTE — Progress Notes (Addendum)
79 y.o. MarriedCaucasian female  Menopausal no HRT.  Denies vaginal bleeding or vaginal dryness.  Currently uses Olive Oil for moisture. Also has care provider who helps with home care with patient and spouse  Patient diagnosed with heart murmur being followed by Dr. Earl Gala, No treatment at present.  Thyroid stable on med.  Still sees oncology from previous breast  cancer bilaterally and colon cancer once monthly. Completed 3 months PT for rehab from stroke In 2012.   [id number] here for annual exam. Patient's last menstrual period was 06/08/1986.          Sexually active: no  The current method of family planning is none.    Exercising: yes  Home exercise routine includes walking hrs per day. Last mammogram:  2005 Last pap smear:06/28/11 Smoking:no Alcohol: no Last colonoscopy: 07/2010 Last Bone Density:  2011  Hgb:   pcp             Urine: pcp    Health Maintenance  Topic Date Due  . Influenza Vaccine  01/06/1935  . Colonoscopy  03/05/1986  . Zostavax  03/05/1996  . Pneumococcal Polysaccharide Vaccine Age 60 And Over  03/05/2001  . Tetanus/tdap  05/08/2014    History reviewed. No pertinent family history.  There is no problem list on file for this patient.   Past Medical History  Diagnosis Date  . PE (pulmonary embolism)   . Subarachnoid hemorrhage   . Spastic hemiplegia affecting nondominant side   . Intracerebral hemorrhage   . Hypertension   . Arthritis   . Stroke 3/12  . Gastrointestinal problem   . Depression   . Breast CA     mastectomy  . Colon cancer   . Tremor     benign essential  . Rheumatoid arthritis   . Hypothyroidism   . Dysrhythmia     palpitation  . Lymphedema of arm     right  . Atrophic vaginitis   . Osteoporosis   . Multinodular goiter   . Menieres disease 2002  . Thrombocytopenia 2002  . Gastroparesis 7/03  . Colon cancer 2007  . Pulmonary embolism 2003    chronic coumadin  . CVA (cerebral vascular accident)     Past  Surgical History  Procedure Laterality Date  . Fracture surgery  2010    rt wrist  . Mastectomy      bilat mastectomies  . Breast surgery    . Joint replacement    . Hardware removal  03/29/2012    Procedure: HARDWARE REMOVAL;  Surgeon: Harvie Junior, MD;  Location: [facility];  Service: Orthopedics;  Laterality: Left;  Screw Removal Left Foot  . Shoulder arthroscopy Right   . Thyroidectomy    . Total hip arthroplasty    . Cholecystectomy      Allergies: Demerol; Fentanyl; Metoclopramide hcl; Morphine and related; Nsaids; Pentazocine; Pentazocine lactate; and Versed  Current Outpatient Prescriptions  Medication Sig Dispense Refill  . acetaminophen (TYLENOL) 500 MG tablet Take 500 mg by mouth 4 (four) times daily.      . Ascorbic Acid (VITAMIN C) 1000 MG tablet Take 1,000 mg by mouth daily.      Marland Kitchen aspirin 81 MG tablet Take 81 mg by mouth daily.      Marland Kitchen atenolol (TENORMIN) 25 MG tablet Take 25 mg by mouth daily.      . calcium carbonate (TUMS EX) 750 MG chewable tablet Chew 4 tablets by mouth 2 (two) times daily.      Marland Kitchen  cholecalciferol (VITAMIN D) 1000 UNITS tablet Take 4,000 Units by mouth daily.      . colestipol (COLESTID) 1 G tablet Take 1 tablet (1 g total) by mouth See admin instructions. Take 1-2 tabs, two to three times per day  Brand name only  540 tablet  0  . diclofenac (FLECTOR) 1.3 % PTCH Place 1-2 patches onto the skin 2 (two) times daily.      Marland Kitchen HYDROmorphone (DILAUDID) 2 MG tablet Take 2 mg by mouth every 4 (four) hours as needed.      . hydroxychloroquine (PLAQUENIL) 200 MG tablet Take 200 mg by mouth 2 (two) times daily.      Marland Kitchen lactase-rennet 25-2 MG tablet Take 4 tablets by mouth 3 (three) times daily before meals.      Marland Kitchen levothyroxine (SYNTHROID, LEVOTHROID) 200 MCG tablet Take 200 mcg by mouth daily.      Marland Kitchen lidocaine (LIDODERM) 5 % Place 1 patch onto the skin daily. Remove & Discard patch within 12 hours or as directed by MD      . LORazepam (ATIVAN)  0.5 MG tablet Take 0.5 mg by mouth every morning.      . Multiple Vitamin (MULTIVITAMIN) tablet Take 1 tablet by mouth daily.      . predniSONE (DELTASONE) 5 MG tablet Take 5 mg by mouth daily.      . primidone (MYSOLINE) 250 MG tablet Take 125 mg by mouth 3 (three) times daily.      . sertraline (ZOLOFT) 100 MG tablet Take 100 mg by mouth daily.      . vitamin E (VITAMIN E) 400 UNIT capsule Take 400 Units by mouth daily.       No current facility-administered medications for this visit.    ROS: Pertinent items are noted in HPI.  Exam:    BP 100/56  Ht 5' 5.5" (1.664 m)  Wt 136 lb (61.689 kg)  BMI 22.28 kg/m2  LMP 06/08/1986   Wt Readings from Last 3 Encounters:  07/25/12 136 lb (61.689 kg)  03/29/12 139 lb (63.05 kg)  03/29/12 139 lb (63.05 kg)     Ht Readings from Last 3 Encounters:  07/25/12 5' 5.5" (1.664 m)  03/29/12 5\' 6"  (1.676 m)  03/29/12 5\' 6"  (1.676 m)    General appearance: alert, cooperative and appears stated age. Ambulates with walker well Head: Normocephalic, without obvious abnormality, atraumatic Neck: no adenopathy, supple, symmetrical, trachea midline and thyroid not enlarged, symmetric, no tenderness/mass/nodules Lungs: clear to auscultation bilaterally Breasts: Inspection negative, No breast mound present or nipple bilaterally, No axillary or supraclavicular adenopathy, Normal to palpation without dominant masses Heart: regular rate and rhythm grade 2-3 systolic murmur (known) Abdomen: soft, non-tender; bowel sounds normal; no masses,  no organomegaly Extremities: extremities normal, atraumatic, no cyanosis or edema Skin: Skin color, texture, turgor normal. No rashes or lesions Lymph nodes: Cervical, supraclavicular, and axillary nodes normal. No abnormal inguinal nodes palpated Neurologic: Grossly normal   Pelvic: External genitalia:  no lesions, atrophic              Urethra:  normal appearing urethra with no masses, tenderness or lesions               Bartholins and Skenes: normal                 Vagina: normal appearing vagina with normal color and discharge, no lesions, atrophic              Cervix: normal  appearance              Pap taken: yes        Bimanual Exam:  Uterus:  uterus is normal size, nodular area palpated and nontender  (known fibroid no change)                                      Adnexa: normal adnexa in size, nontender and no masses                                      Rectovaginal: Confirms                                      Anus:  normal sphincter tone, no lesions  A:1-Well woman Exam  2-Menopausal no HRT 3-Hx Breast Cancer bilaterally, colon cancer under oncology follow up 4-Hypothyroid stable med 5- Hx Stroke with recovery in past two years     P:1- Pap smear annualy  Reviewed health and wellness as indicated for age and medical conditions 2-Discussed importance of notifying if vaginal bleeding 3-Continue follow -up with oncology as indicated.  Discussed genetic counseling regarding breast and colon cancer and possible benefits with her siblings.  Does not desire genetic screening, has been aware of for several years.  Declines further information. 4-Continue MD Follow up as indicated Return annually or prn     An After Visit Summary was printed and given to the patient.   Reviewed, TL

## 2012-07-28 ENCOUNTER — Encounter: Payer: Self-pay | Admitting: Certified Nurse Midwife

## 2012-08-09 ENCOUNTER — Encounter: Payer: Self-pay | Admitting: Certified Nurse Midwife

## 2012-10-07 ENCOUNTER — Other Ambulatory Visit: Payer: Self-pay | Admitting: Neurology

## 2012-10-14 ENCOUNTER — Ambulatory Visit: Payer: Medicare Other | Admitting: Neurology

## 2012-10-14 ENCOUNTER — Encounter: Payer: Self-pay | Admitting: Neurology

## 2012-10-14 DIAGNOSIS — G250 Essential tremor: Secondary | ICD-10-CM

## 2012-10-14 DIAGNOSIS — I619 Nontraumatic intracerebral hemorrhage, unspecified: Secondary | ICD-10-CM

## 2012-10-14 DIAGNOSIS — Z5181 Encounter for therapeutic drug level monitoring: Secondary | ICD-10-CM

## 2012-10-14 DIAGNOSIS — R269 Unspecified abnormalities of gait and mobility: Secondary | ICD-10-CM

## 2012-10-15 ENCOUNTER — Ambulatory Visit (INDEPENDENT_AMBULATORY_CARE_PROVIDER_SITE_OTHER): Payer: Medicare Other | Admitting: Neurology

## 2012-10-15 ENCOUNTER — Encounter: Payer: Self-pay | Admitting: Neurology

## 2012-10-15 VITALS — BP 120/67 | HR 66 | Wt 130.0 lb

## 2012-10-15 DIAGNOSIS — G250 Essential tremor: Secondary | ICD-10-CM

## 2012-10-15 DIAGNOSIS — R269 Unspecified abnormalities of gait and mobility: Secondary | ICD-10-CM

## 2012-10-15 DIAGNOSIS — Z5181 Encounter for therapeutic drug level monitoring: Secondary | ICD-10-CM

## 2012-10-15 DIAGNOSIS — G252 Other specified forms of tremor: Secondary | ICD-10-CM

## 2012-10-15 MED ORDER — LEVOTHYROXINE SODIUM 200 MCG PO TABS: ORAL_TABLET | ORAL | Status: DC

## 2012-10-15 NOTE — Progress Notes (Signed)
Reason for visit: Tremor  [name] is an 79 y.o. female  History of present illness:  [name] is a 79 year old right-handed white female with a history of a tremor. The patient has a chronic gait disorder associated with cerebrovascular disease. The patient continues to have ongoing problems with tremor, and she notes that the tremor is worse in the morning and in the evenings. The patient may have difficulty getting dressed because of the tremor. The patient mainly talks about how her husband needs medication, and he and how her husband controls her behavior. The patient has not noted any new medical issues that have come up since last seen. The patient recently was increased on the lorazepam.  Past Medical History  Diagnosis Date  . PE (pulmonary embolism)   . Subarachnoid hemorrhage   . Spastic hemiplegia affecting nondominant side   . Intracerebral hemorrhage   . Hypertension   . Arthritis   . Stroke 3/12  . Gastrointestinal problem   . Depression   . Breast CA     mastectomy  . Colon cancer   . Tremor     benign essential  . Rheumatoid arthritis(714.0)   . Hypothyroidism   . Dysrhythmia     palpitation  . Lymphedema of arm     right  . Atrophic vaginitis   . Osteoporosis   . Multinodular goiter   . Menieres disease 2002  . Thrombocytopenia 2002  . Gastroparesis 7/03  . Colon cancer 2007  . Pulmonary embolism 2003    chronic coumadin  . CVA (cerebral vascular accident)     Past Surgical History  Procedure Laterality Date  . Fracture surgery  2010    rt wrist  . Mastectomy      bilat mastectomies  . Breast surgery    . Joint replacement      Bilateral total hip replacement  . Hardware removal  03/29/2012    Procedure: HARDWARE REMOVAL;  Surgeon: Harvie Junior, MD;  Location: [facility];  Service: Orthopedics;  Laterality: Left;  Screw Removal Left Foot  . Shoulder arthroscopy Right   . Thyroidectomy    . Total hip arthroplasty     . Cholecystectomy    . Tonsillectomy    . Left ankle      ruptured achilles tendon  . Carpal tunnel release Bilateral   . Thoracic outlet surgery Bilateral   . Ulnar nerve transposition Right   . Knee surgery Bilateral   . Lumbar laminectomy    . Nasal sinus surgery    . Cervical laminectomy    . Bunionectomy Right   . Right wrist    . Squamous cell carcinoma resec      Family History  Problem Relation Age of Onset  . Cancer Father   . Heart failure Sister   . Tremor Maternal Grandmother   . Tremor Maternal Grandfather   . Tremor Paternal Grandmother   . Tremor Paternal Grandfather     Social history:  reports that she quit smoking about 42 years ago. She does not have any smokeless tobacco history on file. She reports that she drinks about 1.5 ounces of alcohol per week. She reports that she does not use illicit drugs.  Allergies:  Allergies  Allergen Reactions  . Demerol   . Fentanyl   . Metoclopramide Hcl   . Morphine And Related   . Nsaids   . Pentazocine   . Pentazocine Lactate   . Versed (Midazolam)  Medications:  Current Outpatient Prescriptions on File Prior to Visit  Medication Sig Dispense Refill  . acetaminophen (TYLENOL) 500 MG tablet Take 500 mg by mouth 4 (four) times daily.      . Ascorbic Acid (VITAMIN C) 1000 MG tablet Take 1,000 mg by mouth daily.      Marland Kitchen aspirin 81 MG tablet Take 81 mg by mouth daily.      Marland Kitchen atenolol (TENORMIN) 25 MG tablet Take 25 mg by mouth daily.      . calcium carbonate (TUMS EX) 750 MG chewable tablet Chew 4 tablets by mouth 2 (two) times daily.      . colestipol (COLESTID) 1 G tablet Take 1 tablet (1 g total) by mouth See admin instructions. Take 1-2 tabs, two to three times per day  Brand name only  540 tablet  0  . diclofenac (FLECTOR) 1.3 % PTCH Place 1-2 patches onto the skin 2 (two) times daily.      Marland Kitchen HYDROmorphone (DILAUDID) 2 MG tablet Take 2 mg by mouth every 4 (four) hours as needed.      . hydroxychloroquine  (PLAQUENIL) 200 MG tablet Take 200 mg by mouth 2 (two) times daily.      Marland Kitchen lactase-rennet 25-2 MG tablet Take 4 tablets by mouth 3 (three) times daily before meals.      . lidocaine (LIDODERM) 5 % Place 1 patch onto the skin daily. Remove & Discard patch within 12 hours or as directed by MD      . LORazepam (ATIVAN) 0.5 MG tablet Take 1 mg by mouth 2 (two) times daily.       . Multiple Vitamin (MULTIVITAMIN) tablet Take 1 tablet by mouth daily.      . predniSONE (DELTASONE) 5 MG tablet Take 5 mg by mouth daily.      . primidone (MYSOLINE) 250 MG tablet TAKE 1/2 TABLET BY MOUTH EVERY MORNING AND TAKE 1 TABLET BY MOUTH IN THE EVENING  135 tablet  1  . sertraline (ZOLOFT) 100 MG tablet Take 150 mg by mouth daily.       . vitamin E (VITAMIN E) 400 UNIT capsule Take 400 Units by mouth daily.       No current facility-administered medications on file prior to visit.    ROS:  Out of a complete 14 system review of symptoms, the patient complains only of the following symptoms, and all other reviewed systems are negative.  Weight loss, fatigue Palpitations, heart murmur Hearing loss, ringing in the ears, dizziness Shortness of breath Incontinence of stool and constipation Feeling hot, cold Joint pain, joint swelling, muscle cramps, achy muscles Memory loss, confusion, headache, numbness, weakness, slurred speech Tremor Depression, anxiety, insomnia, decreased energy, change in appetite, racing thoughts  Blood pressure 120/67, pulse 66, weight 130 lb (58.968 kg), last menstrual period 06/08/1986.  Physical Exam  General: The patient is alert and cooperative at the time of the examination.  Skin: No significant peripheral edema is noted.   Neurologic Exam  Cranial nerves: Facial symmetry is present. Speech is normal, no aphasia or dysarthria is noted. Extraocular movements are full. Visual fields are full.  Motor: The patient has good strength in all 4 extremities.  Coordination: The  patient has good finger-nose-finger and heel-to-shin bilaterally. An intention tremor seen with finger-nose-finger bilaterally.  Gait and station: The patient has a wide-based, slightly unsteady gait. The patient uses a walker for ambulation. Tandem gait was not attempted. Romberg is negative. No drift is seen.  Reflexes: Deep tendon reflexes are symmetric.   Assessment/Plan:  1. Benign essential tremor  2. Gait disorder  3. Cerebrovascular disease  The patient is relatively stable while using a walker. The patient will have blood work drawn today, and she will followup in 6-8 months. The patient still has some issues with the tremor, but she is near her maximum on her primidone dose. I am concerned that increasing the medication, for this may result in a deterioration in her gait.  Marlan Palau MD 10/15/2012 7:35 PM  [facility] [address] [place], [place] [address]  Phone [phone] Fax [phone]

## 2012-10-16 LAB — COMPREHENSIVE METABOLIC PANEL
AST: 22 IU/L (ref 0–40)
Albumin/Globulin Ratio: 2.3 (ref 1.1–2.5)
Albumin: 4.3 g/dL (ref 3.5–4.8)
Alkaline Phosphatase: 63 IU/L (ref 39–117)
BUN/Creatinine Ratio: 15 (ref 11–26)
BUN: 11 mg/dL (ref 8–27)
Creatinine, Ser: 0.71 mg/dL (ref 0.57–1.00)
GFR calc Af Amer: 96 mL/min/{1.73_m2} (ref >59)
GFR calc non Af Amer: 83 mL/min/{1.73_m2} (ref >59)
Globulin, Total: 1.9 g/dL (ref 1.5–4.5)
Sodium: 139 mmol/L (ref 134–144)

## 2012-10-16 LAB — CBC WITH DIFFERENTIAL
Basos: 0 % (ref 0–3)
Eos: 1 % (ref 0–5)
Eosinophils Absolute: 0.1 10*3/uL (ref 0.0–0.4)
Immature Grans (Abs): 0 10*3/uL (ref 0.0–0.1)
Immature Granulocytes: 0 % (ref 0–2)
Lymphocytes Absolute: 0.9 10*3/uL (ref 0.7–3.1)
MCH: 33.5 pg — ABNORMAL HIGH (ref 26.6–33.0)
MCHC: 34 g/dL (ref 31.5–35.7)
MCV: 99 fL — ABNORMAL HIGH (ref 79–97)
Monocytes Absolute: 0.3 10*3/uL (ref 0.1–0.9)
Neutrophils Relative %: 75 % — ABNORMAL HIGH (ref 40–74)
Platelets: 137 10*3/uL — ABNORMAL LOW (ref 155–379)
RBC: 3.79 x10E6/uL (ref 3.77–5.28)

## 2012-10-16 NOTE — Progress Notes (Signed)
Quick Note:  I called and gave results of labs to husband, he will relate to pt. ______

## 2012-10-29 ENCOUNTER — Other Ambulatory Visit (HOSPITAL_BASED_OUTPATIENT_CLINIC_OR_DEPARTMENT_OTHER): Payer: Medicare Other | Admitting: Lab

## 2012-10-29 DIAGNOSIS — C189 Malignant neoplasm of colon, unspecified: Secondary | ICD-10-CM

## 2012-10-29 DIAGNOSIS — C50919 Malignant neoplasm of unspecified site of unspecified female breast: Secondary | ICD-10-CM

## 2012-10-29 DIAGNOSIS — E559 Vitamin D deficiency, unspecified: Secondary | ICD-10-CM

## 2012-10-29 DIAGNOSIS — F329 Major depressive disorder, single episode, unspecified: Secondary | ICD-10-CM

## 2012-10-29 LAB — COMPREHENSIVE METABOLIC PANEL (CC13)
Albumin: 3.5 g/dL (ref 3.5–5.0)
BUN: 10.4 mg/dL (ref 7.0–26.0)
CO2: 29 mEq/L (ref 22–29)
Calcium: 8.9 mg/dL (ref 8.4–10.4)
Chloride: 103 mEq/L (ref 98–107)
Creatinine: 0.8 mg/dL (ref 0.6–1.1)
Glucose: 142 mg/dl — ABNORMAL HIGH (ref 70–99)
Potassium: 3.7 mEq/L (ref 3.5–5.1)

## 2012-10-29 LAB — CBC WITH DIFFERENTIAL/PLATELET
Eosinophils Absolute: 0.1 10*3/uL (ref 0.0–0.5)
HCT: 36.7 % (ref 34.8–46.6)
LYMPH%: 17.8 % (ref 14.0–49.7)
MCV: 98.6 fL (ref 79.5–101.0)
MONO#: 0.6 10*3/uL (ref 0.1–0.9)
MONO%: 8.5 % (ref 0.0–14.0)
NEUT#: 4.8 10*3/uL (ref 1.5–6.5)
NEUT%: 72 % (ref 38.4–76.8)
Platelets: 114 10*3/uL — ABNORMAL LOW (ref 145–400)
RBC: 3.73 10*6/uL (ref 3.70–5.45)
WBC: 6.6 10*3/uL (ref 3.9–10.3)

## 2012-11-05 ENCOUNTER — Ambulatory Visit (HOSPITAL_BASED_OUTPATIENT_CLINIC_OR_DEPARTMENT_OTHER): Payer: Medicare Other | Admitting: Internal Medicine

## 2012-11-05 ENCOUNTER — Encounter: Payer: Self-pay | Admitting: Internal Medicine

## 2012-11-05 ENCOUNTER — Ambulatory Visit: Payer: Medicare Other | Admitting: Oncology

## 2012-11-05 ENCOUNTER — Telehealth: Payer: Self-pay | Admitting: Internal Medicine

## 2012-11-05 VITALS — BP 113/64 | HR 66 | Temp 98.0°F | Resp 18 | Ht 65.5 in | Wt 132.2 lb

## 2012-11-05 DIAGNOSIS — Z853 Personal history of malignant neoplasm of breast: Secondary | ICD-10-CM | POA: Insufficient documentation

## 2012-11-05 DIAGNOSIS — C189 Malignant neoplasm of colon, unspecified: Secondary | ICD-10-CM | POA: Insufficient documentation

## 2012-11-05 NOTE — Progress Notes (Signed)
[facility] [phone]   [phone]  OFFICE PROGRESS NOTE  [name], MD [address] [place], [address] [facility], [place]. [place] [address]  DIAGNOSIS:  1) Malignant neoplasm of breast (female), unspecified site 1985, 2005 on femara 2.5 mg/day  2) Colon cancer T3N0. 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  INTERVAL HISTORY: [name] 79 y.o. female returns to the clinic today for followup visit accompanied by her husband. She is a former patient of Dr. Donnie Coffin who is here today to establish care with me after he left the practice. The patient is feeling fine today with no specific complaints except for generalized fatigue and weakness. She still has depression and currently on Zoloft. The patient denied having any significant chest pain but continues to have shortness breath with exertion, no cough or hemoptysis. She has no significant weight loss or night sweats. She has been followed by observation by Dr. Donnie Coffin for the last few years for her history of breast cancer as well as colon cancer.  MEDICAL HISTORY: Past Medical History  Diagnosis Date  . PE (pulmonary embolism)   . Subarachnoid hemorrhage   . Spastic hemiplegia affecting nondominant side   . Intracerebral hemorrhage   . Hypertension   . Arthritis   . Stroke 3/12  . Gastrointestinal problem   . Depression   . Breast CA     mastectomy  . Colon cancer   . Tremor     benign essential  . Rheumatoid arthritis(714.0)   . Hypothyroidism   . Dysrhythmia     palpitation  . Lymphedema of arm     right  . Atrophic vaginitis   . Osteoporosis   . Multinodular goiter   . Menieres disease 2002  . Thrombocytopenia 2002  . Gastroparesis 7/03  . Colon cancer 2007  . Pulmonary embolism 2003    chronic coumadin  . CVA  (cerebral vascular accident)     ALLERGIES:  is allergic to demerol; fentanyl; metoclopramide hcl; morphine and related; nsaids; pentazocine; pentazocine lactate; and versed.  MEDICATIONS:  Current Outpatient Prescriptions  Medication Sig Dispense Refill  . acetaminophen (TYLENOL) 500 MG tablet Take 500 mg by mouth 4 (four) times daily.      . Ascorbic Acid (VITAMIN C) 1000 MG tablet Take 1,000 mg by mouth daily.      Marland Kitchen aspirin 81 MG tablet Take 81 mg by mouth daily.      Marland Kitchen atenolol (TENORMIN) 25 MG tablet Take 25 mg by mouth daily.      . calcium carbonate (TUMS EX) 750 MG chewable tablet Chew 4 tablets by mouth 2 (two) times daily.      . Cholecalciferol (VITAMIN D3) 5000 UNITS CAPS Take 5,000 Units by mouth daily.      . colestipol (COLESTID) 1 G tablet Take 1 tablet (1 g total) by mouth See admin instructions. Take 1-2 tabs, two to three times per day  Brand name only  540 tablet  0  . diclofenac (FLECTOR) 1.3 % PTCH Place 1-2 patches onto the skin 2 (two) times daily.      Marland Kitchen HYDROmorphone (DILAUDID) 2 MG tablet Take 2 mg by mouth every 4 (four) hours as needed.      . hydroxychloroquine (PLAQUENIL) 200 MG tablet Take 200 mg by mouth 2 (two) times  daily.      . lactase-rennet 25-2 MG tablet Take 4 tablets by mouth 3 (three) times daily before meals.      Marland Kitchen levothyroxine (SYNTHROID) 200 MCG tablet Two tablets daily, except 1.5 tablets on Monday, Wednesday, and Friday      . lidocaine (LIDODERM) 5 % Place 1 patch onto the skin daily. Remove & Discard patch within 12 hours or as directed by MD      . LORazepam (ATIVAN) 0.5 MG tablet Take 1 mg by mouth 2 (two) times daily.       . Multiple Vitamin (MULTIVITAMIN) tablet Take 1 tablet by mouth daily.      . predniSONE (DELTASONE) 5 MG tablet Take 5 mg by mouth daily.      . primidone (MYSOLINE) 250 MG tablet TAKE 1/2 TABLET BY MOUTH EVERY MORNING AND TAKE 1 TABLET BY MOUTH IN THE EVENING  135 tablet  1  . sertraline (ZOLOFT) 100 MG tablet Take  150 mg by mouth daily.       . vitamin E (VITAMIN E) 400 UNIT capsule Take 400 Units by mouth daily.       No current facility-administered medications for this visit.    SURGICAL HISTORY:  Past Surgical History  Procedure Laterality Date  . Fracture surgery  2010    rt wrist  . Mastectomy      bilat mastectomies  . Breast surgery    . Joint replacement      Bilateral total hip replacement  . Hardware removal  03/29/2012    Procedure: HARDWARE REMOVAL;  Surgeon: Harvie Junior, MD;  Location: [facility];  Service: Orthopedics;  Laterality: Left;  Screw Removal Left Foot  . Shoulder arthroscopy Right   . Thyroidectomy    . Total hip arthroplasty    . Cholecystectomy    . Tonsillectomy    . Left ankle      ruptured achilles tendon  . Carpal tunnel release Bilateral   . Thoracic outlet surgery Bilateral   . Ulnar nerve transposition Right   . Knee surgery Bilateral   . Lumbar laminectomy    . Nasal sinus surgery    . Cervical laminectomy    . Bunionectomy Right   . Right wrist    . Squamous cell carcinoma resec      REVIEW OF SYSTEMS:  A comprehensive review of systems was negative except for: Constitutional: positive for fatigue Respiratory: positive for dyspnea on exertion Musculoskeletal: positive for muscle weakness   PHYSICAL EXAMINATION: General appearance: alert, cooperative, fatigued and no distress Head: Normocephalic, without obvious abnormality, atraumatic Neck: no adenopathy Lymph nodes: Cervical, supraclavicular, and axillary nodes normal. Resp: clear to auscultation bilaterally Cardio: regular rate and rhythm, S1, S2 normal, no murmur, click, rub or gallop GI: soft, non-tender; bowel sounds normal; no masses,  no organomegaly Extremities: extremities normal, atraumatic, no cyanosis or edema Neurologic: Alert and oriented X 3, normal strength and tone. Normal symmetric reflexes. Normal coordination and gait  ECOG PERFORMANCE STATUS: 2 -  Symptomatic, <50% confined to bed  Last menstrual period 06/08/1986.  LABORATORY DATA: Lab Results  Component Value Date   WBC 6.6 10/29/2012   HGB 12.6 10/29/2012   HCT 36.7 10/29/2012   MCV 98.6 10/29/2012   PLT 114* 10/29/2012      Chemistry      Component Value Date/Time   NA 141 10/29/2012 1414   NA 139 10/15/2012 1308   NA 137 02/27/2012 1600   K  3.7 10/29/2012 1414   K 4.0 10/15/2012 1308   CL 103 10/29/2012 1414   CL 100 10/15/2012 1308   CO2 29 10/29/2012 1414   CO2 26 10/15/2012 1308   BUN 10.4 10/29/2012 1414   BUN 11 10/15/2012 1308   BUN 13 02/27/2012 1600   CREATININE 0.8 10/29/2012 1414   CREATININE 0.71 10/15/2012 1308      Component Value Date/Time   CALCIUM 8.9 10/29/2012 1414   CALCIUM 8.8 10/15/2012 1308   ALKPHOS 70 10/29/2012 1414   ALKPHOS 63 10/15/2012 1308   AST 21 10/29/2012 1414   AST 22 10/15/2012 1308   ALT 12 10/29/2012 1414   ALT 17 10/15/2012 1308   BILITOT 0.38 10/29/2012 1414   BILITOT 0.3 10/15/2012 1308       RADIOGRAPHIC STUDIES: No results found.  ASSESSMENT AND PLAN:  This is a very pleasant 79 years old white female with history of recurrent breast carcinoma initially diagnosed in 1985 with recurrence in 2005 treated with Femara. She also has a history of colon cancer status post resection followed by 3 cycles of chemotherapy discontinued secondary to intolerance. The patient has been observation for the last few years with no significant evidence for disease recurrence. I discussed the lab result with the patient and her husband today. I recommended for her to continue on observation with repeat blood work and chest x-ray in one year. She was advised to call immediately she has any concerning symptoms in the interval. All questions were answered. The patient knows to call the clinic with any problems, questions or concerns. We can certainly see the patient much sooner if necessary.  I spent 20 minutes counseling the patient face to face. The  total time spent in the appointment was 30 minutes.

## 2012-11-05 NOTE — Patient Instructions (Signed)
Your labwork is good today. Followup visit in one year with repeat blood work as well as chest x-ray.

## 2012-11-05 NOTE — Telephone Encounter (Signed)
gv and printed appt sched and avs  °

## 2012-12-10 ENCOUNTER — Other Ambulatory Visit (HOSPITAL_COMMUNITY): Payer: Self-pay | Admitting: Psychiatry

## 2013-04-29 ENCOUNTER — Ambulatory Visit (INDEPENDENT_AMBULATORY_CARE_PROVIDER_SITE_OTHER): Payer: Medicare Other | Admitting: Neurology

## 2013-04-29 ENCOUNTER — Encounter: Payer: Self-pay | Admitting: Neurology

## 2013-04-29 ENCOUNTER — Encounter (INDEPENDENT_AMBULATORY_CARE_PROVIDER_SITE_OTHER): Payer: Self-pay

## 2013-04-29 VITALS — BP 108/64 | HR 62 | Wt 131.0 lb

## 2013-04-29 DIAGNOSIS — G250 Essential tremor: Secondary | ICD-10-CM

## 2013-04-29 DIAGNOSIS — R269 Unspecified abnormalities of gait and mobility: Secondary | ICD-10-CM

## 2013-04-29 NOTE — Progress Notes (Signed)
Reason for visit: Tremor  [name] is an 79 y.o. female  History of present illness:  [name] is a 79 year old right-handed white female with a history of an essential tremor. The patient has difficulty feeding herself, and occasionally with getting dressed. The patient gets Ativan 0.5 mg twice daily, and she is on primidone taking 250 milligrams at night, 125 mg in the morning. The patient has noted onset of tingling on the left arm and leg since September 2014. The patient has problems with walking, and she uses a walker to get around. The patient has a lot of fatigue during the day. The patient has been getting physical and occupational therapy at her extended care facility, [facility]. The patient does not believe that she is getting much benefit from physical therapy. The patient is under some stress at this point as her husband went to the hospital with a possible pneumonia. The patient returns for an evaluation.  Past Medical History  Diagnosis Date  . PE (pulmonary embolism)   . Subarachnoid hemorrhage   . Spastic hemiplegia affecting nondominant side   . Intracerebral hemorrhage   . Hypertension   . Arthritis   . Stroke 3/12  . Gastrointestinal problem   . Depression   . Breast CA     mastectomy  . Colon cancer   . Tremor     benign essential  . Rheumatoid arthritis(714.0)   . Hypothyroidism   . Dysrhythmia     palpitation  . Lymphedema of arm     right  . Atrophic vaginitis   . Osteoporosis   . Multinodular goiter   . Menieres disease 2002  . Thrombocytopenia 2002  . Gastroparesis 7/03  . Colon cancer 2007  . Pulmonary embolism 2003    chronic coumadin  . CVA (cerebral vascular accident)     Past Surgical History  Procedure Laterality Date  . Fracture surgery  2010    rt wrist  . Mastectomy      bilat mastectomies  . Breast surgery    . Joint replacement      Bilateral total hip replacement  . Hardware removal  03/29/2012    Procedure:  HARDWARE REMOVAL;  Surgeon: Harvie Junior, MD;  Location: [facility];  Service: Orthopedics;  Laterality: Left;  Screw Removal Left Foot  . Shoulder arthroscopy Right   . Thyroidectomy    . Total hip arthroplasty    . Cholecystectomy    . Tonsillectomy    . Left ankle      ruptured achilles tendon  . Carpal tunnel release Bilateral   . Thoracic outlet surgery Bilateral   . Ulnar nerve transposition Right   . Knee surgery Bilateral   . Lumbar laminectomy    . Nasal sinus surgery    . Cervical laminectomy    . Bunionectomy Right   . Right wrist    . Squamous cell carcinoma resec      Family History  Problem Relation Age of Onset  . Cancer Father   . Heart failure Sister   . Tremor Maternal Grandmother   . Tremor Maternal Grandfather   . Tremor Paternal Grandmother   . Tremor Paternal Grandfather     Social history:  reports that she quit smoking about 43 years ago. She has never used smokeless tobacco. She reports that she drinks about 1.5 ounces of alcohol per week. She reports that she does not use illicit drugs.    Allergies  Allergen Reactions  . Demerol   . Fentanyl   . Metoclopramide Hcl   . Morphine And Related   . Nsaids   . Pentazocine   . Pentazocine Lactate   . Versed [Midazolam]     Medications:  Current Outpatient Prescriptions on File Prior to Visit  Medication Sig Dispense Refill  . aspirin 81 MG tablet Take 81 mg by mouth daily.      Marland Kitchen atenolol (TENORMIN) 25 MG tablet Take 25 mg by mouth daily.      . colestipol (COLESTID) 1 G tablet Take 1 tablet (1 g total) by mouth See admin instructions. Take 1-2 tabs, two to three times per day  Brand name only  540 tablet  0  . HYDROmorphone (DILAUDID) 2 MG tablet Take 2 mg by mouth every 4 (four) hours as needed.      . hydroxychloroquine (PLAQUENIL) 200 MG tablet Take 200 mg by mouth 2 (two) times daily.      Marland Kitchen LACTASE PO Take 25 mg by mouth. 2 tablets by mouth three times daily      .  levothyroxine (SYNTHROID) 200 MCG tablet Two tablets daily, except 1.5 tablets on Monday, Wednesday, and Friday      . LORazepam (ATIVAN) 0.5 MG tablet Take 1 mg by mouth 2 (two) times daily.       . sertraline (ZOLOFT) 100 MG tablet Take 100 mg by mouth daily.        No current facility-administered medications on file prior to visit.    ROS:  Out of a complete 14 system review of symptoms, the patient complains only of the following symptoms, and all other reviewed systems are negative.  Activity change, appetite change Cough Heart murmur Incontinence of bladder Joint pain, back pain, achy muscles, walking difficulty, coordination problem Memory loss, headache, weakness, tremors Agitation, confusion, depression, anxiety  Blood pressure 108/64, pulse 62, weight 131 lb (59.421 kg), last menstrual period 06/08/1986.  Physical Exam  General: The patient is alert and cooperative at the time of the examination.  Skin: No significant peripheral edema is noted.   Neurologic Exam  Mental status: The Mini-Mental status examination done today shows a total score 20/30.  Cranial nerves: Facial symmetry is present. Speech is normal, no aphasia or dysarthria is noted. Extraocular movements are full. Visual fields are full.  Motor: The patient has good strength in all 4 extremities.  Sensory examination: Soft touch sensation on the face, arms, and legs is symmetric.  Coordination: The patient has good finger-nose-finger and heel-to-shin bilaterally. The patient has an intention tremor with finger-nose-finger bilaterally.  Gait and station: The patient has an unsteady gait. The patient walks with a walker. Tandem gait was not attempted.. Romberg is negative. No drift is seen.  Reflexes: Deep tendon reflexes are symmetric.   Assessment/Plan:  1. Benign essential tremor  2. Gait disorder  The patient appears to be somewhat sedated today. The medication will be cut back to taking  125 mg twice daily of the Mysoline. The patient is to contact our office if the tremor significantly worsens. If this is the case, we will discontinue the Ativan, and convert to low-dose alprazolam just prior to meals. The patient will followup through this office in 6 months.  Marlan Palau MD 04/29/2013 7:08 PM  [facility] [address] [place], [place] [address]  Phone [phone] Fax [phone]

## 2013-04-29 NOTE — Patient Instructions (Signed)
Tremor  Tremor is a rhythmic, involuntary muscular contraction characterized by oscillations (to-and-fro movements) of a part of the body. The most common of all involuntary movements, tremor can affect various body parts such as the hands, head, facial structures, vocal cords, trunk, and legs; most tremors, however, occur in the hands. Tremor often accompanies neurological disorders associated with aging. Although the disorder is not life-threatening, it can be responsible for functional disability and social embarrassment.  TREATMENT   There are many types of tremor and several ways in which tremor is classified. The most common classification is by behavioral context or position. There are five categories of tremor within this classification: resting, postural, kinetic, task-specific, and psychogenic. Resting or static tremor occurs when the muscle is at rest, for example when the hands are lying on the lap. This type of tremor is often seen in patients with Parkinson's disease. Postural tremor occurs when a patient attempts to maintain posture, such as holding the hands outstretched. Postural tremors include physiological tremor, essential tremor, tremor with basal ganglia disease (also seen in patients with Parkinson's disease), cerebellar postural tremor, tremor with peripheral neuropathy, post-traumatic tremor, and alcoholic tremor. Kinetic or intention (action) tremor occurs during purposeful movement, for example during finger-to-nose testing. Task-specific tremor appears when performing goal-oriented tasks such as handwriting, speaking, or standing. This group consists of primary writing tremor, vocal tremor, and orthostatic tremor. Psychogenic tremor occurs in both older and younger patients. The key feature of this tremor is that it dramatically lessens or disappears when the patient is distracted.  PROGNOSIS  There are some treatment options available for tremor; the appropriate treatment depends on  accurate diagnosis of the cause. Some tremors respond to treatment of the underlying condition, for example in some cases of psychogenic tremor treating the patient's underlying mental problem may cause the tremor to disappear. Also, patients with tremor due to Parkinson's disease may be treated with Levodopa drug therapy. Symptomatic drug therapy is available for several other tremors as well. For those cases of tremor in which there is no effective drug treatment, physical measures such as teaching the patient to brace the affected limb during the tremor are sometimes useful. Surgical intervention such as thalamotomy or deep brain stimulation may be useful in certain cases.  Document Released: 04/14/2002 Document Revised: 07/17/2011 Document Reviewed: 04/24/2005  [employer]® Patient Information ©2014 [employer].

## 2013-06-19 ENCOUNTER — Telehealth: Payer: Self-pay | Admitting: Neurology

## 2013-06-19 MED ORDER — PRIMIDONE 250 MG PO TABS: 125.0000 mg | ORAL_TABLET | Freq: Two times a day (BID) | ORAL | Status: DC

## 2013-06-19 NOTE — Telephone Encounter (Signed)
Mark from [facility] calling to request refill for patient for Primidone. They faxed over request and are waiting on response. If questions, please call him back.

## 2013-06-19 NOTE — Telephone Encounter (Signed)
We have not gotten a fax from this pharmacy.  This is the first request we have received.  Rx has been sent.

## 2013-07-07 ENCOUNTER — Telehealth: Payer: Self-pay | Admitting: Neurology

## 2013-07-07 MED ORDER — PRIMIDONE 50 MG PO TABS
50.0000 mg | ORAL_TABLET | Freq: Every day | ORAL | Status: DC
Start: 2013-07-07 — End: 2013-07-29

## 2013-07-07 NOTE — Telephone Encounter (Signed)
I called the son. The patient was cut back on her prednisone from 125 in the morning and 250 in the evening as this was making her extremely drowsy. This dosing change occurred in December 2014. Currently, the tremor has become quite severe. The patient is not able to feed herself well. I'll add a 50 mg tablet in the evening dose of the Primidone.

## 2013-07-07 NOTE — Telephone Encounter (Signed)
Tried to reach son, left message that I would forward to doctor.  Not sure how long she has been on the lower dose of Primidone.

## 2013-07-07 NOTE — Telephone Encounter (Signed)
Dr. Jannifer Franklin changed dosage of Primidone--now patient is really shaking--please call.

## 2013-07-08 ENCOUNTER — Telehealth: Payer: Self-pay | Admitting: Neurology

## 2013-07-08 NOTE — Telephone Encounter (Signed)
Pharmacy needs clarification on dosage of primidone. Please call  Dessa Phi [phone] to clarify.

## 2013-07-08 NOTE — Telephone Encounter (Signed)
I called back.  Spoke with Sid.  He just wanted to verify if the patient should be taking the Rx sent today in addition to the other Rx that is on file.  Advised him per notes, Dr Jannifer Franklin is adding 50mg  to her nightly dose.

## 2013-07-29 ENCOUNTER — Encounter: Payer: Self-pay | Admitting: Certified Nurse Midwife

## 2013-07-29 ENCOUNTER — Ambulatory Visit (INDEPENDENT_AMBULATORY_CARE_PROVIDER_SITE_OTHER): Payer: 59 | Admitting: Certified Nurse Midwife

## 2013-07-29 VITALS — BP 110/64 | HR 68 | Resp 16 | Wt 120.0 lb

## 2013-07-29 DIAGNOSIS — Z124 Encounter for screening for malignant neoplasm of cervix: Secondary | ICD-10-CM

## 2013-07-29 DIAGNOSIS — Z01419 Encounter for gynecological examination (general) (routine) without abnormal findings: Secondary | ICD-10-CM

## 2013-07-29 NOTE — Patient Instructions (Signed)

## 2013-07-29 NOTE — Progress Notes (Signed)
80 y.o. [id number] Married Caucasian Fe here for annual exam. Menopausal no HRT. Patient denies any vaginal bleeding or vaginal dryness. Patient recovering stroke earlier in the year. She walks with walker and assistance. Residing now in Rehabilitation unit here in [place]. Spouse is at home, but visits daily. Patient is ready to be back home. She has to have assistance with dressing also. She now complains of incontinence at times due to stroke, but is improving. She maintains good bowel control. Patient does do self chest exams, due bilateral mastectomy and "has not felt anything but my bones, no change". Speech unimpaired from stroke. Sees PCP, cardiologist for exam, medication management. Does not see oncology anymore. No gyn health issues today.   Patient's last menstrual period was 06/08/1986.          Sexually active: no  The current method of family planning is post menopausal status.    Exercising: yes  walking Smoker:  no  Health Maintenance: Pap: 07-25-12 neg MMG:  2005 Colonoscopy: 2012 BMD:   2011 TDaP:  2014 Labs: none Self breast exam:done occ   reports that she quit smoking about 43 years ago. She has never used smokeless tobacco. She reports that she drinks alcohol. She reports that she does not use illicit drugs.  Past Medical History  Diagnosis Date  . PE (pulmonary embolism)   . Subarachnoid hemorrhage   . Spastic hemiplegia affecting nondominant side   . Intracerebral hemorrhage   . Hypertension   . Arthritis   . Stroke 3/12  . Gastrointestinal problem   . Depression   . Breast CA     mastectomy  . Colon cancer   . Tremor     benign essential  . Rheumatoid arthritis(714.0)   . Hypothyroidism   . Dysrhythmia     palpitation  . Lymphedema of arm     right  . Atrophic vaginitis   . Osteoporosis   . Multinodular goiter   . Menieres disease 2002  . Thrombocytopenia 2002  . Gastroparesis 7/03  . Colon cancer 2007  . Pulmonary embolism 2003    chronic  coumadin  . CVA (cerebral vascular accident)     Past Surgical History  Procedure Laterality Date  . Fracture surgery  2010    rt wrist  . Mastectomy      bilat mastectomies  . Breast surgery    . Joint replacement      Bilateral total hip replacement  . Hardware removal  03/29/2012    Procedure: HARDWARE REMOVAL;  Surgeon: Alta Corning, MD;  Location: [facility];  Service: Orthopedics;  Laterality: Left;  Screw Removal Left Foot  . Shoulder arthroscopy Right   . Thyroidectomy    . Total hip arthroplasty    . Cholecystectomy    . Tonsillectomy    . Left ankle      ruptured achilles tendon  . Carpal tunnel release Bilateral   . Thoracic outlet surgery Bilateral   . Ulnar nerve transposition Right   . Knee surgery Bilateral   . Lumbar laminectomy    . Nasal sinus surgery    . Cervical laminectomy    . Bunionectomy Right   . Right wrist    . Squamous cell carcinoma resec      Current Outpatient Prescriptions  Medication Sig Dispense Refill  . aspirin 81 MG tablet Take 81 mg by mouth daily.      Marland Kitchen atenolol (TENORMIN) 25 MG tablet Take 25 mg by mouth  daily.      . Carboxymethylcellul-Glycerin (REFRESH OPTIVE OP) Apply 1-2 drops to eye.       . carboxymethylcellulose (REFRESH) 1 % ophthalmic solution Apply 1-2 drops to eye daily.       . Cholecalciferol (VITAMIN D) 2000 UNITS CAPS Take 1 capsule by mouth daily.      . colestipol (COLESTID) 1 G tablet Take 1 tablet (1 g total) by mouth See admin instructions. Take 1-2 tabs, two to three times per day  Brand name only  540 tablet  0  . HYDROmorphone (DILAUDID) 2 MG tablet Take 2 mg by mouth every 8 (eight) hours as needed.       Marland Kitchen LACTASE PO Take 25 mg by mouth. 2 tablets by mouth three times daily      . levothyroxine (SYNTHROID, LEVOTHROID) 200 MCG tablet Take 200 mcg by mouth daily before breakfast. Take 1 &1/2 tablets po on Monday,wed, fri      . levothyroxine (SYNTHROID, LEVOTHROID) 200 MCG tablet Take 200  mcg by mouth. Take 2 tablets po tue,thurs,saturday,sunday      . loperamide (IMODIUM) 2 MG capsule Take by mouth as needed for diarrhea or loose stools.      Marland Kitchen LORazepam (ATIVAN) 0.5 MG tablet Take 1 mg by mouth 2 (two) times daily.       [place] (OYSTER CALCIUM) 500 MG TABS tablet Take 500 mg of elemental calcium by mouth daily.       . predniSONE (DELTASONE) 5 MG tablet Take 5 mg by mouth daily.      . primidone (MYSOLINE) 250 MG tablet Take 125 mg by mouth daily.      . primidone (MYSOLINE) 50 MG tablet Take by mouth daily.      . sertraline (ZOLOFT) 100 MG tablet Take 100 mg by mouth daily.       . vitamin C (ASCORBIC ACID) 500 MG tablet Take 500 mg by mouth daily.       No current facility-administered medications for this visit.    Family History  Problem Relation Age of Onset  . Cancer Father   . Heart failure Sister   . Tremor Maternal Grandmother   . Tremor Maternal Grandfather   . Tremor Paternal Grandmother   . Tremor Paternal Grandfather     ROS:  Pertinent items are noted in HPI.  Otherwise, a comprehensive ROS was negative.  Exam:   BP 110/64  Pulse 68  Resp 16  Wt 120 lb (54.432 kg)  LMP 06/08/1986    Ht Readings from Last 3 Encounters:  11/05/12 5' 5.5" (1.664 m)  05/13/12 5\' 6"  (1.676 m)  07/25/12 5' 5.5" (1.664 m)    General appearance: alert, cooperative and appears stated age Head: Normocephalic, without obvious abnormality, atraumatic Neck: no adenopathy, supple, symmetrical, trachea midline and thyroid normal to inspection and palpation and non-palpable Lungs: clear to auscultation bilaterally Breasts: No axillary or supraclavicular adenopathy, chest wall palpated with no unusual findings on either side of where breast were removed. Heart: regular rate and rhythm Abdomen: soft, non-tender; no masses,  no organomegaly Extremities: extremities normal, atraumatic, no cyanosis or edema Skin: Skin color, texture, turgor normal. No rashes or  lesions Lymph nodes: Cervical, supraclavicular, and axillary nodes normal. No abnormal inguinal nodes palpated Neurologic: Grossly normal   Pelvic: External genitalia:  no lesions              Urethra:  normal appearing urethra with no masses, tenderness or lesions  Bartholin's and Skene's: normal                 Vagina: normal appearing vagina with normal color and discharge, no lesions              Cervix: normal appearance, non tender              Pap taken: no Bimanual Exam:  Uterus:  normal size, contour, position, consistency, mobility, non-tender and mid positon              Adnexa: normal adnexa and no mass, fullness, tenderness               Rectovaginal: Confirms               Anus:  normal sphincter tone, no lesions  A:  Well Woman with normal exam  Menopausal no HRT  Recent stroke, in recovery now with cardiology management  Hypothyroid with stable mediation with PCP management.  History of right and left breast cancer and reoccurrence in right. Colon cancer. No oncology follow up now  P:   Reviewed health and wellness pertinent to exam with patient, who voiced understanding  "Aware if she sees blood to notify us."  Continue follow up as indicated for her recovery  Continue SBE in chest area and advise if change  Pap smear as per guidelines   pap smear not taken today  counseled on breast self exam, adequate intake of calcium and vitamin D, diet and exercise as indicated for her recovery Wished patient continued progress with her recovery  return annually or prn  An After Visit Summary was printed and given to the patient.

## 2013-07-31 ENCOUNTER — Other Ambulatory Visit: Payer: Self-pay | Admitting: Neurology

## 2013-07-31 NOTE — Progress Notes (Signed)
Reviewed personally.  M. Suzanne Eagan Shifflett, MD.  

## 2013-08-17 ENCOUNTER — Emergency Department (HOSPITAL_COMMUNITY): Payer: Medicare Other

## 2013-08-17 ENCOUNTER — Observation Stay (HOSPITAL_COMMUNITY)
Admission: EM | Admit: 2013-08-17 | Discharge: 2013-08-19 | Disposition: A | Discharge status: Nursing Home (Includes ICF) | Payer: Medicare Other | Attending: Internal Medicine | Admitting: Internal Medicine

## 2013-08-17 ENCOUNTER — Encounter (HOSPITAL_COMMUNITY): Payer: Self-pay | Admitting: Emergency Medicine

## 2013-08-17 DIAGNOSIS — K219 Gastro-esophageal reflux disease without esophagitis: Secondary | ICD-10-CM | POA: Insufficient documentation

## 2013-08-17 DIAGNOSIS — F32A Depression, unspecified: Secondary | ICD-10-CM

## 2013-08-17 DIAGNOSIS — IMO0002 Reserved for concepts with insufficient information to code with codable children: Secondary | ICD-10-CM | POA: Insufficient documentation

## 2013-08-17 DIAGNOSIS — Z853 Personal history of malignant neoplasm of breast: Secondary | ICD-10-CM

## 2013-08-17 DIAGNOSIS — C189 Malignant neoplasm of colon, unspecified: Secondary | ICD-10-CM

## 2013-08-17 DIAGNOSIS — S0003XA Contusion of scalp, initial encounter: Secondary | ICD-10-CM | POA: Insufficient documentation

## 2013-08-17 DIAGNOSIS — N952 Postmenopausal atrophic vaginitis: Secondary | ICD-10-CM | POA: Insufficient documentation

## 2013-08-17 DIAGNOSIS — Z86711 Personal history of pulmonary embolism: Secondary | ICD-10-CM | POA: Insufficient documentation

## 2013-08-17 DIAGNOSIS — F329 Major depressive disorder, single episode, unspecified: Secondary | ICD-10-CM | POA: Insufficient documentation

## 2013-08-17 DIAGNOSIS — G252 Other specified forms of tremor: Secondary | ICD-10-CM

## 2013-08-17 DIAGNOSIS — Z5181 Encounter for therapeutic drug level monitoring: Secondary | ICD-10-CM

## 2013-08-17 DIAGNOSIS — S32509A Unspecified fracture of unspecified pubis, initial encounter for closed fracture: Secondary | ICD-10-CM | POA: Insufficient documentation

## 2013-08-17 DIAGNOSIS — F3289 Other specified depressive episodes: Secondary | ICD-10-CM | POA: Insufficient documentation

## 2013-08-17 DIAGNOSIS — E039 Hypothyroidism, unspecified: Secondary | ICD-10-CM | POA: Insufficient documentation

## 2013-08-17 DIAGNOSIS — Z9010 Acquired absence of unspecified breast and nipple: Secondary | ICD-10-CM | POA: Insufficient documentation

## 2013-08-17 DIAGNOSIS — M25552 Pain in left hip: Secondary | ICD-10-CM

## 2013-08-17 DIAGNOSIS — Z96649 Presence of unspecified artificial hip joint: Secondary | ICD-10-CM | POA: Insufficient documentation

## 2013-08-17 DIAGNOSIS — S0083XA Contusion of other part of head, initial encounter: Secondary | ICD-10-CM

## 2013-08-17 DIAGNOSIS — I619 Nontraumatic intracerebral hemorrhage, unspecified: Secondary | ICD-10-CM

## 2013-08-17 DIAGNOSIS — I69959 Hemiplegia and hemiparesis following unspecified cerebrovascular disease affecting unspecified side: Secondary | ICD-10-CM | POA: Insufficient documentation

## 2013-08-17 DIAGNOSIS — Z79899 Other long term (current) drug therapy: Secondary | ICD-10-CM | POA: Insufficient documentation

## 2013-08-17 DIAGNOSIS — W19XXXA Unspecified fall, initial encounter: Secondary | ICD-10-CM | POA: Diagnosis present

## 2013-08-17 DIAGNOSIS — Z7982 Long term (current) use of aspirin: Secondary | ICD-10-CM | POA: Insufficient documentation

## 2013-08-17 DIAGNOSIS — Z87891 Personal history of nicotine dependence: Secondary | ICD-10-CM | POA: Insufficient documentation

## 2013-08-17 DIAGNOSIS — Z85038 Personal history of other malignant neoplasm of large intestine: Secondary | ICD-10-CM | POA: Insufficient documentation

## 2013-08-17 DIAGNOSIS — Y9210 Unspecified residential institution as the place of occurrence of the external cause: Secondary | ICD-10-CM | POA: Insufficient documentation

## 2013-08-17 DIAGNOSIS — R200 Anesthesia of skin: Secondary | ICD-10-CM

## 2013-08-17 DIAGNOSIS — S0093XA Contusion of unspecified part of head, initial encounter: Secondary | ICD-10-CM

## 2013-08-17 DIAGNOSIS — S329XXA Fracture of unspecified parts of lumbosacral spine and pelvis, initial encounter for closed fracture: Secondary | ICD-10-CM

## 2013-08-17 DIAGNOSIS — E042 Nontoxic multinodular goiter: Secondary | ICD-10-CM | POA: Insufficient documentation

## 2013-08-17 DIAGNOSIS — S32609A Unspecified fracture of unspecified ischium, initial encounter for closed fracture: Principal | ICD-10-CM | POA: Insufficient documentation

## 2013-08-17 DIAGNOSIS — G250 Essential tremor: Secondary | ICD-10-CM | POA: Insufficient documentation

## 2013-08-17 DIAGNOSIS — M069 Rheumatoid arthritis, unspecified: Secondary | ICD-10-CM | POA: Insufficient documentation

## 2013-08-17 DIAGNOSIS — E559 Vitamin D deficiency, unspecified: Secondary | ICD-10-CM

## 2013-08-17 DIAGNOSIS — C50919 Malignant neoplasm of unspecified site of unspecified female breast: Secondary | ICD-10-CM

## 2013-08-17 DIAGNOSIS — M810 Age-related osteoporosis without current pathological fracture: Secondary | ICD-10-CM | POA: Insufficient documentation

## 2013-08-17 DIAGNOSIS — R269 Unspecified abnormalities of gait and mobility: Secondary | ICD-10-CM

## 2013-08-17 DIAGNOSIS — I10 Essential (primary) hypertension: Secondary | ICD-10-CM | POA: Insufficient documentation

## 2013-08-17 DIAGNOSIS — S1093XA Contusion of unspecified part of neck, initial encounter: Secondary | ICD-10-CM

## 2013-08-17 LAB — URINALYSIS, ROUTINE W REFLEX MICROSCOPIC
Bilirubin Urine: NEGATIVE
Glucose, UA: NEGATIVE mg/dL
Hgb urine dipstick: NEGATIVE
Ketones, ur: NEGATIVE mg/dL
LEUKOCYTES UA: NEGATIVE
Nitrite: NEGATIVE
PROTEIN: NEGATIVE mg/dL
Specific Gravity, Urine: 1.014 (ref 1.005–1.030)
UROBILINOGEN UA: 0.2 mg/dL (ref 0.0–1.0)
pH: 7.5 (ref 5.0–8.0)

## 2013-08-17 LAB — CBC WITH DIFFERENTIAL/PLATELET
BASOS PCT: 0 % (ref 0–1)
Basophils Absolute: 0 10*3/uL (ref 0.0–0.1)
EOS ABS: 0.1 10*3/uL (ref 0.0–0.7)
Eosinophils Relative: 1 % (ref 0–5)
HCT: 39.8 % (ref 36.0–46.0)
HEMOGLOBIN: 13.4 g/dL (ref 12.0–15.0)
Lymphocytes Relative: 11 % — ABNORMAL LOW (ref 12–46)
Lymphs Abs: 0.7 10*3/uL (ref 0.7–4.0)
MCH: 33 pg (ref 26.0–34.0)
MCHC: 33.7 g/dL (ref 30.0–36.0)
MCV: 98 fL (ref 78.0–100.0)
Monocytes Absolute: 0.5 10*3/uL (ref 0.1–1.0)
Monocytes Relative: 8 % (ref 3–12)
NEUTROS PCT: 80 % — AB (ref 43–77)
Neutro Abs: 4.8 10*3/uL (ref 1.7–7.7)
Platelets: 104 10*3/uL — ABNORMAL LOW (ref 150–400)
RBC: 4.06 MIL/uL (ref 3.87–5.11)
RDW: 13.4 % (ref 11.5–15.5)
WBC: 6 10*3/uL (ref 4.0–10.5)

## 2013-08-17 LAB — PROTIME-INR
INR: 1.13 (ref 0.00–1.49)
Prothrombin Time: 14.3 seconds (ref 11.6–15.2)

## 2013-08-17 LAB — BASIC METABOLIC PANEL
BUN: 18 mg/dL (ref 6–23)
CO2: 27 mEq/L (ref 19–32)
Calcium: 8.8 mg/dL (ref 8.4–10.5)
Chloride: 98 mEq/L (ref 96–112)
Creatinine, Ser: 0.62 mg/dL (ref 0.50–1.10)
GFR, EST NON AFRICAN AMERICAN: 85 mL/min — AB (ref >90)
Glucose, Bld: 85 mg/dL (ref 70–99)
POTASSIUM: 3.7 meq/L (ref 3.7–5.3)
Sodium: 140 mEq/L (ref 137–147)

## 2013-08-17 LAB — APTT: aPTT: 27 seconds (ref 24–37)

## 2013-08-17 LAB — I-STAT TROPONIN, ED: Troponin i, poc: 0.08 ng/mL (ref 0.00–0.08)

## 2013-08-17 MED ORDER — LACTASE 3000 UNITS PO TABS
6000.0000 [IU] | ORAL_TABLET | Freq: Three times a day (TID) | ORAL | Status: DC
  Filled 2013-08-17 (×2): qty 2

## 2013-08-17 MED ORDER — MORPHINE SULFATE 2 MG/ML IJ SOLN: 0.5000 mg | INTRAMUSCULAR | Status: DC | PRN

## 2013-08-17 MED ORDER — SODIUM CHLORIDE 0.9 % IV SOLN
INTRAVENOUS | Status: AC
  Administered 2013-08-17: 1000 mL via INTRAVENOUS

## 2013-08-17 MED ORDER — ATENOLOL 25 MG PO TABS
25.0000 mg | ORAL_TABLET | Freq: Every day | ORAL | Status: DC
  Administered 2013-08-18 – 2013-08-19 (×2): 25 mg via ORAL
  Filled 2013-08-17 (×2): qty 1

## 2013-08-17 MED ORDER — HYDROXYCHLOROQUINE SULFATE 200 MG PO TABS
200.0000 mg | ORAL_TABLET | Freq: Two times a day (BID) | ORAL | Status: DC
  Administered 2013-08-18 – 2013-08-19 (×3): 200 mg via ORAL
  Filled 2013-08-17 (×5): qty 1

## 2013-08-17 MED ORDER — LACTASE 3000 UNITS PO TABS
9000.0000 [IU] | ORAL_TABLET | Freq: Three times a day (TID) | ORAL | Status: DC
  Administered 2013-08-18 – 2013-08-19 (×5): 9000 [IU] via ORAL
  Filled 2013-08-17 (×8): qty 3

## 2013-08-17 MED ORDER — ASPIRIN 81 MG PO TABS: 81.0000 mg | ORAL_TABLET | Freq: Every day | ORAL | Status: DC

## 2013-08-17 MED ORDER — PRIMIDONE 250 MG PO TABS
125.0000 mg | ORAL_TABLET | Freq: Two times a day (BID) | ORAL | Status: DC
  Administered 2013-08-17 – 2013-08-19 (×4): 125 mg via ORAL
  Filled 2013-08-17 (×5): qty 1

## 2013-08-17 MED ORDER — PREDNISONE 5 MG PO TABS
5.0000 mg | ORAL_TABLET | Freq: Every day | ORAL | Status: DC
  Administered 2013-08-18 – 2013-08-19 (×2): 5 mg via ORAL
  Filled 2013-08-17 (×3): qty 1

## 2013-08-17 MED ORDER — LEVOTHYROXINE SODIUM 200 MCG PO TABS
400.0000 ug | ORAL_TABLET | Freq: Every day | ORAL | Status: DC
  Administered 2013-08-18 – 2013-08-19 (×2): 400 ug via ORAL
  Filled 2013-08-17 (×3): qty 2

## 2013-08-17 MED ORDER — COLESTIPOL HCL 1 G PO TABS
2.0000 g | ORAL_TABLET | Freq: Three times a day (TID) | ORAL | Status: DC
  Administered 2013-08-18 – 2013-08-19 (×5): 2 g via ORAL
  Filled 2013-08-17 (×7): qty 2

## 2013-08-17 MED ORDER — HYDROMORPHONE HCL PF 1 MG/ML IJ SOLN: 0.5000 mg | Freq: Three times a day (TID) | INTRAMUSCULAR | Status: DC | PRN

## 2013-08-17 MED ORDER — LACTASE 3000 UNITS PO TABS
6000.0000 [IU] | ORAL_TABLET | Freq: Three times a day (TID) | ORAL | Status: DC
  Filled 2013-08-17: qty 2

## 2013-08-17 MED ORDER — HYDROMORPHONE HCL PF 1 MG/ML IJ SOLN
0.5000 mg | Freq: Once | INTRAMUSCULAR | Status: AC
  Administered 2013-08-17: 0.5 mg via INTRAVENOUS
  Filled 2013-08-17: qty 1

## 2013-08-17 MED ORDER — HYDROCODONE-ACETAMINOPHEN 5-325 MG PO TABS: 1.0000 | ORAL_TABLET | Freq: Four times a day (QID) | ORAL | Status: DC | PRN

## 2013-08-17 MED ORDER — SERTRALINE HCL 100 MG PO TABS
100.0000 mg | ORAL_TABLET | Freq: Every day | ORAL | Status: DC
  Administered 2013-08-18 – 2013-08-19 (×2): 100 mg via ORAL
  Filled 2013-08-17 (×2): qty 1

## 2013-08-17 MED ORDER — HEPARIN SODIUM (PORCINE) 5000 UNIT/ML IJ SOLN
5000.0000 [IU] | Freq: Three times a day (TID) | INTRAMUSCULAR | Status: DC
  Administered 2013-08-17 – 2013-08-19 (×5): 5000 [IU] via SUBCUTANEOUS
  Filled 2013-08-17 (×8): qty 1

## 2013-08-17 MED ORDER — PRIMIDONE 50 MG PO TABS
50.0000 mg | ORAL_TABLET | Freq: Every day | ORAL | Status: DC
  Administered 2013-08-17 – 2013-08-18 (×2): 50 mg via ORAL
  Filled 2013-08-17 (×3): qty 1

## 2013-08-17 MED ORDER — ASPIRIN 81 MG PO CHEW
81.0000 mg | CHEWABLE_TABLET | Freq: Every day | ORAL | Status: DC
  Administered 2013-08-18 – 2013-08-19 (×2): 81 mg via ORAL
  Filled 2013-08-17 (×2): qty 1

## 2013-08-17 MED ORDER — HYDROMORPHONE HCL 2 MG PO TABS
2.0000 mg | ORAL_TABLET | Freq: Four times a day (QID) | ORAL | Status: DC | PRN
  Administered 2013-08-17 – 2013-08-19 (×6): 2 mg via ORAL
  Filled 2013-08-17 (×6): qty 1

## 2013-08-17 MED ORDER — LORAZEPAM 1 MG PO TABS
1.0000 mg | ORAL_TABLET | Freq: Two times a day (BID) | ORAL | Status: DC | PRN
  Administered 2013-08-19: 1 mg via ORAL
  Filled 2013-08-17: qty 1

## 2013-08-17 NOTE — ED Provider Notes (Signed)
CSN: [id number]     Arrival date & time 08/17/13  1241 History   First MD Initiated Contact with Patient 08/17/13 1308     Chief Complaint  Patient presents with  . Fall     (Consider location/radiation/quality/duration/timing/severity/associated sxs/prior Treatment) HPI Patient presents to the emergency Department with her son. They report yesterday she fell in her apartment when she lost her balance while using her walker. She fell back and hit her head and also hit her left hip. Although she denies headache now her son said she was complaining of headache, neck pain, left knee pain and left hip pain earlier today. Since she fell she's been unable to use her walker and has to use a wheelchair. Son also states she's been complaining of left-sided numbness and weakness for the past several days. She states her pain in her hip gets much worse when she tries to move the leg. It is not clear how her prior stroke affected her.   PCP Dr Earlie Counts  Past Medical History  Diagnosis Date  . PE (pulmonary embolism)   . Subarachnoid hemorrhage   . Spastic hemiplegia affecting nondominant side   . Intracerebral hemorrhage   . Hypertension   . Arthritis   . Stroke 3/12  . Gastrointestinal problem   . Depression   . Breast CA     mastectomy  . Colon cancer   . Tremor     benign essential  . Rheumatoid arthritis(714.0)   . Hypothyroidism   . Dysrhythmia     palpitation  . Lymphedema of arm     right  . Atrophic vaginitis   . Osteoporosis   . Multinodular goiter   . Menieres disease 2002  . Thrombocytopenia 2002  . Gastroparesis 7/03  . Colon cancer 2007  . Pulmonary embolism 2003    chronic coumadin  . CVA (cerebral vascular accident)    Past Surgical History  Procedure Laterality Date  . Fracture surgery  2010    rt wrist  . Mastectomy      bilat mastectomies  . Breast surgery    . Joint replacement      Bilateral total hip replacement  . Hardware removal  03/29/2012     Procedure: HARDWARE REMOVAL;  Surgeon: Alta Corning, MD;  Location: [facility];  Service: Orthopedics;  Laterality: Left;  Screw Removal Left Foot  . Shoulder arthroscopy Right   . Thyroidectomy    . Total hip arthroplasty    . Cholecystectomy    . Tonsillectomy    . Left ankle      ruptured achilles tendon  . Carpal tunnel release Bilateral   . Thoracic outlet surgery Bilateral   . Ulnar nerve transposition Right   . Knee surgery Bilateral   . Lumbar laminectomy    . Nasal sinus surgery    . Cervical laminectomy    . Bunionectomy Right   . Right wrist    . Squamous cell carcinoma resec     Family History  Problem Relation Age of Onset  . Cancer Father   . Heart failure Sister   . Tremor Maternal Grandmother   . Tremor Maternal Grandfather   . Tremor Paternal Grandmother   . Tremor Paternal Grandfather    History  Substance Use Topics  . Smoking status: Former Smoker    Quit date: 02/25/1970  . Smokeless tobacco: Never Used  . Alcohol Use: 0.0 oz/week     Comment: occ wine   [place]ives  in [place] Uses walker/wheelchair  OB History   Grav Para Term Preterm Abortions TAB SAB Ect Mult Living   4 4   0     4     Review of Systems  All other systems reviewed and are negative.     Allergies  Demerol; Fentanyl; Metoclopramide hcl; Morphine and related; Nsaids; Pentazocine; Pentazocine lactate; and Versed  Home Medications   Current Outpatient Rx  Name  Route  Sig  Dispense  Refill  . aspirin 81 MG tablet   Oral   Take 81 mg by mouth daily.         Marland Kitchen atenolol (TENORMIN) 25 MG tablet   Oral   Take 25 mg by mouth daily.         . calcium carbonate (TUMS - DOSED IN MG ELEMENTAL CALCIUM) 500 MG chewable tablet   Oral   Chew 1 tablet by mouth 4 (four) times daily.         . carboxymethylcellulose (REFRESH) 1 % ophthalmic solution   Ophthalmic   Apply 1-2 drops to eye at bedtime.          . Cholecalciferol (VITAMIN D) 2000 UNITS CAPS   Oral    Take 1 capsule by mouth daily.         . colestipol (COLESTID) 1 G tablet   Oral   Take 2 g by mouth 3 (three) times daily with meals.         . hydroxychloroquine (PLAQUENIL) 200 MG tablet   Oral   Take 200 mg by mouth 2 (two) times daily with a meal.         . LACTASE PO   Oral   Take 2 tablets by mouth 3 (three) times daily with meals.          Marland Kitchen levothyroxine (SYNTHROID, LEVOTHROID) 200 MCG tablet   Oral   Take 400 mcg by mouth daily before breakfast.          . LORazepam (ATIVAN) 0.5 MG tablet   Oral   Take 1 mg by mouth 2 (two) times daily.          Loma [place] (OYSTER CALCIUM) 500 MG TABS tablet   Oral   Take 500 mg of elemental calcium by mouth daily.          . predniSONE (DELTASONE) 5 MG tablet   Oral   Take 5 mg by mouth daily with breakfast.          . primidone (MYSOLINE) 250 MG tablet   Oral   Take 125 mg by mouth 2 (two) times daily.          . primidone (MYSOLINE) 50 MG tablet   Oral   Take 1 tablet (50 mg total) by mouth at bedtime.   30 tablet   6   . sertraline (ZOLOFT) 100 MG tablet   Oral   Take 100 mg by mouth daily.          . vitamin C (ASCORBIC ACID) 500 MG tablet   Oral   Take 500 mg by mouth daily.         . Carboxymethylcellul-Glycerin (REFRESH OPTIVE OP)   Ophthalmic   Apply 1-2 drops to eye.          Marland Kitchen HYDROmorphone (DILAUDID) 2 MG tablet   Oral   Take 2 mg by mouth every 8 (eight) hours as needed.          . loperamide (  IMODIUM) 2 MG capsule   Oral   Take by mouth as needed for diarrhea or loose stools.          BP 120/61  Pulse 59  Temp(Src) 98 F (36.7 C) (Oral)  Resp 16  Ht 5\' 5"  (1.651 m)  Wt 120 lb (54.432 kg)  BMI 19.97 kg/m2  SpO2 99%  LMP 06/08/1986  Vital signs normal   Physical Exam  Nursing note and vitals reviewed. Constitutional: She is oriented to person, place, and time. She appears well-developed and well-nourished.  Non-toxic appearance. She does not appear ill.  No distress.  HENT:  Head: Normocephalic and atraumatic.  Right Ear: External ear normal.  Left Ear: External ear normal.  Nose: Nose normal. No mucosal edema or rhinorrhea.  Mouth/Throat: Oropharynx is clear and moist and mucous membranes are normal. No dental abscesses or uvula swelling.  Eyes: Conjunctivae and EOM are normal. Pupils are equal, round, and reactive to light.  Neck: Normal range of motion and full passive range of motion without pain. Neck supple.  Cardiovascular: Normal rate, regular rhythm and normal heart sounds.  Exam reveals no gallop and no friction rub.   No murmur heard. Pulmonary/Chest: Effort normal and breath sounds normal. No respiratory distress. She has no wheezes. She has no rhonchi. She has no rales. She exhibits no tenderness and no crepitus.  Abdominal: Soft. Normal appearance and bowel sounds are normal. She exhibits no distension. There is no tenderness. There is no rebound and no guarding.  Musculoskeletal: Normal range of motion. She exhibits no edema and no tenderness.  Patient is tender in the left hip area when she tries to move her leg. Although she complains of knee pain there is no obvious swelling or deformity to her knee. There is some internal rotation but no shortening of the extremity.  Neurological: She is alert and oriented to person, place, and time. She has normal strength. No cranial nerve deficit.  Skin: Skin is warm, dry and intact. No rash noted. No erythema. No pallor.  Psychiatric: Her speech is normal and behavior is normal. Her mood appears not anxious.  Flat affect    ED Course  Procedures (including critical care time)  Pt did not want pain medication after initial interview.   Have discussed her xray results. She has a hx of coccyx fracture but not pelvic fracture. Agreeable to MR of hip and will also get brain with her recent numbness in her LUE>   16:00 turned over to Dr Wyvonnia Dusky at change of shift to get MR results.    Labs Review Results for orders placed during the hospital encounter of 08/17/13  CBC WITH DIFFERENTIAL      Result Value Ref Range   WBC 6.0  4.0 - 10.5 K/uL   RBC 4.06  3.87 - 5.11 MIL/uL   Hemoglobin 13.4  12.0 - 15.0 g/dL   HCT 39.8  36.0 - 46.0 %   MCV 98.0  78.0 - 100.0 fL   MCH 33.0  26.0 - 34.0 pg   MCHC 33.7  30.0 - 36.0 g/dL   RDW 13.4  11.5 - 15.5 %   Platelets 104 (*) 150 - 400 K/uL   Neutrophils Relative % 80 (*) 43 - 77 %   Neutro Abs 4.8  1.7 - 7.7 K/uL   Lymphocytes Relative 11 (*) 12 - 46 %   Lymphs Abs 0.7  0.7 - 4.0 K/uL   Monocytes Relative 8  3 - 12 %  Monocytes Absolute 0.5  0.1 - 1.0 K/uL   Eosinophils Relative 1  0 - 5 %   Eosinophils Absolute 0.1  0.0 - 0.7 K/uL   Basophils Relative 0  0 - 1 %   Basophils Absolute 0.0  0.0 - 0.1 K/uL  BASIC METABOLIC PANEL      Result Value Ref Range   Sodium 140  137 - 147 mEq/L   Potassium 3.7  3.7 - 5.3 mEq/L   Chloride 98  96 - 112 mEq/L   CO2 27  19 - 32 mEq/L   Glucose, Bld 85  70 - 99 mg/dL   BUN 18  6 - 23 mg/dL   Creatinine, Ser 0.62  0.50 - 1.10 mg/dL   Calcium 8.8  8.4 - 10.5 mg/dL   GFR calc non Af Amer 85 (*) >90 mL/min   GFR calc Af Amer >90  >90 mL/min  APTT      Result Value Ref Range   aPTT 27  24 - 37 seconds  PROTIME-INR      Result Value Ref Range   Prothrombin Time 14.3  11.6 - 15.2 seconds   INR 1.13  0.00 - 1.49  I-STAT TROPOININ, ED      Result Value Ref Range   Troponin i, poc 0.08  0.00 - 0.08 ng/mL   Comment 3            Laboratory interpretation all normal     Imaging Review Dg Hip Complete Left  08/17/2013   CLINICAL DATA:  Fall, left hip pain  EXAM: LEFT HIP - COMPLETE 2+ VIEW  COMPARISON:  DG ABD 2 VIEWS dated 07/27/2010; CT PELVIS W/CM dated 05/28/2008  FINDINGS: Bilateral total hip arthroplasties are reidentified. Left femoral prosthetic component is properly located. Cortical deformity of the left superior and inferior pubic rami is noted. This area was incompletely  imaged on the most recent dissimilar prior exam 07/27/2010. These findings appear new from the prior exam 05/28/2008. There is a suggestion of minimal callus formation.  IMPRESSION: Left superior and inferior pubic rami deformities which are new since 2010 prior exam but demonstrate minimal callus formation that could indicate these may be subacute. MRI may be helpful for further determination of chronicity of findings if clinically needed.   Electronically Signed   By: Conchita Paris M.D.   On: 08/17/2013 15:08   Ct Head Wo Contrast Ct Cervical Spine Wo Contrast  08/17/2013   CLINICAL DATA:  Status post fall 2 days ago. History of previous intracranial hemorrhage.  EXAM: CT HEAD WITHOUT CONTRAST  CT CERVICAL SPINE WITHOUT CONTRAST  TECHNIQUE: Multidetector CT imaging of the head and cervical spine was performed following the standard protocol without intravenous contrast. Multiplanar CT image reconstructions of the cervical spine were also generated.  COMPARISON:  CT HEAD W/O CM dated 07/28/2010  FINDINGS: CT HEAD FINDINGS  There is mild diffuse cerebral and cerebellar atrophy with compensatory ventriculomegaly. There is encephalomalacia in the right frontal lobe. There is decreased density in the deep white matter of both cerebral hemispheres consistent with chronic small vessel ischemic type change. The cerebellum and brainstem exhibit normal density. There are no abnormal intracranial calcifications.  At bone window settings the observed portions of the paranasal sinuses are clear. There is a small amount of fluid within the left mastoid air cells. There is no evidence of an acute skull fracture.  CT CERVICAL SPINE FINDINGS  The cervical vertebral bodies are preserved in height. 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 There is mild degenerative change at C4-5 and C7-T1. There is no evidence of a perched facet. There is mild facet joint hypertrophy at multiple levels. The odontoid is  intact. The lateral masses of C1 align normally with those of C2. There is degenerative change of the atlanto-dens articulation and of the articulation of the lateral masses of C1 with C2. The bony ring at each cervical level is intact. There is no acute fracture of the observed portions of the first and second ribs. Old deformities of the first ribs. The pulmonary apices exhibit minimal fibrotic change. The soft tissues of the neck exhibit no acute abnormalities. There is dense calcification in the carotid arterial system.  IMPRESSION: 1. There are chronic changes within the brain as described consistent with previous ischemic insult in the right frontal lobe as well as chronic small vessel ischemic change diffusely. There is no acute intracranial hemorrhage. 2. There is no evidence of an acute skull fracture. 3. There is no evidence of an acute cervical spine fracture nor dislocation. 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   Electronically Signed   By: David  Martinique   On: 08/17/2013 15:49     EKG Interpretation   Date/Time:  Sunday August 17 2013 13:24:12 EDT Ventricular Rate:  60 PR Interval:  156 QRS Duration: 106 QT Interval:  458 QTC Calculation: 458 R Axis:   -39 Text Interpretation:  Sinus rhythm Probable left atrial enlargement LVH  with secondary repolarization abnormality Electrode noise No significant  change since last tracing Confirmed by Samoria Fedorko  MD-I, Talin Rozeboom ([id number]) on  08/17/2013 4:01:52 PM      MDM   Final diagnoses:  Fall  Hip pain, left  Numbness on left side  Contusion of head    Disposition pending  Rolland Porter, MD, Abram Sander     Janice Norrie, MD 08/17/13 [phone]

## 2013-08-17 NOTE — H&P (Signed)
[facility] History and Physical  Patient: [name]  [id number]  DOB: 16-Jun-1933  DOS: the patient was seen and examined on 08/17/2013 PCP: MAZZOCCHI, Reggy Eye, MD  Chief Complaint: Fall and left hip pain  HPI: [name] is a 80 y.o. female with Past medical history of hypertension, CVA, subarachnoid hemorrhage, GERD, colon and breast cancer, rheumatoid arthritis, hypothyroidism, essential tremors. The patient presented with complain of a mechanical fall. She described her fall as she was walking down the hallway at which time her tremors were getting worse and her walker was not staying steady and she lost her walker and then her balance and then fell on the ground. After the fall continues to have complaints of left sided hip pain with difficulty moving around. Therefore she was brought to the hospital. December 14 she was asked to reduce her dose of primidone from 250-125 twice a day to 125 twice a day. Since her symptoms are progressively worsening family requested to increase her dose of primidone and patient was placed on 50 mg each bedtime extra dose other than 125 every 12 hours needed Other than this no other recent change in her medications. No prior falls no trauma no injury for these falls. Patient has been complaining of left-sided weakness and left-sided numbness to her son at present she denies any complaints of left-sided weakness.  The patient is coming from home. And at her baseline ndependent for most of her  ADL.  Review of Systems: as mentioned in the history of present illness.  A Comprehensive review of the other systems is negative.  Past Medical History  Diagnosis Date  . PE (pulmonary embolism)   . Subarachnoid hemorrhage   . Spastic hemiplegia affecting nondominant side   . Intracerebral hemorrhage   . Hypertension   . Arthritis   . Stroke 3/12  . Gastrointestinal problem   . Depression   . Breast CA     mastectomy  . Colon cancer    . Tremor     benign essential  . Rheumatoid arthritis(714.0)   . Hypothyroidism   . Dysrhythmia     palpitation  . Lymphedema of arm     right  . Atrophic vaginitis   . Osteoporosis   . Multinodular goiter   . Menieres disease 2002  . Thrombocytopenia 2002  . Gastroparesis 7/03  . Colon cancer 2007  . Pulmonary embolism 2003    chronic coumadin  . CVA (cerebral vascular accident)    Past Surgical History  Procedure Laterality Date  . Fracture surgery  2010    rt wrist  . Mastectomy      bilat mastectomies  . Breast surgery    . Joint replacement      Bilateral total hip replacement  . Hardware removal  03/29/2012    Procedure: HARDWARE REMOVAL;  Surgeon: Alta Corning, MD;  Location: [place];  Service: Orthopedics;  Laterality: Left;  Screw Removal Left Foot  . Shoulder arthroscopy Right   . Thyroidectomy    . Total hip arthroplasty    . Cholecystectomy    . Tonsillectomy    . Left ankle      ruptured achilles tendon  . Carpal tunnel release Bilateral   . Thoracic outlet surgery Bilateral   . Ulnar nerve transposition Right   . Knee surgery Bilateral   . Lumbar laminectomy    . Nasal sinus surgery    . Cervical laminectomy    . Bunionectomy Right   .  Right wrist    . Squamous cell carcinoma resec     Social History:  reports that she quit smoking about 43 years ago. She has never used smokeless tobacco. She reports that she drinks alcohol. She reports that she does not use illicit drugs.  Allergies  Allergen Reactions  . Demerol   . Fentanyl   . Metoclopramide Hcl   . Morphine And Related   . Nsaids     Upset stomach  . Pentazocine   . Pentazocine Lactate   . Versed [Midazolam]     Family History  Problem Relation Age of Onset  . Cancer Father   . Heart failure Sister   . Tremor Maternal Grandmother   . Tremor Maternal Grandfather   . Tremor Paternal Grandmother   . Tremor Paternal Grandfather     Prior to Admission  medications   Medication Sig Start Date End Date Taking? Authorizing Provider  aspirin 81 MG tablet Take 81 mg by mouth daily.   Yes Historical Provider, MD  atenolol (TENORMIN) 25 MG tablet Take 25 mg by mouth daily.   Yes Historical Provider, MD  calcium carbonate (TUMS - DOSED IN MG ELEMENTAL CALCIUM) 500 MG chewable tablet Chew 1 tablet by mouth 4 (four) times daily.   Yes Historical Provider, MD  carboxymethylcellulose (REFRESH) 1 % ophthalmic solution Apply 1-2 drops to eye at bedtime.    Yes Historical Provider, MD  Cholecalciferol (VITAMIN D) 2000 UNITS CAPS Take 1 capsule by mouth daily.   Yes Historical Provider, MD  colestipol (COLESTID) 1 G tablet Take 2 g by mouth 3 (three) times daily with meals.   Yes Historical Provider, MD  hydroxychloroquine (PLAQUENIL) 200 MG tablet Take 200 mg by mouth 2 (two) times daily with a meal.   Yes Historical Provider, MD  LACTASE PO Take 2 tablets by mouth 3 (three) times daily with meals.    Yes Historical Provider, MD  levothyroxine (SYNTHROID, LEVOTHROID) 200 MCG tablet Take 400 mcg by mouth daily before breakfast.    Yes Historical Provider, MD  LORazepam (ATIVAN) 0.5 MG tablet Take 1 mg by mouth 2 (two) times daily.    Yes Historical Provider, MD  L[place] (OYSTER CALCIUM) 500 MG TABS tablet Take 500 mg of elemental calcium by mouth daily.    Yes Historical Provider, MD  predniSONE (DELTASONE) 5 MG tablet Take 5 mg by mouth daily with breakfast.  03/25/13  Yes Historical Provider, MD  primidone (MYSOLINE) 250 MG tablet Take 125 mg by mouth 2 (two) times daily.  06/19/13  Yes Kathrynn Ducking, MD  primidone (MYSOLINE) 50 MG tablet Take 1 tablet (50 mg total) by mouth at bedtime. 07/31/13  Yes Kathrynn Ducking, MD  sertraline (ZOLOFT) 100 MG tablet Take 100 mg by mouth daily.    Yes Historical Provider, MD  vitamin C (ASCORBIC ACID) 500 MG tablet Take 500 mg by mouth daily.   Yes Historical Provider, MD  Carboxymethylcellul-Glycerin (REFRESH  OPTIVE OP) Apply 1-2 drops to eye.     Historical Provider, MD  HYDROmorphone (DILAUDID) 2 MG tablet Take 2 mg by mouth every 8 (eight) hours as needed.     Historical Provider, MD  loperamide (IMODIUM) 2 MG capsule Take by mouth as needed for diarrhea or loose stools.    Historical Provider, MD    Physical Exam: Filed Vitals:   08/17/13 1700 08/17/13 1730 08/17/13 2030 08/17/13 2100  BP: 129/62 147/84 167/56 130/72  Pulse: 63 66 72 75  Temp:      TempSrc:      Resp: 16 18    Height:      Weight:      SpO2: 99% 99% 93% 92%    General: Alert, Awake and Oriented to Time, Place and Person. Appear in mild distress Eyes: PERRL ENT: Oral Mucosa clear moist. Neck: no JVD Cardiovascular: S1 and S2 Present, aortic systolic Murmur, Peripheral Pulses Present Respiratory: Bilateral Air entry equal and Decreased, Clear to Auscultation,  no Crackles,no wheezes Abdomen: Bowel Sound Present, Soft and Non tender Skin: no Rash Extremities: no Pedal edema, no calf tenderness, left leg rotated internally localized tenderness at left groin. Pulses palpable distally  Neurologic: Left hand weakness agaist gravity. Which has been ongoing since last couple of month as per son, left leg unable to move secondary to pain. Movement on the ankle and toes equal in both side. Sensation is intact Bilaterally Difficulty with finger-nose-finger on the left side due to inability to left hand.  Labs on Admission:  CBC:  Recent Labs Lab 08/17/13 1330  WBC 6.0  NEUTROABS 4.8  HGB 13.4  HCT 39.8  MCV 98.0  PLT 104*    CMP     Component Value Date/Time   NA 140 08/17/2013 1330   NA 141 10/29/2012 1414   NA 139 10/15/2012 1308   K 3.7 08/17/2013 1330   K 3.7 10/29/2012 1414   CL 98 08/17/2013 1330   CL 103 10/29/2012 1414   CO2 27 08/17/2013 1330   CO2 29 10/29/2012 1414   GLUCOSE 85 08/17/2013 1330   GLUCOSE 142* 10/29/2012 1414   GLUCOSE 129* 10/15/2012 1308   BUN 18 08/17/2013 1330   BUN 10.4 10/29/2012  1414   BUN 11 10/15/2012 1308   CREATININE 0.62 08/17/2013 1330   CREATININE 0.8 10/29/2012 1414   CALCIUM 8.8 08/17/2013 1330   CALCIUM 8.9 10/29/2012 1414   PROT 6.1* 10/29/2012 1414   PROT 6.2 10/15/2012 1308   PROT 5.5* 10/25/2011 1401   ALBUMIN 3.5 10/29/2012 1414   ALBUMIN 3.7 10/25/2011 1401   AST 21 10/29/2012 1414   AST 22 10/15/2012 1308   ALT 12 10/29/2012 1414   ALT 17 10/15/2012 1308   ALKPHOS 70 10/29/2012 1414   ALKPHOS 63 10/15/2012 1308   BILITOT 0.38 10/29/2012 1414   BILITOT 0.3 10/15/2012 1308   GFRNONAA 85* 08/17/2013 1330   GFRAA >90 08/17/2013 1330    No results found for this basename: LIPASE, AMYLASE,  in the last 168 hours No results found for this basename: AMMONIA,  in the last 168 hours  No results found for this basename: CKTOTAL, CKMB, CKMBINDEX, TROPONINI,  in the last 168 hours BNP (last 3 results) No results found for this basename: PROBNP,  in the last 8760 hours  Radiological Exams on Admission: Dg Hip Complete Left  08/17/2013   CLINICAL DATA:  Fall, left hip pain  EXAM: LEFT HIP - COMPLETE 2+ VIEW  COMPARISON:  DG ABD 2 VIEWS dated 07/27/2010; CT PELVIS W/CM dated 05/28/2008  FINDINGS: Bilateral total hip arthroplasties are reidentified. Left femoral prosthetic component is properly located. Cortical deformity of the left superior and inferior pubic rami is noted. This area was incompletely imaged on the most recent dissimilar prior exam 07/27/2010. These findings appear new from the prior exam 05/28/2008. There is a suggestion of minimal callus formation.  IMPRESSION: Left superior and inferior pubic rami deformities which are new since 2010 prior exam but demonstrate minimal callus  formation that could indicate these may be subacute. MRI may be helpful for further determination of chronicity of findings if clinically needed.   Electronically Signed   By: Conchita Paris M.D.   On: 08/17/2013 15:08   Ct Head Wo Contrast  08/17/2013   CLINICAL DATA:  Status post  fall 2 days ago. History of previous intracranial hemorrhage.  EXAM: CT HEAD WITHOUT CONTRAST  CT CERVICAL SPINE WITHOUT CONTRAST  TECHNIQUE: Multidetector CT imaging of the head and cervical spine was performed following the standard protocol without intravenous contrast. Multiplanar CT image reconstructions of the cervical spine were also generated.  COMPARISON:  CT HEAD W/O CM dated 07/28/2010  FINDINGS: CT HEAD FINDINGS  There is mild diffuse cerebral and cerebellar atrophy with compensatory ventriculomegaly. There is encephalomalacia in the right frontal lobe. There is decreased density in the deep white matter of both cerebral hemispheres consistent with chronic small vessel ischemic type change. The cerebellum and brainstem exhibit normal density. There are no abnormal intracranial calcifications.  At bone window settings the observed portions of the paranasal sinuses are clear. There is a small amount of fluid within the left mastoid air cells. There is no evidence of an acute skull fracture.  CT CERVICAL SPINE FINDINGS  The cervical vertebral bodies are preserved in height. 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 There is mild degenerative change at C4-5 and C7-T1. There is no evidence of a perched facet. There is mild facet joint hypertrophy at multiple levels. The odontoid is intact. The lateral masses of C1 align normally with those of C2. There is degenerative change of the atlanto-dens articulation and of the articulation of the lateral masses of C1 with C2. The bony ring at each cervical level is intact. There is no acute fracture of the observed portions of the first and second ribs. Old deformities of the first ribs. The pulmonary apices exhibit minimal fibrotic change. The soft tissues of the neck exhibit no acute abnormalities. There is dense calcification in the carotid arterial system.  IMPRESSION: 1. There are chronic changes within the brain as described consistent with  previous ischemic insult in the right frontal lobe as well as chronic small vessel ischemic change diffusely. There is no acute intracranial hemorrhage. 2. There is no evidence of an acute skull fracture. 3. There is no evidence of an acute cervical spine fracture nor dislocation. 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   Electronically Signed   By: David  Martinique   On: 08/17/2013 15:49   Ct Cervical Spine Wo Contrast  08/17/2013   CLINICAL DATA:  Status post fall 2 days ago. History of previous intracranial hemorrhage.  EXAM: CT HEAD WITHOUT CONTRAST  CT CERVICAL SPINE WITHOUT CONTRAST  TECHNIQUE: Multidetector CT imaging of the head and cervical spine was performed following the standard protocol without intravenous contrast. Multiplanar CT image reconstructions of the cervical spine were also generated.  COMPARISON:  CT HEAD W/O CM dated 07/28/2010  FINDINGS: CT HEAD FINDINGS  There is mild diffuse cerebral and cerebellar atrophy with compensatory ventriculomegaly. There is encephalomalacia in the right frontal lobe. There is decreased density in the deep white matter of both cerebral hemispheres consistent with chronic small vessel ischemic type change. The cerebellum and brainstem exhibit normal density. There are no abnormal intracranial calcifications.  At bone window settings the observed portions of the paranasal sinuses are  clear. There is a small amount of fluid within the left mastoid air cells. There is no evidence of an acute skull fracture.  CT CERVICAL SPINE FINDINGS  The cervical vertebral bodies are preserved in height. 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 There is mild degenerative change at C4-5 and C7-T1. There is no evidence of a perched facet. There is mild facet joint hypertrophy at multiple levels. The odontoid is intact. The lateral masses of C1 align normally with  those of C2. There is degenerative change of the atlanto-dens articulation and of the articulation of the lateral masses of C1 with C2. The bony ring at each cervical level is intact. There is no acute fracture of the observed portions of the first and second ribs. Old deformities of the first ribs. The pulmonary apices exhibit minimal fibrotic change. The soft tissues of the neck exhibit no acute abnormalities. There is dense calcification in the carotid arterial system.  IMPRESSION: 1. There are chronic changes within the brain as described consistent with previous ischemic insult in the right frontal lobe as well as chronic small vessel ischemic change diffusely. There is no acute intracranial hemorrhage. 2. There is no evidence of an acute skull fracture. 3. There is no evidence of an acute cervical spine fracture nor dislocation. 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   Electronically Signed   By: David  Martinique   On: 08/17/2013 15:49   Mr Brain Wo Contrast  08/17/2013   CLINICAL DATA:  History of breast cancer and stroke. Fall. Numbness or weakness left side. Question new stroke.  EXAM: MRI HEAD WITHOUT CONTRAST  TECHNIQUE: Multiplanar, multiecho pulse sequences of the brain and surrounding structures were obtained without intravenous contrast.  COMPARISON:  08/17/2013 CT.  11/23/2010 MR.  FINDINGS: No acute infarct.  Moderate size remote anterior right frontal lobe infarct with encephalomalacia with associated blood breakdown products.  Scattered blood breakdown products in the temporal lobes bilaterally and less so parietal lobes. This may reflect result of prior hemorrhagic ischemia. Cannot completely exclude trauma contributing to scattered blood breakdown products (such as shear injuries) although felt to be less likely consideration. No evidence of extra-axial blood collection.  Moderate small vessel disease  type changes.  Global atrophy. Ventricular prominence may be related to central atrophy although difficult to completely mild component of hydrocephalus. Ventricles are slightly more prominent than on the prior MR although atrophy has also progressed.  No intracranial mass lesion noted on this unenhanced exam.  Partial opacification mastoid air cells bilaterally. No fracture noted on accompanying CT. No obstructing lesion posterior superior nasopharynx causing eustachian tube dysfunction.  Major intracranial vascular structures are patent.  Transverse ligament hypertrophy. Cervical medullary junction, pituitary region, pineal region and orbital structures unremarkable.  IMPRESSION: No acute infarct.  Moderate size remote anterior right frontal lobe infarct.  Scattered blood breakdown products in the temporal lobes bilaterally and less so parietal lobes may reflect result of prior hemorrhagic ischemia. Cannot completely exclude trauma contributing to scattered blood breakdown products (such as shear injuries) although felt to be less likely consideration. No evidence of extra-axial blood collection.  Moderate small vessel disease type changes.  Global atrophy. Ventricular prominence may be related to central atrophy although difficult to completely mild component of hydrocephalus.  No intracranial mass lesion noted on this unenhanced exam.  Partial opacification mastoid air cells bilaterally   Electronically  Signed   By: Chauncey Cruel M.D.   On: 08/17/2013 19:12   Mr Hip Left Wo Contrast  08/17/2013   CLINICAL DATA:  Left hip pain status post fall. Possible left pelvic fracture on radiographs. History of bilateral total hip arthroplasty.  EXAM: MRI OF THE LEFT HIP WITHOUT CONTRAST  TECHNIQUE: Multiplanar, multisequence MR imaging was performed. No intravenous contrast was administered.  COMPARISON:  DG HIP COMPLETE*L* dated 08/17/2013; CT PELVIS W/CM dated 05/28/2008  FINDINGS: Unfortunately, artifact from the  bilateral total hip arthroplasties partially obscures the pubic rami bilaterally. T1 weighted images confirm the presence of nondisplaced fractures of the left superior and inferior pubic rami. There is some marrow edema in the inferior pubic ramus fracture on the T2 weighted images. The right pubic rami, sacrum, symphysis pubis and sacroiliac joints appear intact. There is no evidence of proximal femur fracture or dislocation.  There is some edema within the left proximal thigh adductor musculature. In addition, there is a edema and a probable focal hematoma inferiorly in the left gluteus maximus muscle. There is generalized subcutaneous edema in the pelvis and proximal thighs.  IMPRESSION: 1. Nondisplaced fractures of the left superior and inferior pubic rami. Although dating is limited by the artifact from the total hip arthroplasties, the adjacent soft tissue edema implies acuity. 2. Presumed focal hematoma in the left gluteus maximus muscle posterior to the ischium.   Electronically Signed   By: Camie Patience M.D.   On: 08/17/2013 19:38    EKG: Independently reviewed. normal sinus rhythm, old T wave inversion in V4 and V5.  Assessment/Plan Principal Problem:   Pelvic fracture Active Problems:   Abnormality of gait   Colon cancer   Fall   Essential tremor   1. Pelvic fracture The patient is presenting with a mechanical fall secondary to her essential tremors. At present the patient will be admitted to hospital for observation as she was made physical therapy support and pain management. IV Dilaudid and continuation of her home oral Dilaudid for pain management. PTOT consultation in the morning.  2. Left-sided weakness Patient has MRI brain which is negative for any acute stroke. Possibility of CVA is highly less likely. PTOT consultation Continue with aspirin  3. Hypertension Continue with atenolol  4. Hypothyroidism Continue with Synthroid  5. Essential tremor Continue with  primidone  6. rheumatoid arthritis Continue with Plaquenil and prednisone at home dose  7. aortic systolic murmur May need outpatient followup.   Consults: Orthopedics  DVT Prophylaxis: subcutaneous Heparin Nutrition: Cardiac diet  Code Status: DNR/DNI  Family Communication: Son was present at bedside, opportunity was given to ask question and all questions were answered satisfactorily at the time of interview. Disposition: Admitted to observation in med-surge unit.  Author: Berle Mull, MD [facility] Pager: [phone] 08/17/2013, 9:23 PM    If 7PM-7AM, please contact night-coverage [email or web address] Password [email or web address]

## 2013-08-17 NOTE — ED Notes (Signed)
Onset 2 days ago tripped over her walker hand has history of hand tremors. Landed of back and hit back of the head. Denies LOC. Since the fall having left hip pain 0/10 at rest with movement 10/10 achy sharp pedal pulse +1 bilateral full sensation.

## 2013-08-18 ENCOUNTER — Telehealth: Payer: Self-pay | Admitting: Neurology

## 2013-08-18 DIAGNOSIS — M069 Rheumatoid arthritis, unspecified: Secondary | ICD-10-CM | POA: Diagnosis present

## 2013-08-18 DIAGNOSIS — I10 Essential (primary) hypertension: Secondary | ICD-10-CM | POA: Diagnosis present

## 2013-08-18 DIAGNOSIS — E039 Hypothyroidism, unspecified: Secondary | ICD-10-CM | POA: Diagnosis present

## 2013-08-18 DIAGNOSIS — S329XXA Fracture of unspecified parts of lumbosacral spine and pelvis, initial encounter for closed fracture: Secondary | ICD-10-CM

## 2013-08-18 DIAGNOSIS — G250 Essential tremor: Secondary | ICD-10-CM | POA: Diagnosis present

## 2013-08-18 DIAGNOSIS — G252 Other specified forms of tremor: Secondary | ICD-10-CM

## 2013-08-18 LAB — URINE CULTURE

## 2013-08-18 LAB — BASIC METABOLIC PANEL
BUN: 11 mg/dL (ref 6–23)
CALCIUM: 8.4 mg/dL (ref 8.4–10.5)
CHLORIDE: 97 meq/L (ref 96–112)
CO2: 26 meq/L (ref 19–32)
Creatinine, Ser: 0.57 mg/dL (ref 0.50–1.10)
GFR calc Af Amer: >90 mL/min (ref >90)
GFR calc non Af Amer: 87 mL/min — ABNORMAL LOW (ref >90)
GLUCOSE: 104 mg/dL — AB (ref 70–99)
Potassium: 3.4 mEq/L — ABNORMAL LOW (ref 3.7–5.3)
SODIUM: 137 meq/L (ref 137–147)

## 2013-08-18 LAB — CBC
HCT: 37.7 % (ref 36.0–46.0)
HEMOGLOBIN: 12.6 g/dL (ref 12.0–15.0)
MCH: 32.4 pg (ref 26.0–34.0)
MCHC: 33.4 g/dL (ref 30.0–36.0)
MCV: 96.9 fL (ref 78.0–100.0)
Platelets: 97 10*3/uL — ABNORMAL LOW (ref 150–400)
RBC: 3.89 MIL/uL (ref 3.87–5.11)
RDW: 13.4 % (ref 11.5–15.5)
WBC: 6.9 10*3/uL (ref 4.0–10.5)

## 2013-08-18 MED ORDER — HYDROMORPHONE HCL 2 MG PO TABS: 2.0000 mg | ORAL_TABLET | Freq: Four times a day (QID) | ORAL | Status: DC | PRN

## 2013-08-18 NOTE — Telephone Encounter (Signed)
Patient's son calling to make Dr. Jannifer Franklin aware that patient fell on Saturday and suffered from pelvic fractures and is now at the hospital. If questions, please call.

## 2013-08-18 NOTE — Care Management Note (Signed)
CARE MANAGEMENT NOTE 08/18/2013  Patient:  [name], [name]   Account Number:  [id number]  Date Initiated:  08/18/2013  Documentation initiated by:  Ricki Miller  Subjective/Objective Assessment:   80 yr old female s/p with superior inferior spura rami fracture.     Action/Plan:   Case manager spoke with pateint and her son-David, concerning [place] and DME needs. [facility] offered. Referral called to [facility]. Son's contact [phone].   Anticipated DC Date:  08/19/2013   Anticipated DC Plan:  [place] [facility]  CM consult      [place]   Choice offered to / List presented to:  C-4 Adult Children   DME arranged  [facility]OSPITAL BED      DME agency  [place] arranged  [place].   Status of service:  Completed, signed off Medicare Important Message given?   (If response is "NO", the following Medicare IM given date fields will be blank) Date Medicare IM given:   Date Additional Medicare IM given:    Discharge Disposition:  [place]  Per UR Regulation:

## 2013-08-18 NOTE — Consult Note (Signed)
Melrose Nakayama, MD  Chauncey Cruel, PA-C  Loni Dolly, PA-C                                  [facility]                [address] [place], [place]  [address]   [place]            MRN:  [id number] DOB/SEX:  1933-05-30/female     CHIEF COMPLAINT:  Painful left hip  HISTORY: [name] a 80 y.o. female with a recent fall at her assisted living center.  She lives with her husband.  She has struggled with a non Parkinson's tremor and steadily worsening gait with a walker.  [facility] two days ago and came to ED one day ago.  Denies LOC with fall.  Having difficulty with weight bearing was evaluated and found to have minimally displaced pelvis fractures.  Both hips have been replaced in past.  Admitted by medical team and ORS consulted   PAST MEDICAL HISTORY: Patient Active Problem List   Diagnosis Date Noted  . Essential tremor 08/18/2013  . Pelvic fracture 08/17/2013  . Fall 08/17/2013  . Personal history of malignant neoplasm of breast 11/05/2012  . Colon cancer 11/05/2012  . Abnormality of gait 05/13/2012  . Intracerebral hemorrhage 05/13/2012  . Encounter for therapeutic drug monitoring 05/13/2012  . Essential and other specified forms of tremor 05/13/2012   Past Medical History  Diagnosis Date  . PE (pulmonary embolism)   . Subarachnoid hemorrhage   . Spastic hemiplegia affecting nondominant side   . Intracerebral hemorrhage   . Hypertension   . Arthritis   . Stroke 3/12  . Gastrointestinal problem   . Depression   . Breast CA     mastectomy  . Colon cancer   . Tremor     benign essential  . Rheumatoid arthritis(714.0)   . Hypothyroidism   . Dysrhythmia     palpitation  . Lymphedema of arm     right  . Atrophic vaginitis   . Osteoporosis   . Multinodular goiter   . Menieres disease 2002  . Thrombocytopenia 2002  . Gastroparesis 7/03  . Colon cancer 2007  . Pulmonary embolism 2003    chronic coumadin  . CVA  (cerebral vascular accident)    Past Surgical History  Procedure Laterality Date  . Fracture surgery  2010    rt wrist  . Mastectomy      bilat mastectomies  . Breast surgery    . Joint replacement      Bilateral total hip replacement  . Hardware removal  03/29/2012    Procedure: HARDWARE REMOVAL;  Surgeon: Alta Corning, MD;  Location: [facility];  Service: Orthopedics;  Laterality: Left;  Screw Removal Left Foot  . Shoulder arthroscopy Right   . Thyroidectomy    . Total hip arthroplasty    . Cholecystectomy    . Tonsillectomy    . Left ankle      ruptured achilles tendon  . Carpal tunnel release Bilateral   . Thoracic outlet surgery Bilateral   . Ulnar nerve transposition Right   . Knee surgery Bilateral   . Lumbar laminectomy    . Nasal sinus surgery    . Cervical laminectomy    . Bunionectomy Right   . Right wrist    .  Squamous cell carcinoma resec       MEDICATIONS:  Current facility-administered medications:aspirin chewable tablet 81 mg, 81 mg, Oral, Daily, Berle Mull, MD;  atenolol (TENORMIN) tablet 25 mg, 25 mg, Oral, Daily, Berle Mull, MD;  colestipol (COLESTID) tablet 2 g, 2 g, Oral, TID WC, Berle Mull, MD;  heparin injection 5,000 Units, 5,000 Units, Subcutaneous, 3 times per day, Berle Mull, MD, 5,000 Units at 08/18/13 [id number] HYDROmorphone (DILAUDID) injection 0.5 mg, 0.5 mg, Intravenous, Q8H PRN, Berle Mull, MD;  HYDROmorphone (DILAUDID) tablet 2 mg, 2 mg, Oral, Q6H PRN, Berle Mull, MD, 2 mg at 08/18/13 0326;  hydroxychloroquine (PLAQUENIL) tablet 200 mg, 200 mg, Oral, BID WC, Berle Mull, MD;  lactase (LACTAID) tablet 9,000 Units, 9,000 Units, Oral, TID WC, Berle Mull, MD, 9,000 Units at 08/18/13 [id number] levothyroxine (SYNTHROID, LEVOTHROID) tablet 400 mcg, 400 mcg, Oral, QAC breakfast, Berle Mull, MD;  LORazepam (ATIVAN) tablet 1 mg, 1 mg, Oral, BID PRN, Berle Mull, MD;  predniSONE (DELTASONE) tablet 5 mg, 5 mg, Oral, Q breakfast, Berle Mull, MD;  primidone (MYSOLINE) tablet 125 mg, 125 mg, Oral, BID, Berle Mull, MD, 125 mg at 2013/09/07 2333;  primidone (MYSOLINE) tablet 50 mg, 50 mg, Oral, QHS, Berle Mull, MD, 50 mg at 09-07-13 2332 sertraline (ZOLOFT) tablet 100 mg, 100 mg, Oral, Daily, Berle Mull, MD  ALLERGIES:   Allergies  Allergen Reactions  . Demerol   . Fentanyl   . Metoclopramide Hcl   . Morphine And Related   . Nsaids     Upset stomach  . Pentazocine   . Pentazocine Lactate   . Versed [Midazolam]     REVIEW OF SYSTEMS: REVIEWED IN DETAIL IN CHART  FAMILY HISTORY:   Family History  Problem Relation Age of Onset  . Cancer Father   . Heart failure Sister   . Tremor Maternal Grandmother   . Tremor Maternal Grandfather   . Tremor Paternal Grandmother   . Tremor Paternal Grandfather     SOCIAL HISTORY:   History  Substance Use Topics  . Smoking status: Former Smoker    Quit date: 02/25/1970  . Smokeless tobacco: Never Used  . Alcohol Use: 0.0 oz/week     Comment: occ wine     EXAMINATION: Vital signs in last 24 hours: Temp:  [97.6 F (36.4 C)-98.4 F (36.9 C)] 97.6 F (36.4 C) (04/13 0454) Pulse Rate:  [59-78] 78 (04/13 0454) Resp:  [10-24] 18 (04/13 0454) BP: (120-167)/(56-86) 139/74 mmHg (04/13 0454) SpO2:  [92 %-100 %] 96 % (04/13 0454) Weight:  [54.432 kg (120 lb)] 54.432 kg (120 lb) 08-Sep-2022 1254)   Musculoskeletal Exam  :both hips move well, leg lengths equal, abdomen nontender, CMS OK, both arms move fully, cervical motion full and head atraumatic   DIAGNOSTIC STUDIES: Recent laboratory studies:  Recent Labs  09/07/2013 1330 08/18/13 0418  WBC 6.0 6.9  HGB 13.4 12.6  HCT 39.8 37.7  PLT 104* 97*    Recent Labs  09-07-13 1330 08/18/13 0418  NA 140 137  K 3.7 3.4*  CL 98 97  CO2 27 26  BUN 18 11  CREATININE 0.62 0.57  GLUCOSE 85 104*  CALCIUM 8.8 8.4   Lab Results  Component Value Date   INR 1.13 2013-09-07   INR 1.50* 07/27/2010   INR 1.7* 08/31/2008    PROTIME 15.7* 09/18/2005     Recent Radiographic Studies :  Dg Hip Complete Left  09/07/13   CLINICAL DATA:  Fall, left hip pain  EXAM: LEFT HIP - COMPLETE 2+ VIEW  COMPARISON:  DG ABD 2 VIEWS dated 07/27/2010; CT PELVIS W/CM dated 05/28/2008  FINDINGS: Bilateral total hip arthroplasties are reidentified. Left femoral prosthetic component is properly located. Cortical deformity of the left superior and inferior pubic rami is noted. This area was incompletely imaged on the most recent dissimilar prior exam 07/27/2010. These findings appear new from the prior exam 05/28/2008. There is a suggestion of minimal callus formation.  IMPRESSION: Left superior and inferior pubic rami deformities which are new since 2010 prior exam but demonstrate minimal callus formation that could indicate these may be subacute. MRI may be helpful for further determination of chronicity of findings if clinically needed.   Electronically Signed   By: Conchita Paris M.D.   On: 08/17/2013 15:08   Ct Head Wo Contrast  08/17/2013   CLINICAL DATA:  Status post fall 2 days ago. History of previous intracranial hemorrhage.  EXAM: CT HEAD WITHOUT CONTRAST  CT CERVICAL SPINE WITHOUT CONTRAST  TECHNIQUE: Multidetector CT imaging of the head and cervical spine was performed following the standard protocol without intravenous contrast. Multiplanar CT image reconstructions of the cervical spine were also generated.  COMPARISON:  CT HEAD W/O CM dated 07/28/2010  FINDINGS: CT HEAD FINDINGS  There is mild diffuse cerebral and cerebellar atrophy with compensatory ventriculomegaly. There is encephalomalacia in the right frontal lobe. There is decreased density in the deep white matter of both cerebral hemispheres consistent with chronic small vessel ischemic type change. The cerebellum and brainstem exhibit normal density. There are no abnormal intracranial calcifications.  At bone window settings the observed portions of the paranasal sinuses are clear.  There is a small amount of fluid within the left mastoid air cells. There is no evidence of an acute skull fracture.  CT CERVICAL SPINE FINDINGS  The cervical vertebral bodies are preserved in height. 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 There is mild degenerative change at C4-5 and C7-T1. There is no evidence of a perched facet. There is mild facet joint hypertrophy at multiple levels. The odontoid is intact. The lateral masses of C1 align normally with those of C2. There is degenerative change of the atlanto-dens articulation and of the articulation of the lateral masses of C1 with C2. The bony ring at each cervical level is intact. There is no acute fracture of the observed portions of the first and second ribs. Old deformities of the first ribs. The pulmonary apices exhibit minimal fibrotic change. The soft tissues of the neck exhibit no acute abnormalities. There is dense calcification in the carotid arterial system.  IMPRESSION: 1. There are chronic changes within the brain as described consistent with previous ischemic insult in the right frontal lobe as well as chronic small vessel ischemic change diffusely. There is no acute intracranial hemorrhage. 2. There is no evidence of an acute skull fracture. 3. There is no evidence of an acute cervical spine fracture nor dislocation. 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   Electronically Signed   By: David  Martinique   On: 08/17/2013 15:49   Ct Cervical Spine Wo Contrast  08/17/2013   CLINICAL DATA:  Status post fall 2 days ago. History of previous intracranial hemorrhage.  EXAM: CT HEAD WITHOUT CONTRAST  CT CERVICAL SPINE WITHOUT CONTRAST  TECHNIQUE: Multidetector CT imaging of the head and cervical spine was performed following the  standard protocol without intravenous contrast. Multiplanar CT image reconstructions of the cervical spine  were also generated.  COMPARISON:  CT HEAD W/O CM dated 07/28/2010  FINDINGS: CT HEAD FINDINGS  There is mild diffuse cerebral and cerebellar atrophy with compensatory ventriculomegaly. There is encephalomalacia in the right frontal lobe. There is decreased density in the deep white matter of both cerebral hemispheres consistent with chronic small vessel ischemic type change. The cerebellum and brainstem exhibit normal density. There are no abnormal intracranial calcifications.  At bone window settings the observed portions of the paranasal sinuses are clear. There is a small amount of fluid within the left mastoid air cells. There is no evidence of an acute skull fracture.  CT CERVICAL SPINE FINDINGS  The cervical vertebral bodies are preserved in height. 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 There is mild degenerative change at C4-5 and C7-T1. There is no evidence of a perched facet. There is mild facet joint hypertrophy at multiple levels. The odontoid is intact. The lateral masses of C1 align normally with those of C2. There is degenerative change of the atlanto-dens articulation and of the articulation of the lateral masses of C1 with C2. The bony ring at each cervical level is intact. There is no acute fracture of the observed portions of the first and second ribs. Old deformities of the first ribs. The pulmonary apices exhibit minimal fibrotic change. The soft tissues of the neck exhibit no acute abnormalities. There is dense calcification in the carotid arterial system.  IMPRESSION: 1. There are chronic changes within the brain as described consistent with previous ischemic insult in the right frontal lobe as well as chronic small vessel ischemic change diffusely. There is no acute intracranial hemorrhage. 2. There is no evidence of an acute skull fracture. 3. There is no evidence of an acute cervical spine fracture nor dislocation. 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   Electronically Signed   By: David  Martinique   On: 08/17/2013 15:49   Mr Brain Wo Contrast  08/17/2013   CLINICAL DATA:  History of breast cancer and stroke. Fall. Numbness or weakness left side. Question new stroke.  EXAM: MRI HEAD WITHOUT CONTRAST  TECHNIQUE: Multiplanar, multiecho pulse sequences of the brain and surrounding structures were obtained without intravenous contrast.  COMPARISON:  08/17/2013 CT.  11/23/2010 MR.  FINDINGS: No acute infarct.  Moderate size remote anterior right frontal lobe infarct with encephalomalacia with associated blood breakdown products.  Scattered blood breakdown products in the temporal lobes bilaterally and less so parietal lobes. This may reflect result of prior hemorrhagic ischemia. Cannot completely exclude trauma contributing to scattered blood breakdown products (such as shear injuries) although felt to be less likely consideration. No evidence of extra-axial blood collection.  Moderate small vessel disease type changes.  Global atrophy. Ventricular prominence may be related to central atrophy although difficult to completely mild component of hydrocephalus. Ventricles are slightly more prominent than on the prior MR although atrophy has also progressed.  No intracranial mass lesion noted on this unenhanced exam.  Partial opacification mastoid air cells bilaterally. No fracture noted on accompanying CT. No obstructing lesion posterior superior nasopharynx causing eustachian tube dysfunction.  Major intracranial vascular structures are patent.  Transverse ligament hypertrophy. Cervical medullary junction, pituitary region, pineal region and orbital structures unremarkable.  IMPRESSION: No acute infarct.  Moderate size remote anterior right frontal  lobe infarct.  Scattered blood breakdown products in the temporal lobes bilaterally and less so parietal lobes may reflect result of prior  hemorrhagic ischemia. Cannot completely exclude trauma contributing to scattered blood breakdown products (such as shear injuries) although felt to be less likely consideration. No evidence of extra-axial blood collection.  Moderate small vessel disease type changes.  Global atrophy. Ventricular prominence may be related to central atrophy although difficult to completely mild component of hydrocephalus.  No intracranial mass lesion noted on this unenhanced exam.  Partial opacification mastoid air cells bilaterally   Electronically Signed   By: Chauncey Cruel M.D.   On: 08/17/2013 19:12   Mr Hip Left Wo Contrast  08/17/2013   CLINICAL DATA:  Left hip pain status post fall. Possible left pelvic fracture on radiographs. History of bilateral total hip arthroplasty.  EXAM: MRI OF THE LEFT HIP WITHOUT CONTRAST  TECHNIQUE: Multiplanar, multisequence MR imaging was performed. No intravenous contrast was administered.  COMPARISON:  DG HIP COMPLETE*L* dated 08/17/2013; CT PELVIS W/CM dated 05/28/2008  FINDINGS: Unfortunately, artifact from the bilateral total hip arthroplasties partially obscures the pubic rami bilaterally. T1 weighted images confirm the presence of nondisplaced fractures of the left superior and inferior pubic rami. There is some marrow edema in the inferior pubic ramus fracture on the T2 weighted images. The right pubic rami, sacrum, symphysis pubis and sacroiliac joints appear intact. There is no evidence of proximal femur fracture or dislocation.  There is some edema within the left proximal thigh adductor musculature. In addition, there is a edema and a probable focal hematoma inferiorly in the left gluteus maximus muscle. There is generalized subcutaneous edema in the pelvis and proximal thighs.  IMPRESSION: 1. Nondisplaced fractures of the left superior and inferior pubic rami. Although dating is limited by the artifact from the total hip arthroplasties, the adjacent soft tissue edema implies acuity.  2. Presumed focal hematoma in the left gluteus maximus muscle posterior to the ischium.   Electronically Signed   By: Camie Patience M.D.   On: 08/17/2013 19:38    ASSESSMENT:  Left sided ischial fractures   PLAN:  These are generally very stable fractures.  She may weight bear as tolerated.  Will require some time to regain ability to stand and walk especially in light of her preexisting neurologic condition.  May require a rehab stay prior to returning to assisted living.  Will see how she does over next few days and will follow with medical team.  Hessie Dibble 08/18/2013, 8:57 AM

## 2013-08-18 NOTE — Telephone Encounter (Signed)
Events noted, I did not call the patient. The patient currently is in the hospital.

## 2013-08-18 NOTE — Progress Notes (Signed)
Pt very restless during the night. Calling frequently for bedpan even though she is often incontinent. As the night progressed, she started becoming more confused about her location. Vital signs and Oxygen saturation remain stable. Dilaudid 2 mg tab seems to relieve her of her pain, although when she is turned she complains of pain in her right hip and her back.

## 2013-08-18 NOTE — Discharge Summary (Addendum)
Physician Discharge Summary  [name] HWE:[id number] DOB: 1933/10/10 DOA: 08/17/2013  PCP: Suszanne Conners, MD  Admit date: 08/17/2013 Discharge date: 08/19/2013  Time spent: 25 minutes  Recommendations for Outpatient Follow-up:  1. Patient is being discharged back to her assisted living facility. She will receive full home health services including PT, OT and aide plus equipment 2. Patient's Dilaudid when necessary is being changed from every 8 hours as needed 2 every 6 hours as needed to better control her pain. A prescription has been given.  Discharge Diagnoses:  Principal Problem:   Pelvic fracture: Evaluate by orthopedic surgery. This type of fracture did not need surgery. They recommended weightbearing as tolerated.  After evaluation with physical and occupational therapy, initially skilled nursing was recommended, however patient felt very strongly about going back to her assisted living facility to be with her husband. After some discussion and planning, full home health services including physical and occupational therapy plus aide plus equipment will be setup for discharge Active Problems:   Abnormality of gait: Secondary to tremor chronically an acute fall. See above   Hypertension: Continue on atenolol.    Fall: See above.    Essential tremor: December 14 she was asked to reduce her dose of primidone from 250-125 twice a day to 125 twice a day. Since her symptoms are progressively worsening family requested to increase her dose of primidone and patient was placed on 50 mg each bedtime extra dose other than 125 every 12 hours needed  Other than this no other recent change in her medications.  Hypothyroidism: Continue on Synthroid.  Rheumatoid arthritis: Continued on Plaquenil and prednisone at home dose  History CVA: Stable. MRI negative for any acute stroke. Continue with aspirin.   Discharge Condition: Improved, being discharged back to her assisted living  facility, but with full home health services  Diet recommendation: Low-sodium  Filed Weights   08/17/13 1254  Weight: 54.432 kg (120 lb)    History of present illness:  80 year old white female past medical history of CVA, rheumatoid arthritis and hypertension presented to the emergency room on 4/12 with complaints of mechanical fall which occurred after she lost her balance from her walker to 2 tremors. Patient landed on her left side and had pain with movement so she was brought into the hospital. In the emergency room, she was noted to have no signs of a hip fracture, but was noted to have superior and inferior pubic rami fractures. She was admitted to the hospitalist service.  Hospital Course:  Principal Problem:   Pelvic fracture Active Problems:   Abnormality of gait   Colon cancer   Fall   Essential tremor  Procedures:  None  Consultations:  Orthopedic surgery  Discharge Exam: Filed Vitals:   08/18/13 1200  BP:   Pulse:   Temp:   Resp: 18    General: Alert and oriented x2, no acute distress Cardiovascular: Regular rate and rhythm, S1-S2 Respiratory: Clear to auscultation bilaterally  Discharge Instructions You were cared for by a hospitalist during your hospital stay. If you have any questions about your discharge medications or the care you received while you were in the hospital after you are discharged, you can call the unit and asked to speak with the hospitalist on call if the hospitalist that took care of you is not available. Once you are discharged, your primary care physician will handle any further medical issues. Please note that NO REFILLS for any discharge medications will be authorized once  you are discharged, as it is imperative that you return to your primary care physician (or establish a relationship with a primary care physician if you do not have one) for your aftercare needs so that they can reassess your need for medications and monitor your lab  values.   Future Appointments Provider Department Dept Phone   10/28/2013 3:30 PM Kathrynn Ducking, MD [facility] [phone]   11/05/2013 3:00 PM Wl-Dg 4 (Chest) [facility] [phone]   11/10/2013 3:00 PM [facility] [phone]   11/11/2013 3:15 PM Curt Bears, MD [facility] [phone]   08/04/2014 3:30 PM Ladell Heads [facility] [phone]       Medication List         aspirin 81 MG tablet  Take 81 mg by mouth daily.     atenolol 25 MG tablet  Commonly known as:  TENORMIN  Take 25 mg by mouth daily.     calcium carbonate 500 MG chewable tablet  Commonly known as:  TUMS - dosed in mg elemental calcium  Chew 1 tablet by mouth 4 (four) times daily.     colestipol 1 G tablet  Commonly known as:  COLESTID  Take 2 g by mouth 3 (three) times daily with meals.     HYDROmorphone 2 MG tablet  Commonly known as:  DILAUDID  Take 1 tablet (2 mg total) by mouth every 6 (six) hours as needed.     hydroxychloroquine 200 MG tablet  Commonly known as:  PLAQUENIL  Take 200 mg by mouth 2 (two) times daily with a meal.     LACTASE PO  Take 2 tablets by mouth 3 (three) times daily with meals.     levothyroxine 200 MCG tablet  Commonly known as:  SYNTHROID, LEVOTHROID  Take 400 mcg by mouth daily before breakfast.     loperamide 2 MG capsule  Commonly known as:  IMODIUM  Take by mouth as needed for diarrhea or loose stools.     LORazepam 0.5 MG tablet  Commonly known as:  ATIVAN  Take 1 mg by mouth 2 (two) times daily.     oyster calcium 500 MG Tabs tablet  Take 500 mg of elemental calcium by mouth daily.     predniSONE 5 MG tablet  Commonly known as:  DELTASONE  Take 5 mg by mouth daily with breakfast.     primidone 250 MG tablet  Commonly known as:  MYSOLINE  Take 125 mg by mouth 2 (two) times daily.      primidone 50 MG tablet  Commonly known as:  MYSOLINE  Take 1 tablet (50 mg total) by mouth at bedtime.     REFRESH 1 % ophthalmic solution  Generic drug:  carboxymethylcellulose  Apply 1-2 drops to eye at bedtime.     REFRESH OPTIVE OP  Apply 1-2 drops to eye.     sertraline 100 MG tablet  Commonly known as:  ZOLOFT  Take 100 mg by mouth daily.     vitamin C 500 MG tablet  Commonly known as:  ASCORBIC ACID  Take 500 mg by mouth daily.     Vitamin D 2000 UNITS Caps  Take 1 capsule by mouth daily.        Allergies  Allergen Reactions  . Demerol   . Fentanyl   . Metoclopramide Hcl   . Morphine And Related   .  Nsaids     Upset stomach  . Pentazocine   . Pentazocine Lactate   . Versed [Midazolam]        Follow-up Information   Follow up with [facility]. (Someone from [facility] will contact you concerning start date and time for PT/OT.and nurse's aide.)    Contact information:   [address] [place] [address] [phone]        The results of significant diagnostics from this hospitalization (including imaging, microbiology, ancillary and laboratory) are listed below for reference.    Significant Diagnostic Studies: Dg Hip Complete Left  08/17/2013   .  IMPRESSION: Left superior and inferior pubic rami deformities which are new since 2010 prior exam but demonstrate minimal callus formation that could indicate these may be subacute. MRI may be helpful for further determination of chronicity of findings if clinically needed.   Electronically Signed   By: Conchita Paris M.D.   On: 08/17/2013 15:08     Ct brain plus Cervical Spine Wo Contrast  08/17/2013     IMPRESSION: 1. There are chronic changes within the brain as described consistent with previous ischemic insult in the right frontal lobe as well as chronic small vessel ischemic change diffusely. There is no acute intracranial hemorrhage. 2. There is no evidence of  an acute skull fracture. 3. There is no evidence of an acute cervical spine fracture nor dislocation. 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   Electronically Signed   By: David  Martinique   On: 08/17/2013 15:49   Mr Brain Wo Contrast  08/17/2013    IMPRESSION: No acute infarct.  Moderate size remote anterior right frontal lobe infarct.  Scattered blood breakdown products in the temporal lobes bilaterally and less so parietal lobes may reflect result of prior hemorrhagic ischemia. Cannot completely exclude trauma contributing to scattered blood breakdown products (such as shear injuries) although felt to be less likely consideration. No evidence of extra-axial blood collection.  Moderate small vessel disease type changes.  Global atrophy. Ventricular prominence may be related to central atrophy although difficult to completely mild component of hydrocephalus.  No intracranial mass lesion noted on this unenhanced exam.  Partial opacification mastoid air cells bilaterally   Electronically Signed   By: Chauncey Cruel M.D.   On: 08/17/2013 19:12   Mr Hip Left Wo Contrast  08/17/2013    IMPRESSION: 1. Nondisplaced fractures of the left superior and inferior pubic rami. Although dating is limited by the artifact from the total hip arthroplasties, the adjacent soft tissue edema implies acuity. 2. Presumed focal hematoma in the left gluteus maximus muscle posterior to the ischium.   Electronically Signed   By: Camie Patience M.D.   On: 08/17/2013 19:38    Microbiology: No results found for this or any previous visit (from the past 240 hour(s)).   Labs: Basic Metabolic Panel:  Recent Labs Lab 08/17/13 1330 08/18/13 0418  NA 140 137  K 3.7 3.4*  CL 98 97  CO2 27 26  GLUCOSE 85 104*  BUN 18 11  CREATININE 0.62 0.57  CALCIUM 8.8 8.4   Liver Function Tests: No results found for this basename: AST, ALT, ALKPHOS,  BILITOT, PROT, ALBUMIN,  in the last 168 hours No results found for this basename: LIPASE, AMYLASE,  in the last 168 hours No results found for this basename: AMMONIA,  in  the last 168 hours CBC:  Recent Labs Lab 08/17/13 1330 08/18/13 0418  WBC 6.0 6.9  NEUTROABS 4.8  --   HGB 13.4 12.6  HCT 39.8 37.7  MCV 98.0 96.9  PLT 104* 97*      Signed:  Aliviyah Malanga K Rex Magee  [facility] 08/18/2013, 4:51 PM

## 2013-08-18 NOTE — Evaluation (Signed)
Physical Therapy Evaluation Patient Details Name: [name] MRN: [id number] DOB: [birth year] Today's Date: 08/18/2013   History of Present Illness  [name] a 80 y.o. female with a recent fall at her assisted living center.  She lives with her husband.  She has struggled with a non Parkinson's tremor and steadily worsening gait with a walker.  [address] two days ago and came to ED one day ago.  Denies LOC with fall.  Having difficulty with weight bearing was evaluated and found to have minimally displaced pelvis fractures.  Both hips have been replaced in past.    Clinical Impression  Pt admitted with above. Pt currently with functional limitations due to the deficits listed below (see PT Problem List).  Pt will benefit from skilled PT to increase their independence and safety with mobility to allow discharge to the venue listed below.   Lengthy discussion re: dc to postacute rehab versus dc back home to ALF and have therapy there; Son reports 24 hour assist is available to pt, however it is contingent on pt pressing call button and asking for assist; Pt and son indicate that she would rather go back home with husband and familiar staff and familiar routine -- this therapist is ok with this as long as pt uses the 24 hour assist that is available to her.     Follow Up Recommendations Home health PT;Supervision/Assistance - 24 hour    Equipment Recommendations  3in1 (PT);Wheelchair cushion (measurements PT);Hospital bed    Recommendations for Other Services OT consult     Precautions / Restrictions Precautions Precautions: Fall Restrictions Weight Bearing Restrictions: Yes RLE Weight Bearing: Weight bearing as tolerated LLE Weight Bearing: Weight bearing as tolerated      Mobility  Bed Mobility Overal bed mobility: Needs Assistance Bed Mobility: Rolling;Sidelying to Sit Rolling: Mod assist (Rolling R; did not tolerate rolling Left) Sidelying to sit: Mod assist        General bed mobility comments: Mod assist to clear feet from EOB and elevate trunk sidelying to sit; hesitant to move due to anticipation of pain; Good use of rails for rolling and help with getting up  Transfers Overall transfer level: Needs assistance Equipment used: Rolling walker (2 wheeled) Transfers: Sit to/from Stand Sit to Stand: Mod assist (light mod assist)         General transfer comment: Cues for technique and hand placement; light mod assist to initiate power-up  Ambulation/Gait Ambulation/Gait assistance: Min assist;+2 safety/equipment (second person to push chair) Ambulation Distance (Feet): 20 Feet Assistive device: Rolling walker (2 wheeled) Gait Pattern/deviations: Decreased step length - right;Decreased step length - left;Decreased stance time - right;Decreased stance time - left;Decreased stride length;Shuffle Gait velocity: quite slow   General Gait Details: Very short steps, with tactile cues to weight shift into stance both R and L to [place] advancing LE  Stairs            Wheelchair Mobility    Modified Rankin (Stroke Patients Only)       Balance Overall balance assessment: Needs assistance Sitting-balance support: Bilateral upper extremity supported Sitting balance-Leahy Scale: Poor Sitting balance - Comments: sitting tolerance limited by pain   Standing balance support: Bilateral upper extremity supported Standing balance-Leahy Scale: Poor Standing balance comment: Dependent on UEs for support                             Pertinent Vitals/Pain 5/10 pain L hip  sitting EOB patient repositioned for comfort     Home Living Family/patient expects to be discharged to:: Assisted living               Home Equipment: Walker - 2 wheels;Wheelchair - manual Additional Comments: Apparently pt has 24 hour assist available to her (she has to ask for help)    Prior Function Level of Independence: Needs assistance   Gait /  Transfers Assistance Needed: with RW; and gait has been worsening here recently  ADL's / Homemaking Assistance Needed: bath assist is available        Hand Dominance        Extremity/Trunk Assessment   Upper Extremity Assessment: Defer to OT evaluation           Lower Extremity Assessment: Generalized weakness;LLE deficits/detail   LLE Deficits / Details: Grossly limited movement in all planes     Communication   Communication: HOH;Other (comment) (at times, pt is difficult to understand)  Cognition Arousal/Alertness: Awake/alert Behavior During Therapy: WFL for tasks assessed/performed Overall Cognitive Status: Within Functional Limits for tasks assessed                      General Comments      Exercises        Assessment/Plan    PT Assessment Patient needs continued PT services  PT Diagnosis Difficulty walking;Abnormality of gait;Generalized weakness;Acute pain   PT Problem List Decreased strength;Decreased range of motion;Decreased activity tolerance;Decreased balance;Decreased mobility;Decreased coordination;Decreased knowledge of use of DME;Decreased safety awareness;Pain  PT Treatment Interventions DME instruction;Gait training;Functional mobility training;Therapeutic activities;Therapeutic exercise;Balance training;Neuromuscular re-education;Patient/family education;Wheelchair mobility training   PT Goals (Current goals can be found in the Care Plan section) Acute Rehab PT Goals Patient Stated Goal: Pt would like to go back home with husband; family would also like this PT Goal Formulation: With patient Time For Goal Achievement: 08/25/13 Potential to Achieve Goals: Good    Frequency Min 5X/week   Barriers to discharge   Pt will need to fully utilize the 24 hour assist that is available to her in order to dc back to [facility]    Co-evaluation               End of Session Equipment Utilized During Treatment: Gait belt Activity  Tolerance: Patient limited by pain Patient left: in chair;with call bell/phone within reach;with family/visitor present Nurse Communication: Mobility status    Functional Assessment Tool Used: Clinical Judgement Functional Limitation: Mobility: Walking and moving around Mobility: Walking and Moving Around Current Status [id number]): At least 40 percent but less than 60 percent impaired, limited or restricted Mobility: Walking and Moving Around Goal Status [id number]): At least 1 percent but less than 20 percent impaired, limited or restricted    Time: 1219-1300 PT Time Calculation (min): 41 min   Charges:   PT Evaluation $Initial PT Evaluation Tier I: 1 Procedure PT Treatments $Gait Training: 8-22 mins $Therapeutic Activity: 8-22 mins   PT G Codes:   Functional Assessment Tool Used: Clinical Judgement Functional Limitation: Mobility: Walking and moving around    [facility] 08/18/2013, 2:25 PM Roney Marion, [place] Pager [phone] Office [phone]

## 2013-08-18 NOTE — Progress Notes (Signed)
UR completed 

## 2013-08-18 NOTE — Telephone Encounter (Signed)
Spoke with son and he said that patient is doing fairly well,was just coming out of PT,still a lot of pain

## 2013-08-19 DIAGNOSIS — M25559 Pain in unspecified hip: Secondary | ICD-10-CM

## 2013-08-19 NOTE — Evaluation (Signed)
Occupational Therapy Evaluation Patient Details Name: [name] MRN: [id number] DOB: 09/08/1933 Today's Date: 08/19/2013    History of Present Illness [name] a 80 y.o. female with a recent fall at her assisted living center.  She lives with her husband.  She has struggled with a non Parkinson's tremor and steadily worsening gait with a walker.  [address] two days ago and came to ED one day ago.  Denies LOC with fall.  Having difficulty with weight bearing was evaluated and found to have minimally displaced pelvis fractures.  Both hips have been replaced in past.    Clinical Impression   Pt admitted with the above diagnoses. Pt plans to d/c today back to assisted living facility. Pt declined in performance of bed mobility and transfers this date. Pt needing +2 physical assistance for supine >EOB, remaining transfers/bed mobility completed with max to +2 safety. Pt stood beside bed for 1.5 minutes with mod-max A. Discussed with patient, nurse, and physician need for higher level of care than she was receiving PTA. Educated pt on using call bell at assisted living facility any time she needs assistance. Pt stated she wants to go home but also feels nervous it. Per nurse pt may have additional family staying with her post d/c.    Follow Up Recommendations  Supervision/Assistance - 24 hour;Other (comment);Home health OT (Pt needing higher level of care upon d/c than receiving PTA)    Equipment Recommendations  Other (comment) (defer to next venue)    Recommendations for Other Services       Precautions / Restrictions Precautions Precautions: Fall Restrictions Weight Bearing Restrictions: Yes RLE Weight Bearing: Weight bearing as tolerated LLE Weight Bearing: Weight bearing as tolerated      Mobility Bed Mobility Overal bed mobility: Needs Assistance Bed Mobility: Rolling;Supine to Sit;Sit to Supine Rolling: Total assist   Supine to sit: +2 for physical assistance;HOB  elevated Sit to supine: Max assist   General bed mobility comments: max A to total +2  Transfers Overall transfer level: Needs assistance Equipment used: Rolling walker (2 wheeled) Transfers: Sit to/from Stand Sit to Stand: +2 safety/equipment;Max assist         General transfer comment: pt having difficulty initiating movements suspect due to cognition    Balance Overall balance assessment: Needs assistance Sitting-balance support: Bilateral upper extremity supported;Feet supported Sitting balance-Leahy Scale: Poor Sitting balance - Comments: cues for hand placement   Standing balance support: Bilateral upper extremity supported Standing balance-Leahy Scale: Poor Standing balance comment: mod-max A to maintain standing balance                            ADL Overall ADL's : Needs assistance/impaired Eating/Feeding: Set up;Sitting   Grooming: Set up;Sitting   Upper Body Bathing: Sitting;Bed level;Moderate assistance   Lower Body Bathing: Sit to/from stand;Cueing for safety;Bed level;+2 for safety/equipment   Upper Body Dressing : Set up;Sitting;Bed level   Lower Body Dressing: Total assistance;Sit to/from stand;+2 for safety/equipment   Toilet Transfer:  (wears diaper)   Toileting- Clothing Manipulation and Hygiene: +2 for safety/equipment;Bed level;Total assistance;Sit to/from stand (donning/doffing diaper) Toileting - Clothing Manipulation Details (indicate cue type and reason): +2 safety/equipment to doff diaper in standing, pt fatigued and donning diaper completed in supine with +2 safety/equipment using logroll technique Tub/ Shower Transfer: +2 for safety/equipment;Stand-pivot;3 in 1;Rolling walker   Functional mobility during ADLs: +2 for safety/equipment;Cueing for safety;Rolling walker General ADL Comments: Pt supine>EOB with +  2 phys A for UB and LB. Pt stated this was her first time out of bed today; stiffness noted. Pt had trouble initiating  transfers today with verbal and tactile cues given. Pt EOB>standing +2 safety; stood beside bed 1.5 minutes with mod A . Short rest break given then pt sit>stand max A and maintained standing with max A  for 20 seconds. Pt required max cues to initiate EOB>supine with total A given to BLE.     Vision                     Perception     Praxis      Pertinent Vitals/Pain Pain in left hip. Nurse present and aware of pain. Increased activity during session.     Hand Dominance     Extremity/Trunk Assessment Upper Extremity Assessment Upper Extremity Assessment: LUE deficits/detail;RUE deficits/detail RUE Deficits / Details: AROM at EOB 100 degrees; pain in shoulder at end range LUE Deficits / Details: AROM EOB 90 degrees; preexisting tremors, weak grasp LUE Coordination: decreased fine motor   Lower Extremity Assessment Lower Extremity Assessment: Defer to PT evaluation       Communication Communication Communication: HOH;Other (comment)   Cognition Arousal/Alertness: Awake/alert Behavior During Therapy: WFL for tasks assessed/performed Overall Cognitive Status: No family/caregiver present to determine baseline cognitive functioning                     General Comments       Exercises       Shoulder Instructions      Home Living Family/patient expects to be discharged to:: Assisted living                             Home Equipment: Walker - 2 wheels;Wheelchair - manual   Additional Comments: Pt has to ring a bell to request assistance. Pt stated help station is across the hall from her room.      Prior Functioning/Environment Level of Independence: Needs assistance             OT Diagnosis: Acute pain;Generalized weakness;Hemiplegia non-dominant side   OT Problem List: Decreased strength;Decreased range of motion;Decreased activity tolerance;Impaired balance (sitting and/or standing);Decreased coordination;Decreased  cognition;Decreased safety awareness;Decreased knowledge of use of DME or AE;Decreased knowledge of precautions;Impaired sensation;Impaired tone;Impaired UE functional use;Pain   OT Treatment/Interventions:      OT Goals(Current goals can be found in the care plan section) Acute Rehab OT Goals Patient Stated Goal: wants to go home, feels uneasy after mobility tasks this session OT Goal Formulation: With patient  OT Frequency:     Barriers to D/C:            Co-evaluation              End of Session Equipment Utilized During Treatment: Gait belt;Rolling walker Nurse Communication: Mobility status;Other (comment) (d/c needs)  Activity Tolerance: Patient limited by pain;Patient limited by fatigue Patient left: in bed;with nursing/sitter in room;with call bell/phone within reach   Time: 1000-1026 OT Time Calculation (min): 26 min Charges:  OT General Charges $OT Visit: 1 Procedure OT Evaluation $Initial OT Evaluation Tier I: 1 Procedure OT Treatments $Therapeutic Activity: 8-22 mins G-Codes: OT G-codes **NOT FOR INPATIENT CLASS** Functional Assessment Tool Used: clinical judgement Functional Limitation: Self care Self Care Current Status (R4270): At least 80 percent but less than 100 percent impaired, limited or restricted  Hortencia Pilar 08/19/2013, 11:37 AM

## 2013-08-19 NOTE — Progress Notes (Addendum)
Clinical Social Work Department BRIEF PSYCHOSOCIAL ASSESSMENT 08/19/2013  Patient:  [name], [name]     Account Number:  [id number]     Admit date:  08/17/2013  Clinical Social Worker:  Adair Laundry  Date/Time:  08/19/2013 11:45 AM  Referred by:  Physician  Date Referred:  08/19/2013 Referred for  ALF Placement   Other Referral:   Interview type:  Family Other interview type:   Spoke with pt son over the phone    PSYCHOSOCIAL DATA Living Status:  FACILITY Admitted from facility:  [facility] Level of care:  Assisted Living Primary support name:  Justa Hatchell [phone] Primary support relationship to patient:  CHILD, ADULT Degree of support available:   Pt has supportive family    CURRENT CONCERNS Current Concerns  Post-Acute Placement   Other Concerns:    SOCIAL WORK ASSESSMENT / PLAN CSW received call from nurse and MD informing that pt is ready for dc back to ALF. CSW explained that ALF will need to approve this prior to pt returning. CSW called pt son Shanon Brow and confirmed pt was admitted from [facility] and plan is to return. Pt son informed CSW that facility is already aware of pt need for hospital bed and home health. CSW explained that CSW will need to send some requested clinicals to facility to confirm they can still meet pt needs. Pt son understanding of this. Pt other son, Cam, is on his way to the hospital and will be providing transport when pt is ready to be discharged.  CSW called [facility] and spoke to Industry and informed that CSW has faxed clinicals for review. Facility to call CSW once information has been reviewed to confirm if pt can return.   Assessment/plan status:  Psychosocial Support/Ongoing Assessment of Needs Other assessment/ plan:   Information/referral to community resources:   None needed    PATIENT'S/FAMILY'S RESPONSE TO PLAN OF CARE: Pt family wanting pt to return to [place], [place]

## 2013-08-19 NOTE — Progress Notes (Signed)
Subjective:    80 year old patient with left sided ischial fractures after a fall at her assisted living facility.  Patient is doing well. She is standing and working with physical therapy. Nurse is present and states that she is being discharged per medicine back to her assisted living facility today.  Activity level:  WBAT Diet tolerance:  eating Voiding:  ok Patient reports pain as 3 on 0-10 scale.    Objective: Vital signs in last 24 hours: Temp:  [98 F (36.7 C)-98.8 F (37.1 C)] 98 F (36.7 C) (04/14 0730) Pulse Rate:  [63-78] 78 (04/14 0730) Resp:  [18] 18 (04/14 0800) BP: (113-144)/(57-69) 122/64 mmHg (04/14 0730) SpO2:  [97 %-98 %] 98 % (04/14 0730)  Labs:  Recent Labs  08/17/13 1330 08/18/13 0418  HGB 13.4 12.6    Recent Labs  08/17/13 1330 08/18/13 0418  WBC 6.0 6.9  RBC 4.06 3.89  HCT 39.8 37.7  PLT 104* 97*    Recent Labs  08/17/13 1330 08/18/13 0418  NA 140 137  K 3.7 3.4*  CL 98 97  CO2 27 26  BUN 18 11  CREATININE 0.62 0.57  GLUCOSE 85 104*  CALCIUM 8.8 8.4    Recent Labs  08/17/13 1330  INR 1.13    Physical Exam:  Neurologically intact ABD soft Neurovascular intact Sensation intact distally Dorsiflexion/Plantar flexion intact  Assessment/Plan: Left sided ischial fractures      Advance diet Up with therapy Discharge per medicine team Follow up in office in 2 weeks.    Larwance Sachs Toleen Lachapelle 08/19/2013, 10:21 AM

## 2013-08-19 NOTE — Progress Notes (Signed)
Pts IV D/Cd and assisted Pt with getting dressed. EMS at bedside to transport Pt back to assisted living. Pts son in room at this time and is aware of Pts D/C plan.

## 2013-08-19 NOTE — Discharge Summary (Signed)
See d/c summ from 4/13 by Gevena Barre- no overnight events- being d/c'd to [place] [facility]

## 2013-08-21 ENCOUNTER — Encounter: Payer: Self-pay | Admitting: Neurology

## 2013-08-23 ENCOUNTER — Emergency Department (HOSPITAL_COMMUNITY): Payer: Medicare Other

## 2013-08-23 ENCOUNTER — Encounter (HOSPITAL_COMMUNITY): Payer: Self-pay | Admitting: Emergency Medicine

## 2013-08-23 ENCOUNTER — Observation Stay (HOSPITAL_COMMUNITY)
Admission: EM | Admit: 2013-08-23 | Discharge: 2013-08-26 | Disposition: A | Discharge status: Skilled Nursing Facility | Payer: Medicare Other | Attending: Internal Medicine | Admitting: Internal Medicine

## 2013-08-23 DIAGNOSIS — W19XXXS Unspecified fall, sequela: Secondary | ICD-10-CM | POA: Insufficient documentation

## 2013-08-23 DIAGNOSIS — Z66 Do not resuscitate: Secondary | ICD-10-CM | POA: Insufficient documentation

## 2013-08-23 DIAGNOSIS — G250 Essential tremor: Secondary | ICD-10-CM

## 2013-08-23 DIAGNOSIS — M810 Age-related osteoporosis without current pathological fracture: Secondary | ICD-10-CM | POA: Insufficient documentation

## 2013-08-23 DIAGNOSIS — Z87891 Personal history of nicotine dependence: Secondary | ICD-10-CM | POA: Insufficient documentation

## 2013-08-23 DIAGNOSIS — R259 Unspecified abnormal involuntary movements: Secondary | ICD-10-CM | POA: Insufficient documentation

## 2013-08-23 DIAGNOSIS — Z85038 Personal history of other malignant neoplasm of large intestine: Secondary | ICD-10-CM | POA: Insufficient documentation

## 2013-08-23 DIAGNOSIS — W19XXXA Unspecified fall, initial encounter: Secondary | ICD-10-CM

## 2013-08-23 DIAGNOSIS — Z7901 Long term (current) use of anticoagulants: Secondary | ICD-10-CM | POA: Insufficient documentation

## 2013-08-23 DIAGNOSIS — S329XXA Fracture of unspecified parts of lumbosacral spine and pelvis, initial encounter for closed fracture: Secondary | ICD-10-CM

## 2013-08-23 DIAGNOSIS — IMO0002 Reserved for concepts with insufficient information to code with codable children: Secondary | ICD-10-CM | POA: Insufficient documentation

## 2013-08-23 DIAGNOSIS — C189 Malignant neoplasm of colon, unspecified: Secondary | ICD-10-CM

## 2013-08-23 DIAGNOSIS — Z853 Personal history of malignant neoplasm of breast: Secondary | ICD-10-CM

## 2013-08-23 DIAGNOSIS — E559 Vitamin D deficiency, unspecified: Secondary | ICD-10-CM

## 2013-08-23 DIAGNOSIS — C50919 Malignant neoplasm of unspecified site of unspecified female breast: Secondary | ICD-10-CM

## 2013-08-23 DIAGNOSIS — Z9010 Acquired absence of unspecified breast and nipple: Secondary | ICD-10-CM | POA: Insufficient documentation

## 2013-08-23 DIAGNOSIS — G8911 Acute pain due to trauma: Principal | ICD-10-CM | POA: Insufficient documentation

## 2013-08-23 DIAGNOSIS — M8448XA Pathological fracture, other site, initial encounter for fracture: Secondary | ICD-10-CM

## 2013-08-23 DIAGNOSIS — I10 Essential (primary) hypertension: Secondary | ICD-10-CM

## 2013-08-23 DIAGNOSIS — G252 Other specified forms of tremor: Secondary | ICD-10-CM

## 2013-08-23 DIAGNOSIS — E039 Hypothyroidism, unspecified: Secondary | ICD-10-CM

## 2013-08-23 DIAGNOSIS — R269 Unspecified abnormalities of gait and mobility: Secondary | ICD-10-CM

## 2013-08-23 DIAGNOSIS — R52 Pain, unspecified: Secondary | ICD-10-CM

## 2013-08-23 DIAGNOSIS — F329 Major depressive disorder, single episode, unspecified: Secondary | ICD-10-CM | POA: Insufficient documentation

## 2013-08-23 DIAGNOSIS — Z96649 Presence of unspecified artificial hip joint: Secondary | ICD-10-CM | POA: Insufficient documentation

## 2013-08-23 DIAGNOSIS — Z7982 Long term (current) use of aspirin: Secondary | ICD-10-CM | POA: Insufficient documentation

## 2013-08-23 DIAGNOSIS — I619 Nontraumatic intracerebral hemorrhage, unspecified: Secondary | ICD-10-CM

## 2013-08-23 DIAGNOSIS — F3289 Other specified depressive episodes: Secondary | ICD-10-CM | POA: Insufficient documentation

## 2013-08-23 DIAGNOSIS — F32A Depression, unspecified: Secondary | ICD-10-CM

## 2013-08-23 DIAGNOSIS — Z5181 Encounter for therapeutic drug level monitoring: Secondary | ICD-10-CM

## 2013-08-23 DIAGNOSIS — G8110 Spastic hemiplegia affecting unspecified side: Secondary | ICD-10-CM | POA: Insufficient documentation

## 2013-08-23 DIAGNOSIS — Z86711 Personal history of pulmonary embolism: Secondary | ICD-10-CM | POA: Insufficient documentation

## 2013-08-23 DIAGNOSIS — Z8673 Personal history of transient ischemic attack (TIA), and cerebral infarction without residual deficits: Secondary | ICD-10-CM | POA: Insufficient documentation

## 2013-08-23 DIAGNOSIS — R262 Difficulty in walking, not elsewhere classified: Secondary | ICD-10-CM | POA: Insufficient documentation

## 2013-08-23 DIAGNOSIS — Z4789 Encounter for other orthopedic aftercare: Secondary | ICD-10-CM | POA: Insufficient documentation

## 2013-08-23 DIAGNOSIS — M069 Rheumatoid arthritis, unspecified: Secondary | ICD-10-CM

## 2013-08-23 LAB — CBC WITH DIFFERENTIAL/PLATELET
BASOS ABS: 0 10*3/uL (ref 0.0–0.1)
Basophils Relative: 0 % (ref 0–1)
EOS ABS: 0.1 10*3/uL (ref 0.0–0.7)
EOS PCT: 2 % (ref 0–5)
HCT: 37.9 % (ref 36.0–46.0)
Hemoglobin: 12.7 g/dL (ref 12.0–15.0)
LYMPHS ABS: 0.8 10*3/uL (ref 0.7–4.0)
LYMPHS PCT: 11 % — AB (ref 12–46)
MCH: 32.7 pg (ref 26.0–34.0)
MCHC: 33.5 g/dL (ref 30.0–36.0)
MCV: 97.7 fL (ref 78.0–100.0)
Monocytes Absolute: 0.7 10*3/uL (ref 0.1–1.0)
Monocytes Relative: 9 % (ref 3–12)
NEUTROS PCT: 78 % — AB (ref 43–77)
Neutro Abs: 5.8 10*3/uL (ref 1.7–7.7)
Platelets: 142 10*3/uL — ABNORMAL LOW (ref 150–400)
RBC: 3.88 MIL/uL (ref 3.87–5.11)
RDW: 13.2 % (ref 11.5–15.5)
WBC: 7.5 10*3/uL (ref 4.0–10.5)

## 2013-08-23 LAB — BASIC METABOLIC PANEL
BUN: 19 mg/dL (ref 6–23)
CO2: 25 mEq/L (ref 19–32)
CREATININE: 0.64 mg/dL (ref 0.50–1.10)
Calcium: 8.7 mg/dL (ref 8.4–10.5)
Chloride: 94 mEq/L — ABNORMAL LOW (ref 96–112)
GFR calc Af Amer: >90 mL/min (ref >90)
GFR, EST NON AFRICAN AMERICAN: 84 mL/min — AB (ref >90)
Glucose, Bld: 113 mg/dL — ABNORMAL HIGH (ref 70–99)
Potassium: 4.2 mEq/L (ref 3.7–5.3)
SODIUM: 132 meq/L — AB (ref 137–147)

## 2013-08-23 LAB — TYPE AND SCREEN
ABO/RH(D): O POS
ANTIBODY SCREEN: NEGATIVE

## 2013-08-23 LAB — PROTIME-INR
INR: 1.07 (ref 0.00–1.49)
Prothrombin Time: 13.7 seconds (ref 11.6–15.2)

## 2013-08-23 MED ORDER — SODIUM CHLORIDE 0.9 % IV SOLN
1000.0000 mL | INTRAVENOUS | Status: DC
  Administered 2013-08-23: 1000 mL via INTRAVENOUS

## 2013-08-23 MED ORDER — OXYCODONE-ACETAMINOPHEN 5-325 MG PO TABS
1.0000 | ORAL_TABLET | ORAL | Status: DC | PRN
  Administered 2013-08-24 – 2013-08-26 (×6): 1 via ORAL
  Filled 2013-08-23 (×6): qty 1

## 2013-08-23 NOTE — ED Notes (Signed)
Pt requested to cut the pants off instead of taking off because of hip pain.

## 2013-08-23 NOTE — ED Provider Notes (Addendum)
CSN: [id number]     Arrival date & time 08/23/13  2024 History   First MD Initiated Contact with Patient 08/23/13 2053     Chief Complaint  Patient presents with  . Hip Pain     (Consider location/radiation/quality/duration/timing/severity/associated sxs/prior Treatment) HPI  80 year old female states she lives at independent living with her husband at baseline uses a walker states she fell within the last few weeks and was hospitalized with pain in her hips and pelvis was able to walk again at discharge but tonight tripped over her walker and hurt her pelvis and both hips again and cannot walk again due to pain in both hips and her tailbone area she also bumped her head and has some mild headache and neck pain tonight as well but no amnesia no confusion no focal or lateralizing weakness or numbness in her fall occurred just prior to arrival the pain is severe worse with movement and palpation and better she stays still she is no chest pain no shortness breath no abdominal pain. Past Medical History  Diagnosis Date  . PE (pulmonary embolism)   . Subarachnoid hemorrhage   . Spastic hemiplegia affecting nondominant side   . Intracerebral hemorrhage   . Hypertension   . Arthritis   . Stroke 3/12  . Gastrointestinal problem   . Depression   . Breast CA     mastectomy  . Colon cancer   . Tremor     benign essential  . Rheumatoid arthritis(714.0)   . Hypothyroidism   . Dysrhythmia     palpitation  . Lymphedema of arm     right  . Atrophic vaginitis   . Osteoporosis   . Multinodular goiter   . Menieres disease 2002  . Thrombocytopenia 2002  . Gastroparesis 7/03  . Colon cancer 2007  . Pulmonary embolism 2003    chronic coumadin  . CVA (cerebral vascular accident)    Past Surgical History  Procedure Laterality Date  . Fracture surgery  2010    rt wrist  . Mastectomy      bilat mastectomies  . Breast surgery    . Joint replacement      Bilateral total hip replacement  .  Hardware removal  03/29/2012    Procedure: HARDWARE REMOVAL;  Surgeon: Alta Corning, MD;  Location: [facility];  Service: Orthopedics;  Laterality: Left;  Screw Removal Left Foot  . Shoulder arthroscopy Right   . Thyroidectomy    . Total hip arthroplasty    . Cholecystectomy    . Tonsillectomy    . Left ankle      ruptured achilles tendon  . Carpal tunnel release Bilateral   . Thoracic outlet surgery Bilateral   . Ulnar nerve transposition Right   . Knee surgery Bilateral   . Lumbar laminectomy    . Nasal sinus surgery    . Cervical laminectomy    . Bunionectomy Right   . Right wrist    . Squamous cell carcinoma resec     Family History  Problem Relation Age of Onset  . Cancer Father   . Heart failure Sister   . Tremor Maternal Grandmother   . Tremor Maternal Grandfather   . Tremor Paternal Grandmother   . Tremor Paternal Grandfather    History  Substance Use Topics  . Smoking status: Former Smoker    Quit date: 02/25/1970  . Smokeless tobacco: Never Used  . Alcohol Use: 0.0 oz/week     Comment: occ  wine   OB History   Grav Para Term Preterm Abortions TAB SAB Ect Mult Living   4 4   0     4     Review of Systems 10 Systems reviewed and are negative for acute change except as noted in the HPI.   Allergies  Demerol; Fentanyl; Metoclopramide hcl; Morphine and related; Nsaids; Pentazocine; Pentazocine lactate; and Versed  Home Medications   Prior to Admission medications   Medication Sig Start Date End Date Taking? Authorizing Provider  aspirin 81 MG tablet Take 81 mg by mouth daily.    Historical Provider, MD  atenolol (TENORMIN) 25 MG tablet Take 25 mg by mouth daily.    Historical Provider, MD  calcium carbonate (TUMS - DOSED IN MG ELEMENTAL CALCIUM) 500 MG chewable tablet Chew 1 tablet by mouth 4 (four) times daily.    Historical Provider, MD  Carboxymethylcellul-Glycerin (REFRESH OPTIVE OP) Apply 1-2 drops to eye.     Historical Provider, MD   carboxymethylcellulose (REFRESH) 1 % ophthalmic solution Apply 1-2 drops to eye at bedtime.     Historical Provider, MD  Cholecalciferol (VITAMIN D) 2000 UNITS CAPS Take 1 capsule by mouth daily.    Historical Provider, MD  colestipol (COLESTID) 1 G tablet Take 2 g by mouth 3 (three) times daily with meals.    Historical Provider, MD  HYDROmorphone (DILAUDID) 2 MG tablet Take 1 tablet (2 mg total) by mouth every 6 (six) hours as needed. 08/18/13   Annita Brod, MD  hydroxychloroquine (PLAQUENIL) 200 MG tablet Take 200 mg by mouth 2 (two) times daily with a meal.    Historical Provider, MD  LACTASE PO Take 2 tablets by mouth 3 (three) times daily with meals.     Historical Provider, MD  levothyroxine (SYNTHROID, LEVOTHROID) 200 MCG tablet Take 400 mcg by mouth daily before breakfast.     Historical Provider, MD  loperamide (IMODIUM) 2 MG capsule Take by mouth as needed for diarrhea or loose stools.    Historical Provider, MD  LORazepam (ATIVAN) 0.5 MG tablet Take 1 mg by mouth 2 (two) times daily.     Historical Provider, MD  Loma Boston (OYSTER CALCIUM) 500 MG TABS tablet Take 500 mg of elemental calcium by mouth daily.     Historical Provider, MD  predniSONE (DELTASONE) 5 MG tablet Take 5 mg by mouth daily with breakfast.  03/25/13   Historical Provider, MD  primidone (MYSOLINE) 250 MG tablet Take 125 mg by mouth 2 (two) times daily.  06/19/13   Kathrynn Ducking, MD  primidone (MYSOLINE) 50 MG tablet Take 1 tablet (50 mg total) by mouth at bedtime. 07/31/13   Kathrynn Ducking, MD  sertraline (ZOLOFT) 100 MG tablet Take 100 mg by mouth daily.     Historical Provider, MD  vitamin C (ASCORBIC ACID) 500 MG tablet Take 500 mg by mouth daily.    Historical Provider, MD   BP 118/54  Pulse 61  Temp(Src) 98.2 F (36.8 C) (Oral)  Resp 18  Ht 5\' 5"  (1.651 m)  Wt 120 lb (54.432 kg)  BMI 19.97 kg/m2  SpO2 97%  LMP 06/08/1986 Physical Exam  Nursing note and vitals reviewed. Constitutional: She is  oriented to person, place, and time.  Awake, alert, nontoxic appearance.  HENT:  Head: Atraumatic.  Eyes: Right eye exhibits no discharge. Left eye exhibits no discharge.  Neck: Neck supple.  Cervical spine minimal diffuse tenderness midline and paracervical  Cardiovascular: Normal rate and regular  rhythm.   No murmur heard. Pulmonary/Chest: Effort normal and breath sounds normal. No respiratory distress. She has no wheezes. She has no rales. She exhibits no tenderness.  Abdominal: Soft. Bowel sounds are normal. She exhibits no distension. There is no tenderness. There is no rebound and no guarding.  Musculoskeletal: She exhibits tenderness. She exhibits no edema.  Baseline ROM, no obvious new focal weakness. Back nontender except over the lower sacrum and coccyx region with both hips tender with decreased range of motion both hips to the pain in both knees ankles and feet are nontender with dorsalis pedis pulses intact in both feet with normal light touch good movement of both feet and capillary refill less than 2 seconds both feet both arms are nontender with good movement  Neurological: She is alert and oriented to person, place, and time.  Mental status and motor strength appears baseline for patient and situation.  Skin: No rash noted.  Psychiatric: She has a normal mood and affect.    ED Course  Procedures (including critical care time) Patient / Family / Caregiver informed of clinical course, understand medical decision-making process, and agree with plan.Pt stable in ED with no significant deterioration in condition.Unable to walk so d/w [facility] for admit. Labs Review Labs Reviewed  BASIC METABOLIC PANEL - Abnormal; Notable for the following:    Sodium 132 (*)    Chloride 94 (*)    Glucose, Bld 113 (*)    GFR calc non Af Amer 84 (*)    All other components within normal limits  CBC WITH DIFFERENTIAL - Abnormal; Notable for the following:    Platelets 142 (*)    Neutrophils  Relative % 78 (*)    Lymphocytes Relative 11 (*)    All other components within normal limits  BASIC METABOLIC PANEL - Abnormal; Notable for the following:    Potassium 3.3 (*)    Creatinine, Ser 0.46 (*)    Calcium 6.2 (*)    All other components within normal limits  CBC - Abnormal; Notable for the following:    RBC 2.89 (*)    Hemoglobin 9.4 (*)    HCT 28.3 (*)    Platelets 111 (*)    All other components within normal limits  TSH - Abnormal; Notable for the following:    TSH 29.160 (*)    All other components within normal limits  BASIC METABOLIC PANEL - Abnormal; Notable for the following:    Glucose, Bld 117 (*)    GFR calc non Af Amer 87 (*)    All other components within normal limits  PROTIME-INR  CBC  TYPE AND SCREEN    Imaging Review Dg Chest 1 View  08/23/2013   CLINICAL DATA:  Recent fall.  Hip pain.  EXAM: CHEST - 1 VIEW  COMPARISON:  07/27/2010  FINDINGS: Mild cardiac enlargement. Calcification in the mitral valve annulus. Pulmonary vascularity is normal. eribronchial thickening suggesting chronic bronchitis. No focal airspace disease or consolidation in the lungs. Surgical clips in the right axilla. Surgical clips in the base the neck. Similar appears to previous study.  IMPRESSION: No active disease.   Electronically Signed   By: Lucienne Capers M.D.   On: 08/23/2013  Dg Hip Complete Right  08/23/2013   CLINICAL DATA:  Recent fall.  Bilateral hip pain.  EXAM: RIGHT HIP - COMPLETE 2+ VIEW  COMPARISON:  Acute abdominal series 07/27/2010  FINDINGS: Bilateral hip replacements are noted. There is left protrusio acetabula I. No hardware complicating  feature otherwise. No fracture, subluxation or dislocation.  IMPRESSION: No acute bony abnormality.   Electronically Signed   By: Rolm Baptise M.D.   On: 08/23/2013 22:39   Ct Head Wo Contrast  08/23/2013   CLINICAL DATA:  Fall.  EXAM: CT HEAD WITHOUT CONTRAST  CT CERVICAL SPINE WITHOUT CONTRAST  TECHNIQUE: Contiguous axial  images were obtained from the base of the skull through the vertex without intravenous contrast. Routine helical imaging was performed through the cervical spine without intravenous contrast. Sagittal and coronal reconstructed images were performed and reviewed.  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 Extensive chronic microvascular changes throughout the deep white matter. No acute infarction. No hemorrhage or hydrocephalus. No extra-axial fluid collection. No acute calvarial abnormality. Visualized paranasal sinuses and mastoids clear. Orbital soft tissues unremarkable.  CT cervical:  Diffuse degenerative disc and facet disease. Solid bony fusion from C5-C7. Prevertebral soft tissues are normal. Alignment is normal. No fracture. No epidural or paraspinal hematoma.  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  Atrophy, chronic microvascular disease.  No acute intracranial abnormality.  No acute bony abnormality within the cervical spine.   Electronically Signed   By: Rolm Baptise M.D.   On: 08/23/2013 22:21   Ct Head Wo Contrast  08/17/2013   CLINICAL DATA:  Status post fall 2 days ago. History of previous intracranial hemorrhage.  EXAM: CT HEAD WITHOUT CONTRAST  CT CERVICAL SPINE WITHOUT CONTRAST  TECHNIQUE: Multidetector CT imaging of the head and cervical spine was performed following the standard protocol without intravenous contrast. Multiplanar CT image reconstructions of the cervical spine were also generated.  COMPARISON:  CT HEAD W/O CM dated 07/28/2010  FINDINGS: CT HEAD FINDINGS  There is mild diffuse cerebral and cerebellar atrophy with compensatory ventriculomegaly. There is encephalomalacia in the right frontal lobe. There is decreased density in the deep white matter of both cerebral hemispheres consistent with chronic small vessel ischemic type change. The cerebellum and brainstem exhibit normal density.  There are no abnormal intracranial calcifications.  At bone window settings the observed portions of the paranasal sinuses are clear. There is a small amount of fluid within the left mastoid air cells. There is no evidence of an acute skull fracture.  CT CERVICAL SPINE FINDINGS  The cervical vertebral bodies are preserved in height. 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 There is mild degenerative change at C4-5 and C7-T1. There is no evidence of a perched facet. There is mild facet joint hypertrophy at multiple levels. The odontoid is intact. The lateral masses of C1 align normally with those of C2. There is degenerative change of the atlanto-dens articulation and of the articulation of the lateral masses of C1 with C2. The bony ring at each cervical level is intact. There is no acute fracture of the observed portions of the first and second ribs. Old deformities of the first ribs. The pulmonary apices exhibit minimal fibrotic change. The soft tissues of the neck exhibit no acute abnormalities. There is dense calcification in the carotid arterial system.  IMPRESSION: 1. There are chronic changes within the brain as described consistent with previous ischemic insult in the right frontal lobe as well as chronic small vessel ischemic change diffusely. There is no acute intracranial hemorrhage. 2. There is no evidence of an acute skull fracture. 3. There is no evidence of an acute cervical spine fracture nor dislocation. There is moderate degenerative  change of the discs and facet joints as well as congenital fusion across the C5-6 and C6-7 disc levels.   Electronically Signed   By: David  Martinique   On: 08/17/2013 15:49   Ct Cervical Spine Wo Contrast  08/23/2013   CLINICAL DATA:  Fall.  EXAM: CT HEAD WITHOUT CONTRAST  CT CERVICAL SPINE WITHOUT CONTRAST  TECHNIQUE: Contiguous axial images were obtained from the base of the skull through the vertex without intravenous contrast. Routine  helical imaging was performed through the cervical spine without intravenous contrast. Sagittal and coronal reconstructed images were performed and reviewed.  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 Extensive chronic microvascular changes throughout the deep white matter. No acute infarction. No hemorrhage or hydrocephalus. No extra-axial fluid collection. No acute calvarial abnormality. Visualized paranasal sinuses and mastoids clear. Orbital soft tissues unremarkable.  CT cervical:  Diffuse degenerative disc and facet disease. Solid bony fusion from C5-C7. Prevertebral soft tissues are normal. Alignment is normal. No fracture. No epidural or paraspinal hematoma.  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  Atrophy, chronic microvascular disease.  No acute intracranial abnormality.  No acute bony abnormality within the cervical spine.   Electronically Signed   By: Rolm Baptise M.D.   On: 08/23/2013 22:21   Ct Cervical Spine Wo Contrast  08/17/2013   CLINICAL DATA:  Status post fall 2 days ago. History of previous intracranial hemorrhage.  EXAM: CT HEAD WITHOUT CONTRAST  CT CERVICAL SPINE WITHOUT CONTRAST  TECHNIQUE: Multidetector CT imaging of the head and cervical spine was performed following the standard protocol without intravenous contrast. Multiplanar CT image reconstructions of the cervical spine were also generated.  COMPARISON:  CT HEAD W/O CM dated 07/28/2010  FINDINGS: CT HEAD FINDINGS  There is mild diffuse cerebral and cerebellar atrophy with compensatory ventriculomegaly. There is encephalomalacia in the right frontal lobe. There is decreased density in the deep white matter of both cerebral hemispheres consistent with chronic small vessel ischemic type change. The cerebellum and brainstem exhibit normal density. There are no abnormal intracranial calcifications.  At bone window settings the observed portions  of the paranasal sinuses are clear. There is a small amount of fluid within the left mastoid air cells. There is no evidence of an acute skull fracture.  CT CERVICAL SPINE FINDINGS  The cervical vertebral bodies are preserved in height. 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 There is mild degenerative change at C4-5 and C7-T1. There is no evidence of a perched facet. There is mild facet joint hypertrophy at multiple levels. The odontoid is intact. The lateral masses of C1 align normally with those of C2. There is degenerative change of the atlanto-dens articulation and of the articulation of the lateral masses of C1 with C2. The bony ring at each cervical level is intact. There is no acute fracture of the observed portions of the first and second ribs. Old deformities of the first ribs. The pulmonary apices exhibit minimal fibrotic change. The soft tissues of the neck exhibit no acute abnormalities. There is dense calcification in the carotid arterial system.  IMPRESSION: 1. There are chronic changes within the brain as described consistent with previous ischemic insult in the right frontal lobe as well as chronic small vessel ischemic change diffusely. There is no acute intracranial hemorrhage. 2. There is no evidence of an acute skull fracture. 3. There is no evidence of an acute cervical spine  fracture nor dislocation. 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   Electronically Signed   By: David  Martinique   On: 08/17/2013 15:49   Mr Brain Wo Contrast  08/17/2013   CLINICAL DATA:  History of breast cancer and stroke. Fall. Numbness or weakness left side. Question new stroke.  EXAM: MRI HEAD WITHOUT CONTRAST  TECHNIQUE: Multiplanar, multiecho pulse sequences of the brain and surrounding structures were obtained without intravenous contrast.  COMPARISON:  08/17/2013 CT.  11/23/2010 MR.  FINDINGS:  No acute infarct.  Moderate size remote anterior right frontal lobe infarct with encephalomalacia with associated blood breakdown products.  Scattered blood breakdown products in the temporal lobes bilaterally and less so parietal lobes. This may reflect result of prior hemorrhagic ischemia. Cannot completely exclude trauma contributing to scattered blood breakdown products (such as shear injuries) although felt to be less likely consideration. No evidence of extra-axial blood collection.  Moderate small vessel disease type changes.  Global atrophy. Ventricular prominence may be related to central atrophy although difficult to completely mild component of hydrocephalus. Ventricles are slightly more prominent than on the prior MR although atrophy has also progressed.  No intracranial mass lesion noted on this unenhanced exam.  Partial opacification mastoid air cells bilaterally. No fracture noted on accompanying CT. No obstructing lesion posterior superior nasopharynx causing eustachian tube dysfunction.  Major intracranial vascular structures are patent.  Transverse ligament hypertrophy. Cervical medullary junction, pituitary region, pineal region and orbital structures unremarkable.  IMPRESSION: No acute infarct.  Moderate size remote anterior right frontal lobe infarct.  Scattered blood breakdown products in the temporal lobes bilaterally and less so parietal lobes may reflect result of prior hemorrhagic ischemia. Cannot completely exclude trauma contributing to scattered blood breakdown products (such as shear injuries) although felt to be less likely consideration. No evidence of extra-axial blood collection.  Moderate small vessel disease type changes.  Global atrophy. Ventricular prominence may be related to central atrophy although difficult to completely mild component of hydrocephalus.  No intracranial mass lesion noted on this unenhanced exam.  Partial opacification mastoid air cells bilaterally    Electronically Signed   By: Chauncey Cruel M.D.   On: 08/17/2013 19:12   Mr Hip Left Wo Contrast  08/17/2013   CLINICAL DATA:  Left hip pain status post fall. Possible left pelvic fracture on radiographs. History of bilateral total hip arthroplasty.  EXAM: MRI OF THE LEFT HIP WITHOUT CONTRAST  TECHNIQUE: Multiplanar, multisequence MR imaging was performed. No intravenous contrast was administered.  COMPARISON:  DG HIP COMPLETE*L* dated 08/17/2013; CT PELVIS W/CM dated 05/28/2008  FINDINGS: Unfortunately, artifact from the bilateral total hip arthroplasties partially obscures the pubic rami bilaterally. T1 weighted images confirm the presence of nondisplaced fractures of the left superior and inferior pubic rami. There is some marrow edema in the inferior pubic ramus fracture on the T2 weighted images. The right pubic rami, sacrum, symphysis pubis and sacroiliac joints appear intact. There is no evidence of proximal femur fracture or dislocation.  There is some edema within the left proximal thigh adductor musculature. In addition, there is a edema and a probable focal hematoma inferiorly in the left gluteus maximus muscle. There is generalized subcutaneous edema in the pelvis and proximal thighs.  IMPRESSION: 1. Nondisplaced fractures of the left superior and inferior pubic rami. Although dating is limited by the artifact from the total hip arthroplasties, the adjacent soft tissue edema  implies acuity. 2. Presumed focal hematoma in the left gluteus maximus muscle posterior to the ischium.   Electronically Signed   By: Camie Patience M.D.   On: 08/17/2013 19:38   EKG Interpretation None      MDM   Final diagnoses:  Pelvic fracture  Fall    The patient appears reasonably stabilized for admission considering the current resources, flow, and capabilities available in the ED at this time, and I doubt any other [facility] requiring further screening and/or treatment in the ED prior to admission.    Babette Relic,  MD 08/28/13 2132  Babette Relic, MD 08/28/13 2133

## 2013-08-23 NOTE — ED Notes (Signed)
Pt to ED via EMS with from [facility] skill nursing facility c/o increased left hip pain. Pt had left hip fracture on 08/17/2013 and discharged on 08/19/2013. Pt states she has been having increased pain and prescribed med not working. Per EMS, BP-124/70, pulse-70. RR-18, pain 8/10. Pt alerts and oriented x3 at arrival.

## 2013-08-24 DIAGNOSIS — S329XXA Fracture of unspecified parts of lumbosacral spine and pelvis, initial encounter for closed fracture: Secondary | ICD-10-CM

## 2013-08-24 DIAGNOSIS — R269 Unspecified abnormalities of gait and mobility: Secondary | ICD-10-CM

## 2013-08-24 DIAGNOSIS — E039 Hypothyroidism, unspecified: Secondary | ICD-10-CM

## 2013-08-24 DIAGNOSIS — R52 Pain, unspecified: Secondary | ICD-10-CM

## 2013-08-24 DIAGNOSIS — M069 Rheumatoid arthritis, unspecified: Secondary | ICD-10-CM

## 2013-08-24 DIAGNOSIS — I10 Essential (primary) hypertension: Secondary | ICD-10-CM

## 2013-08-24 LAB — CBC
HCT: 28.3 % — ABNORMAL LOW (ref 36.0–46.0)
Hemoglobin: 9.4 g/dL — ABNORMAL LOW (ref 12.0–15.0)
MCH: 32.5 pg (ref 26.0–34.0)
MCHC: 33.2 g/dL (ref 30.0–36.0)
MCV: 97.9 fL (ref 78.0–100.0)
Platelets: 111 10*3/uL — ABNORMAL LOW (ref 150–400)
RBC: 2.89 MIL/uL — AB (ref 3.87–5.11)
RDW: 13.2 % (ref 11.5–15.5)
WBC: 4.5 10*3/uL (ref 4.0–10.5)

## 2013-08-24 LAB — BASIC METABOLIC PANEL
BUN: 12 mg/dL (ref 6–23)
CO2: 19 meq/L (ref 19–32)
Calcium: 6.2 mg/dL — CL (ref 8.4–10.5)
Chloride: 111 mEq/L (ref 96–112)
Creatinine, Ser: 0.46 mg/dL — ABNORMAL LOW (ref 0.50–1.10)
GFR calc Af Amer: >90 mL/min (ref >90)
GFR calc non Af Amer: >90 mL/min (ref >90)
GLUCOSE: 81 mg/dL (ref 70–99)
POTASSIUM: 3.3 meq/L — AB (ref 3.7–5.3)
SODIUM: 141 meq/L (ref 137–147)

## 2013-08-24 LAB — TSH: TSH: 29.16 u[IU]/mL — AB (ref 0.350–4.500)

## 2013-08-24 MED ORDER — ACETAMINOPHEN 325 MG PO TABS: 650.0000 mg | ORAL_TABLET | Freq: Four times a day (QID) | ORAL | Status: DC | PRN

## 2013-08-24 MED ORDER — PRIMIDONE 50 MG PO TABS: 50.0000 mg | ORAL_TABLET | Freq: Every day | ORAL | Status: DC

## 2013-08-24 MED ORDER — ONDANSETRON HCL 4 MG PO TABS: 4.0000 mg | ORAL_TABLET | Freq: Four times a day (QID) | ORAL | Status: DC | PRN

## 2013-08-24 MED ORDER — POTASSIUM CHLORIDE CRYS ER 20 MEQ PO TBCR
40.0000 meq | EXTENDED_RELEASE_TABLET | ORAL | Status: AC
  Administered 2013-08-24 (×2): 40 meq via ORAL
  Filled 2013-08-24: qty 2

## 2013-08-24 MED ORDER — ACETAMINOPHEN 650 MG RE SUPP
650.0000 mg | Freq: Four times a day (QID) | RECTAL | Status: DC | PRN
Start: 2013-08-24 — End: 2013-08-26

## 2013-08-24 MED ORDER — SENNOSIDES-DOCUSATE SODIUM 8.6-50 MG PO TABS
1.0000 | ORAL_TABLET | Freq: Two times a day (BID) | ORAL | Status: DC
  Administered 2013-08-24 – 2013-08-26 (×4): 1 via ORAL
  Filled 2013-08-24 (×3): qty 1

## 2013-08-24 MED ORDER — ATENOLOL 25 MG PO TABS
25.0000 mg | ORAL_TABLET | Freq: Every day | ORAL | Status: DC
  Administered 2013-08-24 – 2013-08-26 (×3): 25 mg via ORAL
  Filled 2013-08-24 (×3): qty 1

## 2013-08-24 MED ORDER — ONDANSETRON HCL 4 MG/2ML IJ SOLN
4.0000 mg | Freq: Four times a day (QID) | INTRAMUSCULAR | Status: DC | PRN
  Filled 2013-08-24: qty 2

## 2013-08-24 MED ORDER — CALCIUM CARBONATE 1250 (500 CA) MG PO TABS
1.0000 | ORAL_TABLET | Freq: Every day | ORAL | Status: DC
  Administered 2013-08-24 – 2013-08-26 (×3): 500 mg via ORAL
  Filled 2013-08-24 (×4): qty 1

## 2013-08-24 MED ORDER — LACTASE 3000 UNITS PO TABS
3000.0000 [IU] | ORAL_TABLET | Freq: Three times a day (TID) | ORAL | Status: DC
  Administered 2013-08-24 – 2013-08-25 (×5): 3000 [IU] via ORAL
  Filled 2013-08-24 (×12): qty 1

## 2013-08-24 MED ORDER — CALCIUM CARBONATE ANTACID 500 MG PO CHEW
1.0000 | CHEWABLE_TABLET | Freq: Three times a day (TID) | ORAL | Status: DC
  Administered 2013-08-24 – 2013-08-26 (×9): 200 mg via ORAL
  Filled 2013-08-24 (×13): qty 1

## 2013-08-24 MED ORDER — VITAMIN D 1000 UNITS PO TABS
2000.0000 [IU] | ORAL_TABLET | Freq: Every day | ORAL | Status: DC
  Administered 2013-08-24 – 2013-08-26 (×3): 2000 [IU] via ORAL
  Filled 2013-08-24 (×3): qty 2

## 2013-08-24 MED ORDER — LORAZEPAM 0.5 MG PO TABS
0.5000 mg | ORAL_TABLET | Freq: Two times a day (BID) | ORAL | Status: DC
  Administered 2013-08-24 – 2013-08-26 (×3): 0.5 mg via ORAL
  Filled 2013-08-24 (×2): qty 1

## 2013-08-24 MED ORDER — LOPERAMIDE HCL 2 MG PO CAPS
2.0000 mg | ORAL_CAPSULE | Freq: Four times a day (QID) | ORAL | Status: DC | PRN
  Filled 2013-08-24: qty 1

## 2013-08-24 MED ORDER — PREDNISONE 5 MG PO TABS
5.0000 mg | ORAL_TABLET | Freq: Every day | ORAL | Status: DC
  Administered 2013-08-24 – 2013-08-26 (×3): 5 mg via ORAL
  Filled 2013-08-24 (×4): qty 1

## 2013-08-24 MED ORDER — MAGNESIUM HYDROXIDE 400 MG/5ML PO SUSP
30.0000 mL | Freq: Every day | ORAL | Status: DC | PRN
  Administered 2013-08-24: 30 mL via ORAL
  Filled 2013-08-24: qty 30

## 2013-08-24 MED ORDER — POLYVINYL ALCOHOL 1.4 % OP SOLN
2.0000 [drp] | Freq: Every day | OPHTHALMIC | Status: DC
  Administered 2013-08-24 – 2013-08-25 (×2): 2 [drp] via OPHTHALMIC
  Filled 2013-08-24: qty 15

## 2013-08-24 MED ORDER — OYSTER CALCIUM 500 MG PO TABS: 500.0000 mg | ORAL_TABLET | Freq: Every day | ORAL | Status: DC

## 2013-08-24 MED ORDER — OXYCODONE HCL 5 MG PO TABS
5.0000 mg | ORAL_TABLET | ORAL | Status: DC | PRN
  Administered 2013-08-24 – 2013-08-26 (×5): 5 mg via ORAL
  Filled 2013-08-24 (×5): qty 1

## 2013-08-24 MED ORDER — LEVOTHYROXINE SODIUM 200 MCG PO TABS
400.0000 ug | ORAL_TABLET | Freq: Every day | ORAL | Status: DC
  Administered 2013-08-24 – 2013-08-26 (×3): 400 ug via ORAL
  Filled 2013-08-24 (×4): qty 2

## 2013-08-24 MED ORDER — HYDROMORPHONE HCL PF 1 MG/ML IJ SOLN: 0.5000 mg | INTRAMUSCULAR | Status: DC | PRN

## 2013-08-24 MED ORDER — VITAMIN C 500 MG PO TABS
500.0000 mg | ORAL_TABLET | Freq: Every day | ORAL | Status: DC
  Administered 2013-08-24 – 2013-08-26 (×3): 500 mg via ORAL
  Filled 2013-08-24 (×3): qty 1

## 2013-08-24 MED ORDER — PRIMIDONE 50 MG PO TABS
175.0000 mg | ORAL_TABLET | Freq: Every day | ORAL | Status: DC
  Administered 2013-08-24: 20:00:00 via ORAL
  Administered 2013-08-25: 175 mg via ORAL
  Filled 2013-08-24 (×4): qty 1

## 2013-08-24 MED ORDER — LORAZEPAM 1 MG PO TABS
1.0000 mg | ORAL_TABLET | Freq: Two times a day (BID) | ORAL | Status: DC
  Administered 2013-08-24 (×2): 1 mg via ORAL
  Filled 2013-08-24 (×2): qty 1

## 2013-08-24 MED ORDER — ENOXAPARIN SODIUM 30 MG/0.3ML ~~LOC~~ SOLN
30.0000 mg | SUBCUTANEOUS | Status: DC
  Administered 2013-08-24 – 2013-08-26 (×3): 30 mg via SUBCUTANEOUS
  Filled 2013-08-24 (×3): qty 0.3

## 2013-08-24 MED ORDER — CARBOXYMETHYLCELLULOSE SODIUM 1 % OP SOLN: 2.0000 [drp] | Freq: Every day | OPHTHALMIC | Status: DC

## 2013-08-24 MED ORDER — HYDROXYCHLOROQUINE SULFATE 200 MG PO TABS
200.0000 mg | ORAL_TABLET | Freq: Two times a day (BID) | ORAL | Status: DC
  Administered 2013-08-24 – 2013-08-26 (×4): 200 mg via ORAL
  Filled 2013-08-24 (×7): qty 1

## 2013-08-24 MED ORDER — SERTRALINE HCL 100 MG PO TABS
100.0000 mg | ORAL_TABLET | Freq: Every day | ORAL | Status: DC
  Administered 2013-08-24 – 2013-08-26 (×3): 100 mg via ORAL
  Filled 2013-08-24 (×3): qty 1

## 2013-08-24 MED ORDER — PRIMIDONE 250 MG PO TABS
125.0000 mg | ORAL_TABLET | Freq: Every day | ORAL | Status: DC
  Administered 2013-08-24 – 2013-08-26 (×3): 125 mg via ORAL
  Filled 2013-08-24 (×3): qty 1

## 2013-08-24 MED ORDER — SODIUM CHLORIDE 0.9 % IV SOLN
INTRAVENOUS | Status: DC
  Administered 2013-08-24: 08:00:00 via INTRAVENOUS

## 2013-08-24 MED ORDER — COLESTIPOL HCL 1 G PO TABS
2.0000 g | ORAL_TABLET | Freq: Three times a day (TID) | ORAL | Status: DC
  Administered 2013-08-24 – 2013-08-26 (×8): 2 g via ORAL
  Filled 2013-08-24 (×10): qty 2

## 2013-08-24 MED ORDER — ASPIRIN EC 81 MG PO TBEC
81.0000 mg | DELAYED_RELEASE_TABLET | Freq: Every day | ORAL | Status: DC
  Administered 2013-08-24 – 2013-08-26 (×3): 81 mg via ORAL
  Filled 2013-08-24 (×3): qty 1

## 2013-08-24 NOTE — Progress Notes (Addendum)
Clinical Social Work Department CLINICAL SOCIAL WORK PLACEMENT NOTE 08/24/2013  Patient:  [name], [name]  Account Number:  [id number] Admit date:  08/23/2013  Clinical Social Worker:  Blima Rich, Latanya Presser  Date/time:  08/24/2013 05:39 PM  Clinical Social Work is seeking post-discharge placement for this patient at the following level of care:   SKILLED NURSING   (*CSW will update this form in Epic as items are completed)   08/24/2013  Patient/family provided with [facility] Department of Clinical Social Work's list of facilities offering this level of care within the geographic area requested by the patient (or if unable, by the patient's family).  08/24/2013  Patient/family informed of their freedom to choose among providers that offer the needed level of care, that participate in Medicare, Medicaid or managed care program needed by the patient, have an available bed and are willing to accept the patient.  08/24/2013  Patient/family informed of [facility]' ownership interest in [facility], as well as of the fact that they are under no obligation to receive care at this facility.  PASARR submitted to EDS on 08/24/2013 PASARR number received from EDS on 08/24/2013  FL2 transmitted to all facilities in geographic area requested by pt/family on  08/24/2013 FL2 transmitted to all facilities within larger geographic area on   Patient informed that his/her managed care company has contracts with or will negotiate with  certain facilities, including the following:     Patient/family informed of bed offers received:  08/25/2013 Patient chooses bed at [facility] Physician recommends and patient chooses bed at    Patient to be transferred to   [facility]  08/26/2013 Patient to be transferred to facility by [facility]  The following physician request were entered in Epic:   Additional Comments:

## 2013-08-24 NOTE — Progress Notes (Signed)
Patient admitted after midnight. Chart reviewed.  Patient examined.  Son worried about disc herniation due to shooting pain down left leg.  Will get MRI L spine.  Needs SNF. Await PT eval.  Doree Barthel, M.D. [facility]

## 2013-08-24 NOTE — H&P (Signed)
[facility] History and Physical  [name] SWF:[id number] DOB: 1933/11/25 DOA: 08/23/2013  Referring physician: EDP PCP: MAZZOCCHI, Reggy Eye, MD  Specialists:   Chief Complaint: Increased Pain from Pelvic Fracture  HPI: [name] is a 80 y.o. female who suffered a pelvic fracture from a fall 1 week ago who was discharged to the [facility] living facility who was sent to the ED due to intactible pain and difficulty walking.   She has not been able to walk or bear weight, and reports increased pain which has not been controlled with her pain rx.     Review of Systems:  Constitutional: No Weight Loss, No Weight Gain, Night Sweats, Fevers, Chills, Fatigue, or +Generalized Weakness HEENT: No Headaches, Difficulty Swallowing,Tooth/Dental Problems,Sore Throat,  No Sneezing, Rhinitis, Ear Ache, Nasal Congestion, or Post Nasal Drip,  Cardio-vascular:  No Chest pain, Orthopnea, PND, Edema in lower extremities, Anasarca, Dizziness, Palpitations  Resp: No Dyspnea, No DOE, No Cough, No Hemoptysis, No Wheezing.    GI: No Heartburn, Indigestion, Abdominal Pain, Nausea, Vomiting, Diarrhea, Change in Bowel Habits,  Loss of Appetite  GU: No Dysuria, Change in Color of Urine, No Urgency or Frequency.  No flank pain.  Musculoskeletal:  +Joint Pain or Swelling.  No Decreased Range of Motion. No Back Pain.  Neurologic: No Syncope, No Seizures, +Muscle Weakness, Paresthesia, Vision Disturbance or Loss, No Diplopia, No Vertigo, +Difficulty Walking,  Skin: No Rash or Lesions. Psych: No Change in Mood or Affect. No Depression or Anxiety. No Memory loss. No Confusion or Hallucinations   Past Medical History  Diagnosis Date  . PE (pulmonary embolism)   . Subarachnoid hemorrhage   . Spastic hemiplegia affecting nondominant side   . Intracerebral hemorrhage   . Hypertension   . Arthritis   . Stroke 3/12  . Gastrointestinal problem   . Depression   . Breast CA     mastectomy  .  Colon cancer   . Tremor     benign essential  . Rheumatoid arthritis(714.0)   . Hypothyroidism   . Dysrhythmia     palpitation  . Lymphedema of arm     right  . Atrophic vaginitis   . Osteoporosis   . Multinodular goiter   . Menieres disease 2002  . Thrombocytopenia 2002  . Gastroparesis 7/03  . Colon cancer 2007  . Pulmonary embolism 2003    chronic coumadin  . CVA (cerebral vascular accident)      Past Surgical History  Procedure Laterality Date  . Fracture surgery  2010    rt wrist  . Mastectomy      bilat mastectomies  . Breast surgery    . Joint replacement      Bilateral total hip replacement  . Hardware removal  03/29/2012    Procedure: HARDWARE REMOVAL;  Surgeon: Alta Corning, MD;  Location: [facility];  Service: Orthopedics;  Laterality: Left;  Screw Removal Left Foot  . Shoulder arthroscopy Right   . Thyroidectomy    . Total hip arthroplasty    . Cholecystectomy    . Tonsillectomy    . Left ankle      ruptured achilles tendon  . Carpal tunnel release Bilateral   . Thoracic outlet surgery Bilateral   . Ulnar nerve transposition Right   . Knee surgery Bilateral   . Lumbar laminectomy    . Nasal sinus surgery    . Cervical laminectomy    . Bunionectomy Right   . Right  wrist    . Squamous cell carcinoma resec        Prior to Admission medications   Medication Sig Start Date End Date Taking? Authorizing Provider  aspirin 81 MG tablet Take 81 mg by mouth daily.   Yes Historical Provider, MD  atenolol (TENORMIN) 25 MG tablet Take 25 mg by mouth daily.   Yes Historical Provider, MD  calcium carbonate (TUMS - DOSED IN MG ELEMENTAL CALCIUM) 500 MG chewable tablet Chew 1 tablet by mouth 4 (four) times daily.   Yes Historical Provider, MD  Carboxymethylcellul-Glycerin (REFRESH OPTIVE OP) Apply 1-2 drops to eye.    Yes Historical Provider, MD  carboxymethylcellulose (REFRESH) 1 % ophthalmic solution Apply 1-2 drops to eye at bedtime.    Yes  Historical Provider, MD  Cholecalciferol (VITAMIN D) 2000 UNITS CAPS Take 1 capsule by mouth daily.   Yes Historical Provider, MD  colestipol (COLESTID) 1 G tablet Take 2 g by mouth 3 (three) times daily with meals.   Yes Historical Provider, MD  HYDROmorphone (DILAUDID) 2 MG tablet Take 1 tablet (2 mg total) by mouth every 6 (six) hours as needed. 08/18/13  Yes Annita Brod, MD  hydroxychloroquine (PLAQUENIL) 200 MG tablet Take 200 mg by mouth 2 (two) times daily with a meal.   Yes Historical Provider, MD  LACTASE PO Take 2 tablets by mouth 3 (three) times daily with meals.    Yes Historical Provider, MD  levothyroxine (SYNTHROID, LEVOTHROID) 200 MCG tablet Take 400 mcg by mouth daily before breakfast.    Yes Historical Provider, MD  loperamide (IMODIUM) 2 MG capsule Take by mouth as needed for diarrhea or loose stools.   Yes Historical Provider, MD  LORazepam (ATIVAN) 0.5 MG tablet Take 1 mg by mouth 2 (two) times daily.    Yes Historical Provider, MD  Loma [place] (OYSTER CALCIUM) 500 MG TABS tablet Take 500 mg of elemental calcium by mouth daily.    Yes Historical Provider, MD  predniSONE (DELTASONE) 5 MG tablet Take 5 mg by mouth daily with breakfast.  03/25/13  Yes Historical Provider, MD  primidone (MYSOLINE) 250 MG tablet Take 125 mg by mouth 2 (two) times daily.  06/19/13  Yes Kathrynn Ducking, MD  primidone (MYSOLINE) 50 MG tablet Take 1 tablet (50 mg total) by mouth at bedtime. 07/31/13  Yes Kathrynn Ducking, MD  sertraline (ZOLOFT) 100 MG tablet Take 100 mg by mouth daily.    Yes Historical Provider, MD  vitamin C (ASCORBIC ACID) 500 MG tablet Take 500 mg by mouth daily.   Yes Historical Provider, MD     Allergies  Allergen Reactions  . Demerol   . Fentanyl   . Metoclopramide Hcl   . Morphine And Related   . Nsaids     Upset stomach  . Pentazocine   . Pentazocine Lactate   . Versed [Midazolam]     Social History:  reports that she quit smoking about 43 years ago. She has  never used smokeless tobacco. She reports that she drinks alcohol. She reports that she does not use illicit drugs.     Family History  Problem Relation Age of Onset  . Cancer Father   . Heart failure Sister   . Tremor Maternal Grandmother   . Tremor Maternal Grandfather   . Tremor Paternal Grandmother   . Tremor Paternal Grandfather       Physical Exam:  GEN:  Pleasant  80 y.o. female  examined  and in no  acute distress; cooperative with exam Filed Vitals:   08/23/13 2230 08/23/13 2300 08/23/13 2330 08/24/13 0000  BP: 119/59 123/66 117/55 136/72  Pulse: 66 72 69 74  Temp:      TempSrc:      Resp:      SpO2: 99% 100% 100% 97%   Blood pressure 136/72, pulse 74, temperature 98.2 F (36.8 C), temperature source Oral, resp. rate 16, last menstrual period 06/08/1986, SpO2 97.00%. PSYCH: She is alert and oriented x4; does not appear anxious does not appear depressed; affect is normal HEENT: Normocephalic and Atraumatic, Mucous membranes pink; PERRLA; EOM intact; Fundi:  Benign;  No scleral icterus, Nares: Patent, Oropharynx: Clear, Fair Dentition, Neck:  FROM, no cervical lymphadenopathy nor thyromegaly or carotid bruit; no JVD; Breasts:: Not examined CHEST WALL: No tenderness CHEST: Normal respiration, clear to auscultation bilaterally HEART: Regular rate and rhythm; no murmurs rubs or gallops BACK: No kyphosis or scoliosis; no CVA tenderness ABDOMEN: Positive Bowel Sounds, soft non-tender; no masses, no organomegaly. Rectal Exam: Not done EXTREMITIES: No cyanosis, clubbing or edema; no ulcerations. Genitalia: not examined PULSES: 2+ and symmetric SKIN: Normal hydration no rash or ulceration CNS:  Alert x Oriented X 4, No Focal Deficits Vascular: pulses palpable throughout    Labs on Admission:  Basic Metabolic Panel:  Recent Labs Lab 08/17/13 1330 08/18/13 0418 08/23/13 2109  NA 140 137 132*  K 3.7 3.4* 4.2  CL 98 97 94*  CO2 27 26 25   GLUCOSE 85 104* 113*  BUN 18  11 19   CREATININE 0.62 0.57 0.64  CALCIUM 8.8 8.4 8.7   Liver Function Tests: No results found for this basename: AST, ALT, ALKPHOS, BILITOT, PROT, ALBUMIN,  in the last 168 hours No results found for this basename: LIPASE, AMYLASE,  in the last 168 hours No results found for this basename: AMMONIA,  in the last 168 hours CBC:  Recent Labs Lab 08/17/13 1330 08/18/13 0418 08/23/13 2109  WBC 6.0 6.9 7.5  NEUTROABS 4.8  --  5.8  HGB 13.4 12.6 12.7  HCT 39.8 37.7 37.9  MCV 98.0 96.9 97.7  PLT 104* 97* 142*   Cardiac Enzymes: No results found for this basename: CKTOTAL, CKMB, CKMBINDEX, TROPONINI,  in the last 168 hours  BNP (last 3 results) No results found for this basename: PROBNP,  in the last 8760 hours CBG: No results found for this basename: GLUCAP,  in the last 168 hours  Radiological Exams on Admission: Dg Chest 1 View  08/23/2013   CLINICAL DATA:  Recent fall.  Hip pain.  EXAM: CHEST - 1 VIEW  COMPARISON:  07/27/2010  FINDINGS: Mild cardiac enlargement. Calcification in the mitral valve annulus. Pulmonary vascularity is normal. Peribronchial thickening suggesting chronic bronchitis. No focal airspace disease or consolidation in the lungs. Surgical clips in the right axilla. Surgical clips in the base the neck. Similar appears to previous study.  IMPRESSION: No active disease.   Electronically Signed   By: Lucienne Capers M.D.   On: 08/23/2013 22:38   Dg Hip Complete Left  08/23/2013   CLINICAL DATA:  Bilateral hip pain after recent fall.  EXAM: LEFT HIP - COMPLETE 2+ VIEW  COMPARISON:  08/17/2013  FINDINGS: Left total hip arthroplasty. Components are unchanged since previous study. Again demonstrated are fractures of the superior and inferior pubic rami on the left. No significant change since previous study.  IMPRESSION: Left hip arthroplasty without change. Left pubic rami fractures also unchanged.   Electronically Signed  By: Lucienne Capers M.D.   On: 08/23/2013 22:39    Dg Hip Complete Right  08/23/2013   CLINICAL DATA:  Recent fall.  Bilateral hip pain.  EXAM: RIGHT HIP - COMPLETE 2+ VIEW  COMPARISON:  Acute abdominal series 07/27/2010  FINDINGS: Bilateral hip replacements are noted. There is left protrusio acetabula I. No hardware complicating feature otherwise. No fracture, subluxation or dislocation.  IMPRESSION: No acute bony abnormality.   Electronically Signed   By: Rolm Baptise M.D.   On: 08/23/2013 22:39   Ct Head Wo Contrast  08/23/2013   CLINICAL DATA:  Fall.  EXAM: CT HEAD WITHOUT CONTRAST  CT CERVICAL SPINE WITHOUT CONTRAST  TECHNIQUE: Contiguous axial images were obtained from the base of the skull through the vertex without intravenous contrast. Routine helical imaging was performed through the cervical spine without intravenous contrast. Sagittal and coronal reconstructed images were performed and reviewed.  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 Extensive chronic microvascular changes throughout the deep white matter. No acute infarction. No hemorrhage or hydrocephalus. No extra-axial fluid collection. No acute calvarial abnormality. Visualized paranasal sinuses and mastoids clear. Orbital soft tissues unremarkable.  CT cervical:  Diffuse degenerative disc and facet disease. Solid bony fusion from C5-C7. Prevertebral soft tissues are normal. Alignment is normal. No fracture. No epidural or paraspinal hematoma.  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  Atrophy, chronic microvascular disease.  No acute intracranial abnormality.  No acute bony abnormality within the cervical spine.   Electronically Signed   By: Rolm Baptise M.D.   On: 08/23/2013 22:21   Ct Cervical Spine Wo Contrast  08/23/2013   CLINICAL DATA:  Fall.  EXAM: CT HEAD WITHOUT CONTRAST  CT CERVICAL SPINE WITHOUT CONTRAST  TECHNIQUE: Contiguous axial images were obtained from the base of the skull  through the vertex without intravenous contrast. Routine helical imaging was performed through the cervical spine without intravenous contrast. Sagittal and coronal reconstructed images were performed and reviewed.  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 Extensive chronic microvascular changes throughout the deep white matter. No acute infarction. No hemorrhage or hydrocephalus. No extra-axial fluid collection. No acute calvarial abnormality. Visualized paranasal sinuses and mastoids clear. Orbital soft tissues unremarkable.  CT cervical:  Diffuse degenerative disc and facet disease. Solid bony fusion from C5-C7. Prevertebral soft tissues are normal. Alignment is normal. No fracture. No epidural or paraspinal hematoma.  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  Atrophy, chronic microvascular disease.  No acute intracranial abnormality.  No acute bony abnormality within the cervical spine.   Electronically Signed   By: Rolm Baptise M.D.   On: 08/23/2013 22:21     Assessment/Plan:   80 y.o. female with  Principal Problem:   Acute pain Active Problems:   Pelvic fracture   Essential hypertension, benign   Unspecified hypothyroidism   Rheumatoid arthritis    1.  Acute Pain-  Adjust pain Regimen. PRN IV Dilaudid .    2.   Pelvic Fracture- PT evaluation for acgtivity  3.   HTN- continue Atenolol Rx.  Monitor BPs.    4.   Hypothyroid- Continue Levothyroxine Check TSH level.    5.   Rheumatoid Arthritis- continue Plaquenil rx, and Prednisone Rx.     6.  DVT prophylaxis with Lovenox  7.  Other-  SW consult for higher level of care, to  [facility].       Code Status:    DO NOT RESUSCITATE (DNR) Family Communication:    No Family Present Disposition Plan:    Observation Status     Time spent:  64 Minutes  [facility] Pager [phone] If 7PM-7AM, please contact  night-coverage [email or web address] Password [facility] 08/24/2013, 12:18 AM

## 2013-08-24 NOTE — ED Notes (Signed)
Vital signs stable. 

## 2013-08-24 NOTE — Progress Notes (Signed)
CRITICAL VALUE ALERT  Critical value received:  Calcium 6.2  Date of notification:  08/24/2013  Time of notification:  0830  Critical value read back: Yes  Nurse who received alert:  Colin Broach  MD notified (1st page):  Conley Canal  Time of first page:  [phone]  MD notified (2nd page): Conley Canal  Time of second page: [phone]  Responding MD:  Conley Canal   Time MD responded:  [phone]

## 2013-08-24 NOTE — Progress Notes (Signed)
Utilization review completed.  

## 2013-08-24 NOTE — Progress Notes (Signed)
Clinical Social Work Department BRIEF PSYCHOSOCIAL ASSESSMENT 08/24/2013  Patient:  EZELL, MELIKIAN     Account Number:  [id number]     Admit date:  08/23/2013  Clinical Social Worker:  Rolinda Roan  Date/Time:  08/24/2013 05:31 PM  Referred by:  Physician  Date Referred:  08/24/2013 Referred for  SNF Placement   Other Referral:   Interview type:  Family Other interview type:    PSYCHOSOCIAL DATA Living Status:  FACILITY Admitted from facility:  [facility] Level of care:  Assisted Living Primary support name:  Shyasia Funches [phone] Primary support relationship to patient:  CHILD, ADULT Degree of support available:   Good support.    CURRENT CONCERNS  Other Concerns:    SOCIAL WORK ASSESSMENT / PLAN Clinical Social Worker (CSW) met with patient's son Shanon Brow at bedside. Shanon Brow reported that he is patient's HPOA. Shanon Brow stated that patient lives at [facility] with her husband. Shanon Brow reported that he wants the patient to return to [facility] so she can be with her husband and in familiar surroundings. Son is agreeable to SNF search in [place].   Assessment/plan status:  Psychosocial Support/Ongoing Assessment of Needs Other assessment/ plan:   Information/referral to community resources:    PATIENT'S/FAMILY'S RESPONSE TO PLAN OF CARE: Son thanked CSW for assisting with placement process. Son reported that he doesn't feel like the pelvic fracture is causing all the pain and believes it is something else. CSW provided emotional support and encouraged patient to talk with the MD about his concerns.

## 2013-08-24 NOTE — ED Notes (Signed)
Family at bedside. 

## 2013-08-25 ENCOUNTER — Observation Stay (HOSPITAL_COMMUNITY): Payer: Medicare Other

## 2013-08-25 DIAGNOSIS — M8448XA Pathological fracture, other site, initial encounter for fracture: Secondary | ICD-10-CM | POA: Diagnosis present

## 2013-08-25 LAB — BASIC METABOLIC PANEL
BUN: 10 mg/dL (ref 6–23)
CHLORIDE: 97 meq/L (ref 96–112)
CO2: 26 mEq/L (ref 19–32)
Calcium: 8.6 mg/dL (ref 8.4–10.5)
Creatinine, Ser: 0.57 mg/dL (ref 0.50–1.10)
GFR calc non Af Amer: 87 mL/min — ABNORMAL LOW (ref >90)
Glucose, Bld: 117 mg/dL — ABNORMAL HIGH (ref 70–99)
POTASSIUM: 4.2 meq/L (ref 3.7–5.3)
Sodium: 137 mEq/L (ref 137–147)

## 2013-08-25 LAB — CBC
HEMATOCRIT: 39.2 % (ref 36.0–46.0)
HEMOGLOBIN: 13 g/dL (ref 12.0–15.0)
MCH: 32.3 pg (ref 26.0–34.0)
MCHC: 33.2 g/dL (ref 30.0–36.0)
MCV: 97.3 fL (ref 78.0–100.0)
Platelets: 168 10*3/uL (ref 150–400)
RBC: 4.03 MIL/uL (ref 3.87–5.11)
RDW: 13.1 % (ref 11.5–15.5)
WBC: 6.3 10*3/uL (ref 4.0–10.5)

## 2013-08-25 MED ORDER — GUAIFENESIN-DM 100-10 MG/5ML PO SYRP
5.0000 mL | ORAL_SOLUTION | ORAL | Status: DC | PRN
  Filled 2013-08-25: qty 5

## 2013-08-25 NOTE — Progress Notes (Addendum)
[facility] PROGRESS NOTE  [name] BTD:[id number] DOB: [birth year] DOA: 08/23/2013 PCP: MAZZOCCHI, Reggy Eye, MD  Assessment/Plan:  Principal Problem:   Acute pain Active Problems:   Left pubic rami fractures   Essential hypertension, benign   Unspecified hypothyroidism   Rheumatoid arthritis   Bilateral sacral insufficiency fracture, likely present previously, but not seen on imaging: discussed with Dr. Rhona Raider. WBAT, SNF. nonoperative management. Expect prolonged recovery Hypothyroidism: TSH high. It appears synthroid was recently increased. Continue synthroid 400 daily and recheck in 2-3 months  Code Status:  DNR Family Communication:  Son at bedside Disposition Plan:  SNF  HPI/Subjective: Pain better. Has not yet worked with PT  Objective: Filed Vitals:   08/25/13 0652  BP: 138/59  Pulse: 65  Temp: 98.3 F (36.8 C)  Resp: 18    Intake/Output Summary (Last 24 hours) at 08/25/13 1510 Last data filed at 08/25/13 0900  Gross per 24 hour  Intake    480 ml  Output      0 ml  Net    480 ml   Filed Weights   08/24/13 1521  Weight: 54.432 kg (120 lb)    Exam:   General:  Less confused. comfortable  Cardiovascular: RRR without MGR  Respiratory: CTA without WRR  Abdomen: S, NT, ND   Ext: no CCE  Basic Metabolic Panel:  Recent Labs Lab 08/23/13 2109 08/24/13 0700 08/25/13 1047  NA 132* 141 137  K 4.2 3.3* 4.2  CL 94* 111 97  CO2 25 19 26   GLUCOSE 113* 81 117*  BUN 19 12 10   CREATININE 0.64 0.46* 0.57  CALCIUM 8.7 6.2* 8.6   Liver Function Tests: No results found for this basename: AST, ALT, ALKPHOS, BILITOT, PROT, ALBUMIN,  in the last 168 hours No results found for this basename: LIPASE, AMYLASE,  in the last 168 hours No results found for this basename: AMMONIA,  in the last 168 hours CBC:  Recent Labs Lab 08/23/13 2109 08/24/13 0700 08/25/13 1047  WBC 7.5 4.5 6.3  NEUTROABS 5.8  --   --   HGB 12.7 9.4* 13.0  HCT 37.9  28.3* 39.2  MCV 97.7 97.9 97.3  PLT 142* 111* 168   Cardiac Enzymes: No results found for this basename: CKTOTAL, CKMB, CKMBINDEX, TROPONINI,  in the last 168 hours BNP (last 3 results) No results found for this basename: PROBNP,  in the last 8760 hours CBG: No results found for this basename: GLUCAP,  in the last 168 hours  Recent Results (from the past 240 hour(s))  URINE CULTURE     Status: None   Collection Time    08/17/13  9:15 PM      Result Value Ref Range Status   Specimen Description URINE, CLEAN CATCH   Final   Special Requests NONE   Final   Culture  Setup Time     Final   Value: 08/17/2013 22:24     Performed at [facility] Count     Final   Value: 20,OOO COLONIES/ML     Performed at [facility]   Culture     Final   Value: Multiple bacterial morphotypes present, none predominant. Suggest appropriate recollection if clinically indicated.     Performed at [facility]   Report Status 08/18/2013 FINAL   Final     Studies: Dg Chest 1 View  08/23/2013   CLINICAL DATA:  Recent fall.  Hip pain.  EXAM: CHEST - 1  VIEW  COMPARISON:  07/27/2010  FINDINGS: Mild cardiac enlargement. Calcification in the mitral valve annulus. Pulmonary vascularity is normal. Peribronchial thickening suggesting chronic bronchitis. No focal airspace disease or consolidation in the lungs. Surgical clips in the right axilla. Surgical clips in the base the neck. Similar appears to previous study.  IMPRESSION: No active disease.   Electronically Signed   By: Lucienne Capers M.D.   On: 08/23/2013 22:38   Dg Hip Complete Left  08/23/2013   CLINICAL DATA:  Bilateral hip pain after recent fall.  EXAM: LEFT HIP - COMPLETE 2+ VIEW  COMPARISON:  08/17/2013  FINDINGS: Left total hip arthroplasty. Components are unchanged since previous study. Again demonstrated are fractures of the superior and inferior pubic rami on the left. No significant change since previous study.   IMPRESSION: Left hip arthroplasty without change. Left pubic rami fractures also unchanged.   Electronically Signed   By: Lucienne Capers M.D.   On: 08/23/2013 22:39   Dg Hip Complete Right  08/23/2013   CLINICAL DATA:  Recent fall.  Bilateral hip pain.  EXAM: RIGHT HIP - COMPLETE 2+ VIEW  COMPARISON:  Acute abdominal series 07/27/2010  FINDINGS: Bilateral hip replacements are noted. There is left protrusio acetabula I. No hardware complicating feature otherwise. No fracture, subluxation or dislocation.  IMPRESSION: No acute bony abnormality.   Electronically Signed   By: Rolm Baptise M.D.   On: 08/23/2013 22:39   Ct Head Wo Contrast  08/23/2013   CLINICAL DATA:  Fall.  EXAM: CT HEAD WITHOUT CONTRAST  CT CERVICAL SPINE WITHOUT CONTRAST  TECHNIQUE: Contiguous axial images were obtained from the base of the skull through the vertex without intravenous contrast. Routine helical imaging was performed through the cervical spine without intravenous contrast. Sagittal and coronal reconstructed images were performed and reviewed.  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 Extensive chronic microvascular changes throughout the deep white matter. No acute infarction. No hemorrhage or hydrocephalus. No extra-axial fluid collection. No acute calvarial abnormality. Visualized paranasal sinuses and mastoids clear. Orbital soft tissues unremarkable.  CT cervical:  Diffuse degenerative disc and facet disease. Solid bony fusion from C5-C7. Prevertebral soft tissues are normal. Alignment is normal. No fracture. No epidural or paraspinal hematoma.  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  Atrophy, chronic microvascular disease.  No acute intracranial abnormality.  No acute bony abnormality within the cervical spine.   Electronically Signed   By: Rolm Baptise M.D.   On: 08/23/2013 22:21   Ct Cervical Spine Wo Contrast  08/23/2013    CLINICAL DATA:  Fall.  EXAM: CT HEAD WITHOUT CONTRAST  CT CERVICAL SPINE WITHOUT CONTRAST  TECHNIQUE: Contiguous axial images were obtained from the base of the skull through the vertex without intravenous contrast. Routine helical imaging was performed through the cervical spine without intravenous contrast. Sagittal and coronal reconstructed images were performed and reviewed.  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 Extensive chronic microvascular changes throughout the deep white matter. No acute infarction. No hemorrhage or hydrocephalus. No extra-axial fluid collection. No acute calvarial abnormality. Visualized paranasal sinuses and mastoids clear. Orbital soft tissues unremarkable.  CT cervical:  Diffuse degenerative disc and facet disease. Solid bony fusion from C5-C7. Prevertebral soft tissues are normal. Alignment is normal. No fracture. No epidural or paraspinal hematoma.  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  Atrophy, chronic microvascular  disease.  No acute intracranial abnormality.  No acute bony abnormality within the cervical spine.   Electronically Signed   By: Rolm Baptise M.D.   On: 08/23/2013 22:21   Mr Lumbar Spine Wo Contrast  08/25/2013   CLINICAL DATA:  Status post fall April, 2012. Severe back and left hip pain.  EXAM: MRI LUMBAR SPINE WITHOUT CONTRAST  TECHNIQUE: Multiplanar, multisequence MR imaging was performed. No intravenous contrast was administered.  COMPARISON:  None.  FINDINGS: The patient has bilateral sacral ala fractures with associated marrow edema. There is a mild, remote superior endplate compression fracture of L5. Vertebral body height is otherwise maintained. 0.3 cm anterolisthesis L4 on L5 is identified. There is trace retrolisthesis of L2 on L3. The conus medullaris is normal in signal and position. Imaged intra-abdominal contents demonstrate T2 hyperintense lesions in  the kidneys consistent with cysts.  The T11-12 level is imaged in the sagittal plane only and negative.  T12-L1:  Negative.  L1-2: Tiny right paracentral protrusion without central canal or foraminal narrowing.  L2-3: There is ligamentum flavum thickening. Disc bulge and endplate spur are identified. Moderately severe central canal stenosis is present. Mild to moderate bilateral foraminal narrowing is identified.  L3-4: There is facet degenerative change and a mild disc bulge. Advanced facet arthropathy is identified and there is a shallow disc bulge. Moderate to moderately severe central canal stenosis is present. Foramina are open.  L4-5: The patient is status post posterior decompression. The central canal is decompressed. Foramina are open.  L5-S1: Status post posterior decompression. The central canal and foramina are open.  IMPRESSION: The study is positive for acute or subacute bilateral sacral ala fractures.  Remote, mild superior endplate compression fracture of L5.  Degenerative disease appearing worst at L2-3 where there is moderate to moderately severe central canal stenosis.   Electronically Signed   By: Inge Rise M.D.   On: 08/25/2013 13:55    Scheduled Meds: . aspirin EC  81 mg Oral Daily  . atenolol  25 mg Oral Daily  . calcium carbonate  1 tablet Oral Q breakfast  . calcium carbonate  1 tablet Oral TID WC & HS  . cholecalciferol  2,000 Units Oral Daily  . colestipol  2 g Oral TID WC  . enoxaparin (LOVENOX) injection  30 mg Subcutaneous Q24H  . hydroxychloroquine  200 mg Oral BID WC  . lactase  3,000 Units Oral TID WC  . levothyroxine  400 mcg Oral QAC breakfast  . LORazepam  0.5 mg Oral BID  . polyvinyl alcohol  2 drop Both Eyes QHS  . predniSONE  5 mg Oral Q breakfast  . primidone  125 mg Oral Daily  . primidone  175 mg Oral QHS  . senna-docusate  1 tablet Oral BID  . sertraline  100 mg Oral Daily  . vitamin C  500 mg Oral Daily   Continuous Infusions:   Time spent:  25 minutes  Delfina Redwood, MD  [facility] Pager [phone]. If 7PM-7AM, please contact night-coverage at [email or web address], password [email or web address] 08/25/2013, 3:10 PM  LOS: 2 days

## 2013-08-26 MED ORDER — ENOXAPARIN SODIUM 30 MG/0.3ML ~~LOC~~ SOLN: 30.0000 mg | SUBCUTANEOUS | Status: DC

## 2013-08-26 MED ORDER — OXYCODONE-ACETAMINOPHEN 5-325 MG PO TABS: 1.0000 | ORAL_TABLET | ORAL | Status: DC | PRN

## 2013-08-26 MED ORDER — LORAZEPAM 0.5 MG PO TABS: 0.5000 mg | ORAL_TABLET | Freq: Two times a day (BID) | ORAL | Status: DC

## 2013-08-26 MED ORDER — SENNOSIDES-DOCUSATE SODIUM 8.6-50 MG PO TABS: 1.0000 | ORAL_TABLET | Freq: Two times a day (BID) | ORAL | Status: DC

## 2013-08-26 MED ORDER — ACETAMINOPHEN 325 MG PO TABS: 650.0000 mg | ORAL_TABLET | Freq: Four times a day (QID) | ORAL | Status: DC | PRN

## 2013-08-26 MED ORDER — MAGNESIUM HYDROXIDE 400 MG/5ML PO SUSP: 30.0000 mL | Freq: Every day | ORAL | Status: DC | PRN

## 2013-08-26 MED ORDER — ONDANSETRON HCL 4 MG PO TABS: 4.0000 mg | ORAL_TABLET | Freq: Four times a day (QID) | ORAL | Status: DC | PRN

## 2013-08-26 NOTE — Discharge Summary (Signed)
Physician Discharge Summary  [name] HKV:[id number] DOB: 07-16-1933 DOA: 08/23/2013  PCP: Suszanne Conners, MD  Admit date: 08/23/2013 Discharge date: 08/26/2013  Recommendations for Outpatient Follow-up:  1. Continue PT, OT at SNF 2. Check TSH in 1-2 months  Discharge Diagnoses:  Principal Problem:   Acute pain Active Problems:   Pelvic fracture left pubic rami   Essential hypertension, benign   Unspecified hypothyroidism   Rheumatoid arthritis   Bilateral sacral insufficiency fracture History of breast and colon cancer  Discharge Condition: stable  Filed Weights   08/24/13 1521  Weight: 54.432 kg (120 lb)    History of present illness:  80 y.o. female who suffered a left pubic rami fracture from a fall 1 week ago who was discharged to the [facility] facility.  SNF was recommended at discharge, but patient declined. sent to the ED due to intactible pain and difficulty walking. She has not been able to walk or bear weight, and reports increased pain which has not been controlled with her pain rx. Initial films showed no new fractures  Hospital Course:  Placed on observation for pain control, PT eval.  family voiced concern about radiculopathy, as patient had previously complained of shooting pain down her leg. MRI lumbar spine was done and showed no acute spinal problem, but did show bilateral sacral fractures. These were likely present after initial fall, but not seen on plain films. Dr. Rhona Raider had been consulted on previous admission. I discussed the case with him and he recommended conservative management, weightbearing as tolerated, SNF placement. Patient has worked with physical therapy and got up to a chair. She continues to have pain but it is fairly well controlled. She is stable to transfer to skilled nursing facility.  Procedures:  None  Consultations:  Orthopedics by phone  CODE STATUS: DO NOT RESUSCITATE  Discharge Exam: Filed  Vitals:   08/26/13 0529  BP: 118/54  Pulse: 61  Temp: 98.2 F (36.8 C)  Resp: 18    General: Comfortable in bed. Alert oriented. Cardiovascular: Regular rate rhythm without murmurs gallops rubs  Respiratory: Clear to auscultation bilaterally without wheezes rhonchi or rales Extremities no clubbing cyanosis or edema  Discharge Orders   Future Appointments Provider Department Dept Phone   11/05/2013 3:00 PM [facility] 4 (Chest) [facility] [phone]   11/10/2013 3:00 PM [facility] [phone]   11/11/2013 3:15 PM Curt Bears, MD [facility] [phone]   08/04/2014 3:30 PM Ladell Heads [facility] [phone]   Future Orders Complete By Expires   Diet - low sodium heart healthy  As directed    Walk with assistance  As directed    Scheduling Instructions:   WBAT       Medication List    STOP taking these medications       HYDROmorphone 2 MG tablet  Commonly known as:  DILAUDID     loperamide 2 MG capsule  Commonly known as:  IMODIUM      TAKE these medications       acetaminophen 325 MG tablet  Commonly known as:  TYLENOL  Take 2 tablets (650 mg total) by mouth every 6 (six) hours as needed for mild pain (or Fever >/= 101).     aspirin 81 MG tablet  Take 81 mg by mouth daily.     atenolol 25 MG tablet  Commonly known as:  TENORMIN  Take 25 mg by mouth daily.     calcium carbonate 500 MG chewable tablet  Commonly known as:  TUMS - dosed in mg elemental calcium  Chew 1 tablet by mouth 4 (four) times daily.     colestipol 1 G tablet  Commonly known as:  COLESTID  Take 2 g by mouth 3 (three) times daily with meals.     enoxaparin 30 MG/0.3ML injection  Commonly known as:  LOVENOX  Inject 0.3 mLs (30 mg total) into the skin daily. For 4 weeks, or until ambulatory     hydroxychloroquine 200 MG tablet  Commonly known  as:  PLAQUENIL  Take 200 mg by mouth 2 (two) times daily with a meal.     LACTASE PO  Take 2 tablets by mouth 3 (three) times daily with meals.     levothyroxine 200 MCG tablet  Commonly known as:  SYNTHROID, LEVOTHROID  Take 400 mcg by mouth daily before breakfast.     LORazepam 0.5 MG tablet  Commonly known as:  ATIVAN  Take 1 tablet (0.5 mg total) by mouth 2 (two) times daily. Hold for sedation     magnesium hydroxide 400 MG/5ML suspension  Commonly known as:  MILK OF MAGNESIA  Take 30 mLs by mouth daily as needed for mild constipation.     ondansetron 4 MG tablet  Commonly known as:  ZOFRAN  Take 1 tablet (4 mg total) by mouth every 6 (six) hours as needed for nausea.     oxyCODONE-acetaminophen 5-325 MG per tablet  Commonly known as:  PERCOCET/ROXICET  Take 1-2 tablets by mouth every 4 (four) hours as needed for severe pain.     oyster calcium 500 MG Tabs tablet  Take 500 mg of elemental calcium by mouth daily.     predniSONE 5 MG tablet  Commonly known as:  DELTASONE  Take 5 mg by mouth daily with breakfast.     primidone 250 MG tablet  Commonly known as:  MYSOLINE  Take 125 mg by mouth 2 (two) times daily.     primidone 50 MG tablet  Commonly known as:  MYSOLINE  Take 1 tablet (50 mg total) by mouth at bedtime.     REFRESH 1 % ophthalmic solution  Generic drug:  carboxymethylcellulose  Apply 1-2 drops to eye at bedtime.     REFRESH OPTIVE OP  Apply 1-2 drops to eye.     senna-docusate 8.6-50 MG per tablet  Commonly known as:  Senokot-S  Take 1 tablet by mouth 2 (two) times daily.     sertraline 100 MG tablet  Commonly known as:  ZOLOFT  Take 100 mg by mouth daily.     vitamin C 500 MG tablet  Commonly known as:  ASCORBIC ACID  Take 500 mg by mouth daily.     Vitamin D 2000 UNITS Caps  Take 1 capsule by mouth daily.       Allergies  Allergen Reactions  . Demerol   . Fentanyl   . Metoclopramide Hcl   . Morphine And Related   . Nsaids      Upset stomach  . Pentazocine   . Pentazocine Lactate   . Versed [Midazolam]        Follow-up Information   Follow up with Hessie Dibble, MD In 4 weeks.   Specialty:  Orthopedic Surgery   Contact information:   [place] [address] [phone]        The results of significant diagnostics  from this hospitalization (including imaging, microbiology, ancillary and laboratory) are listed below for reference.    Significant Diagnostic Studies: Dg Chest 1 View  08/23/2013   CLINICAL DATA:  Recent fall.  Hip pain.  EXAM: CHEST - 1 VIEW  COMPARISON:  07/27/2010  FINDINGS: Mild cardiac enlargement. Calcification in the mitral valve annulus. Pulmonary vascularity is normal. Peribronchial thickening suggesting chronic bronchitis. No focal airspace disease or consolidation in the lungs. Surgical clips in the right axilla. Surgical clips in the base the neck. Similar appears to previous study.  IMPRESSION: No active disease.   Electronically Signed   By: Lucienne Capers M.D.   On: 08/23/2013 22:38   Dg Hip Complete Left  08/23/2013   CLINICAL DATA:  Bilateral hip pain after recent fall.  EXAM: LEFT HIP - COMPLETE 2+ VIEW  COMPARISON:  08/17/2013  FINDINGS: Left total hip arthroplasty. Components are unchanged since previous study. Again demonstrated are fractures of the superior and inferior pubic rami on the left. No significant change since previous study.  IMPRESSION: Left hip arthroplasty without change. Left pubic rami fractures also unchanged.   Electronically Signed   By: Lucienne Capers M.D.   On: 08/23/2013 22:39   Dg Hip Complete Left  08/17/2013   CLINICAL DATA:  Fall, left hip pain  EXAM: LEFT HIP - COMPLETE 2+ VIEW  COMPARISON:  DG ABD 2 VIEWS dated 07/27/2010; CT PELVIS W/CM dated 05/28/2008  FINDINGS: Bilateral total hip arthroplasties are reidentified. Left femoral prosthetic component is properly located. Cortical deformity of the left superior and inferior pubic  rami is noted. This area was incompletely imaged on the most recent dissimilar prior exam 07/27/2010. These findings appear new from the prior exam 05/28/2008. There is a suggestion of minimal callus formation.  IMPRESSION: Left superior and inferior pubic rami deformities which are new since 2010 prior exam but demonstrate minimal callus formation that could indicate these may be subacute. MRI may be helpful for further determination of chronicity of findings if clinically needed.   Electronically Signed   By: Conchita Paris M.D.   On: 08/17/2013 15:08   Dg Hip Complete Right  08/23/2013   CLINICAL DATA:  Recent fall.  Bilateral hip pain.  EXAM: RIGHT HIP - COMPLETE 2+ VIEW  COMPARISON:  Acute abdominal series 07/27/2010  FINDINGS: Bilateral hip replacements are noted. There is left protrusio acetabula I. No hardware complicating feature otherwise. No fracture, subluxation or dislocation.  IMPRESSION: No acute bony abnormality.   Electronically Signed   By: Rolm Baptise M.D.   On: 08/23/2013 22:39   Ct Head Wo Contrast  08/23/2013   CLINICAL DATA:  Fall.  EXAM: CT HEAD WITHOUT CONTRAST  CT CERVICAL SPINE WITHOUT CONTRAST  TECHNIQUE: Contiguous axial images were obtained from the base of the skull through the vertex without intravenous contrast. Routine helical imaging was performed through the cervical spine without intravenous contrast. Sagittal and coronal reconstructed images were performed and reviewed.  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 Extensive chronic microvascular changes throughout the deep white matter. No acute infarction. No hemorrhage or hydrocephalus. No extra-axial fluid collection. No acute calvarial abnormality. Visualized paranasal sinuses and mastoids clear. Orbital soft tissues unremarkable.  CT cervical:  Diffuse degenerative disc and facet disease. Solid bony fusion from C5-C7. Prevertebral soft tissues are  normal. Alignment is normal. No fracture. No epidural or paraspinal hematoma.  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  Atrophy,  chronic microvascular disease.  No acute intracranial abnormality.  No acute bony abnormality within the cervical spine.   Electronically Signed   By: Rolm Baptise M.D.   On: 08/23/2013 22:21   Ct Head Wo Contrast  08/17/2013   CLINICAL DATA:  Status post fall 2 days ago. History of previous intracranial hemorrhage.  EXAM: CT HEAD WITHOUT CONTRAST  CT CERVICAL SPINE WITHOUT CONTRAST  TECHNIQUE: Multidetector CT imaging of the head and cervical spine was performed following the standard protocol without intravenous contrast. Multiplanar CT image reconstructions of the cervical spine were also generated.  COMPARISON:  CT HEAD W/O CM dated 07/28/2010  FINDINGS: CT HEAD FINDINGS  There is mild diffuse cerebral and cerebellar atrophy with compensatory ventriculomegaly. There is encephalomalacia in the right frontal lobe. There is decreased density in the deep white matter of both cerebral hemispheres consistent with chronic small vessel ischemic type change. The cerebellum and brainstem exhibit normal density. There are no abnormal intracranial calcifications.  At bone window settings the observed portions of the paranasal sinuses are clear. There is a small amount of fluid within the left mastoid air cells. There is no evidence of an acute skull fracture.  CT CERVICAL SPINE FINDINGS  The cervical vertebral bodies are preserved in height. 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 There is mild degenerative change at C4-5 and C7-T1. There is no evidence of a perched facet. There is mild facet joint hypertrophy at multiple levels. The odontoid is intact. The lateral masses of C1 align normally with those of C2. There is degenerative change of the atlanto-dens articulation and of the articulation of the lateral masses of C1 with C2. The bony ring at  each cervical level is intact. There is no acute fracture of the observed portions of the first and second ribs. Old deformities of the first ribs. The pulmonary apices exhibit minimal fibrotic change. The soft tissues of the neck exhibit no acute abnormalities. There is dense calcification in the carotid arterial system.  IMPRESSION: 1. There are chronic changes within the brain as described consistent with previous ischemic insult in the right frontal lobe as well as chronic small vessel ischemic change diffusely. There is no acute intracranial hemorrhage. 2. There is no evidence of an acute skull fracture. 3. There is no evidence of an acute cervical spine fracture nor dislocation. 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   Electronically Signed   By: David  Martinique   On: 08/17/2013 15:49   Ct Cervical Spine Wo Contrast  08/23/2013   CLINICAL DATA:  Fall.  EXAM: CT HEAD WITHOUT CONTRAST  CT CERVICAL SPINE WITHOUT CONTRAST  TECHNIQUE: Contiguous axial images were obtained from the base of the skull through the vertex without intravenous contrast. Routine helical imaging was performed through the cervical spine without intravenous contrast. Sagittal and coronal reconstructed images were performed and reviewed.  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 Extensive chronic microvascular changes throughout the deep white matter. No acute infarction. No hemorrhage or hydrocephalus. No extra-axial fluid collection. No acute calvarial abnormality. Visualized paranasal sinuses and mastoids clear. Orbital soft tissues unremarkable.  CT cervical:  Diffuse degenerative disc and facet disease. Solid bony fusion from C5-C7. Prevertebral soft tissues are normal. Alignment is normal. No fracture. No epidural or paraspinal hematoma.  IMPRESSION: Old right frontal  infarct with encephalomalacia.  Atrophy, chronic microvascular disease.  No acute intracranial abnormality.  No acute bony abnormality within the cervical spine.   Electronically Signed   By: Rolm Baptise M.D.   On: 08/23/2013 22:21   Ct Cervical Spine Wo Contrast  08/17/2013   CLINICAL DATA:  Status post fall 2 days ago. History of previous intracranial hemorrhage.  EXAM: CT HEAD WITHOUT CONTRAST  CT CERVICAL SPINE WITHOUT CONTRAST  TECHNIQUE: Multidetector CT imaging of the head and cervical spine was performed following the standard protocol without intravenous contrast. Multiplanar CT image reconstructions of the cervical spine were also generated.  COMPARISON:  CT HEAD W/O CM dated 07/28/2010  FINDINGS: CT HEAD FINDINGS  There is mild diffuse cerebral and cerebellar atrophy with compensatory ventriculomegaly. There is encephalomalacia in the right frontal lobe. There is decreased density in the deep white matter of both cerebral hemispheres consistent with chronic small vessel ischemic type change. The cerebellum and brainstem exhibit normal density. There are no abnormal intracranial calcifications.  At bone window settings the observed portions of the paranasal sinuses are clear. There is a small amount of fluid within the left mastoid air cells. There is no evidence of an acute skull fracture.  CT CERVICAL SPINE FINDINGS  The cervical vertebral bodies are preserved in height. 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 There is mild degenerative change at C4-5 and C7-T1. There is no evidence of a perched facet. There is mild facet joint hypertrophy at multiple levels. The odontoid is intact. The lateral masses of C1 align normally with those of C2. There is degenerative change of the atlanto-dens articulation and of the articulation of the lateral masses of C1 with C2. The bony ring at each cervical level is intact. There is no acute fracture of the observed portions of the first and  second ribs. Old deformities of the first ribs. The pulmonary apices exhibit minimal fibrotic change. The soft tissues of the neck exhibit no acute abnormalities. There is dense calcification in the carotid arterial system.  IMPRESSION: 1. There are chronic changes within the brain as described consistent with previous ischemic insult in the right frontal lobe as well as chronic small vessel ischemic change diffusely. There is no acute intracranial hemorrhage. 2. There is no evidence of an acute skull fracture. 3. There is no evidence of an acute cervical spine fracture nor dislocation. 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   Electronically Signed   By: David  Martinique   On: 08/17/2013 15:49   Mr Brain Wo Contrast  08/17/2013   CLINICAL DATA:  History of breast cancer and stroke. Fall. Numbness or weakness left side. Question new stroke.  EXAM: MRI HEAD WITHOUT CONTRAST  TECHNIQUE: Multiplanar, multiecho pulse sequences of the brain and surrounding structures were obtained without intravenous contrast.  COMPARISON:  08/17/2013 CT.  11/23/2010 MR.  FINDINGS: No acute infarct.  Moderate size remote anterior right frontal lobe infarct with encephalomalacia with associated blood breakdown products.  Scattered blood breakdown products in the temporal lobes bilaterally and less so parietal lobes. This may reflect result of prior hemorrhagic ischemia. Cannot completely exclude trauma contributing to scattered blood breakdown products (such as shear injuries) although felt to be less likely consideration. No evidence of extra-axial blood collection.  Moderate small vessel disease type changes.  Global atrophy. Ventricular prominence may be related to central atrophy although  difficult to completely mild component of hydrocephalus. Ventricles are slightly more prominent than on the prior MR although atrophy has also progressed.   No intracranial mass lesion noted on this unenhanced exam.  Partial opacification mastoid air cells bilaterally. No fracture noted on accompanying CT. No obstructing lesion posterior superior nasopharynx causing eustachian tube dysfunction.  Major intracranial vascular structures are patent.  Transverse ligament hypertrophy. Cervical medullary junction, pituitary region, pineal region and orbital structures unremarkable.  IMPRESSION: No acute infarct.  Moderate size remote anterior right frontal lobe infarct.  Scattered blood breakdown products in the temporal lobes bilaterally and less so parietal lobes may reflect result of prior hemorrhagic ischemia. Cannot completely exclude trauma contributing to scattered blood breakdown products (such as shear injuries) although felt to be less likely consideration. No evidence of extra-axial blood collection.  Moderate small vessel disease type changes.  Global atrophy. Ventricular prominence may be related to central atrophy although difficult to completely mild component of hydrocephalus.  No intracranial mass lesion noted on this unenhanced exam.  Partial opacification mastoid air cells bilaterally   Electronically Signed   By: Chauncey Cruel M.D.   On: 08/17/2013 19:12   Mr Lumbar Spine Wo Contrast  08/25/2013   CLINICAL DATA:  Status post fall April, 2012. Severe back and left hip pain.  EXAM: MRI LUMBAR SPINE WITHOUT CONTRAST  TECHNIQUE: Multiplanar, multisequence MR imaging was performed. No intravenous contrast was administered.  COMPARISON:  None.  FINDINGS: The patient has bilateral sacral ala fractures with associated marrow edema. There is a mild, remote superior endplate compression fracture of L5. Vertebral body height is otherwise maintained. 0.3 cm anterolisthesis L4 on L5 is identified. There is trace retrolisthesis of L2 on L3. The conus medullaris is normal in signal and position. Imaged intra-abdominal contents demonstrate T2 hyperintense lesions in the  kidneys consistent with cysts.  The T11-12 level is imaged in the sagittal plane only and negative.  T12-L1:  Negative.  L1-2: Tiny right paracentral protrusion without central canal or foraminal narrowing.  L2-3: There is ligamentum flavum thickening. Disc bulge and endplate spur are identified. Moderately severe central canal stenosis is present. Mild to moderate bilateral foraminal narrowing is identified.  L3-4: There is facet degenerative change and a mild disc bulge. Advanced facet arthropathy is identified and there is a shallow disc bulge. Moderate to moderately severe central canal stenosis is present. Foramina are open.  L4-5: The patient is status post posterior decompression. The central canal is decompressed. Foramina are open.  L5-S1: Status post posterior decompression. The central canal and foramina are open.  IMPRESSION: The study is positive for acute or subacute bilateral sacral ala fractures.  Remote, mild superior endplate compression fracture of L5.  Degenerative disease appearing worst at L2-3 where there is moderate to moderately severe central canal stenosis.   Electronically Signed   By: Inge Rise M.D.   On: 08/25/2013 13:55   Mr Hip Left Wo Contrast  08/17/2013   CLINICAL DATA:  Left hip pain status post fall. Possible left pelvic fracture on radiographs. History of bilateral total hip arthroplasty.  EXAM: MRI OF THE LEFT HIP WITHOUT CONTRAST  TECHNIQUE: Multiplanar, multisequence MR imaging was performed. No intravenous contrast was administered.  COMPARISON:  DG HIP COMPLETE*L* dated 08/17/2013; CT PELVIS W/CM dated 05/28/2008  FINDINGS: Unfortunately, artifact from the bilateral total hip arthroplasties partially obscures the pubic rami bilaterally. T1 weighted images confirm the presence of nondisplaced fractures of the left superior and inferior pubic rami. There is  some marrow edema in the inferior pubic ramus fracture on the T2 weighted images. The right pubic rami, sacrum,  symphysis pubis and sacroiliac joints appear intact. There is no evidence of proximal femur fracture or dislocation.  There is some edema within the left proximal thigh adductor musculature. In addition, there is a edema and a probable focal hematoma inferiorly in the left gluteus maximus muscle. There is generalized subcutaneous edema in the pelvis and proximal thighs.  IMPRESSION: 1. Nondisplaced fractures of the left superior and inferior pubic rami. Although dating is limited by the artifact from the total hip arthroplasties, the adjacent soft tissue edema implies acuity. 2. Presumed focal hematoma in the left gluteus maximus muscle posterior to the ischium.   Electronically Signed   By: Camie Patience M.D.   On: 08/17/2013 19:38    Microbiology: Recent Results (from the past 240 hour(s))  URINE CULTURE     Status: None   Collection Time    08/17/13  9:15 PM      Result Value Ref Range Status   Specimen Description URINE, CLEAN CATCH   Final   Special Requests NONE   Final   Culture  Setup Time     Final   Value: 08/17/2013 22:24     Performed at [facility] Count     Final   Value: 20,OOO COLONIES/ML     Performed at [facility]   Culture     Final   Value: Multiple bacterial morphotypes present, none predominant. Suggest appropriate recollection if clinically indicated.     Performed at [facility]   Report Status 08/18/2013 FINAL   Final     Labs: Basic Metabolic Panel:  Recent Labs Lab 08/23/13 2109 08/24/13 0700 08/25/13 1047  NA 132* 141 137  K 4.2 3.3* 4.2  CL 94* 111 97  CO2 25 19 26   GLUCOSE 113* 81 117*  BUN 19 12 10   CREATININE 0.64 0.46* 0.57  CALCIUM 8.7 6.2* 8.6   Liver Function Tests: No results found for this basename: AST, ALT, ALKPHOS, BILITOT, PROT, ALBUMIN,  in the last 168 hours No results found for this basename: LIPASE, AMYLASE,  in the last 168 hours No results found for this basename: AMMONIA,  in the last 168  hours CBC:  Recent Labs Lab 08/23/13 2109 08/24/13 0700 08/25/13 1047  WBC 7.5 4.5 6.3  NEUTROABS 5.8  --   --   HGB 12.7 9.4* 13.0  HCT 37.9 28.3* 39.2  MCV 97.7 97.9 97.3  PLT 142* 111* 168   Cardiac Enzymes: No results found for this basename: CKTOTAL, CKMB, CKMBINDEX, TROPONINI,  in the last 168 hours BNP: BNP (last 3 results) No results found for this basename: PROBNP,  in the last 8760 hours CBG: No results found for this basename: GLUCAP,  in the last 168 hours     Signed:  [facility] 08/26/2013, 12:06 PM

## 2013-08-26 NOTE — Progress Notes (Signed)
CSW (Clinical Education officer, museum) spoke with pt son and informed that facility of choice, [facility] was not able to offer a bed at this time. Pt son reported he would like to go with [facility]. CSW spoke with facility admissions and confirmed they can accept pt today. CSW provided facility with pt son phone number (Cam [phone]). CSW to help with discharge once paperwork has been completed with facility.  [place], [place]

## 2013-08-26 NOTE — Progress Notes (Signed)
CSW (Clinical Social Worker) prepared pt dc packet and placed with shadow chart. CSW arranged non-emergent ambulance transport. Pt, pt family, pt nurse, and facility informed. CSW signing off.  Roderic Lammert, LCSWA [phone]  

## 2013-08-26 NOTE — Evaluation (Signed)
Physical Therapy Evaluation Patient Details Name: [name] MRN: [id number] DOB: 1933/09/07 Today's Date: 08/26/2013   History of Present Illness  [name] is a 80 y.o. female who suffered a pelvic fracture from a fall 1 week ago who was discharged to the [facility] facility who was sent to the ED due to intactible pain and difficulty walking.   She has not been able to walk or bear weight, and reports increased pain which has not been controlled with her pain rx.  Clinical Impression  Pt presents with increased pain in back and L LE which causes her to be max A for all mobility tasks. Pt will benefit from skliled PT services to address deficits in balance, mobility and transfers and increase functional independence.    Follow Up Recommendations SNF    Equipment Recommendations  None recommended by PT    Recommendations for Other Services       Precautions / Restrictions Precautions Precautions: Fall Restrictions Weight Bearing Restrictions: No      Mobility  Bed Mobility Overal bed mobility: Needs Assistance       Supine to sit: Max assist     General bed mobility comments: assist for LEs and trunk, use draw pad to scoot hips toward edge of bed  Transfers Overall transfer level: Needs assistance Equipment used: Rolling walker (2 wheeled) Transfers: Stand Pivot Transfers   Stand pivot transfers: +2 physical assistance       General transfer comment: pt requires max lifting assist and +2 mod A for safety with turning and pivoting to chair. requires max A to lift L LE.  pt with posterior lean, narrow BOS  Ambulation/Gait                Stairs            Wheelchair Mobility    Modified Rankin (Stroke Patients Only)       Balance                                             Pertinent Vitals/Pain Pt c/o 9/10 pain in back and L LE, RN made aware, repositioned and rest as appropriate    Home  Living Family/patient expects to be discharged to:: Skilled nursing facility                      Prior Function Level of Independence: Needs assistance         Comments: recently d/c to assisted living     Hand Dominance        Extremity/Trunk Assessment               Lower Extremity Assessment: Generalized weakness   LLE Deficits / Details: Grossly limited movement in all planes  Cervical / Trunk Assessment: Kyphotic  Communication   Communication: HOH  Cognition Arousal/Alertness: Awake/alert Behavior During Therapy: Anxious Overall Cognitive Status: No family/caregiver present to determine baseline cognitive functioning                      General Comments      Exercises        Assessment/Plan    PT Assessment Patient needs continued PT services  PT Diagnosis Difficulty walking;Generalized weakness;Acute pain   PT Problem List Decreased strength;Decreased mobility;Decreased activity tolerance;Decreased balance;Decreased knowledge of use of  DME;Pain;Decreased coordination  PT Treatment Interventions DME instruction;Therapeutic exercise;Gait training;Functional mobility training;Therapeutic activities;Patient/family education;Neuromuscular re-education;Modalities;Balance training   PT Goals (Current goals can be found in the Care Plan section) Acute Rehab PT Goals Patient Stated Goal: does not want to go back to [facility] PT Goal Formulation: With patient Time For Goal Achievement: 09/02/13    Frequency Min 2X/week   Barriers to discharge        Co-evaluation               End of Session Equipment Utilized During Treatment: Gait belt Activity Tolerance: Patient limited by pain Patient left: in chair;with call bell/phone within reach Nurse Communication: Mobility status;Patient requests pain meds    Functional Assessment Tool Used: Clinical Judgement Functional Limitation: Mobility: Walking and moving  around Mobility: Walking and Moving Around Current Status [phone]): At least 80 percent but less than 100 percent impaired, limited or restricted Mobility: Walking and Moving Around Goal Status [phone]): At least 40 percent but less than 60 percent impaired, limited or restricted    Time: 0835-0855 PT Time Calculation (min): 20 min   Charges:   PT Evaluation $Initial PT Evaluation Tier I: 1 Procedure PT Treatments $Therapeutic Activity: 8-22 mins   PT G Codes:   Functional Assessment Tool Used: Clinical Judgement Functional Limitation: Mobility: Walking and moving around    Kennith Gain 08/26/2013, 10:54 AM

## 2013-08-27 ENCOUNTER — Non-Acute Institutional Stay (SKILLED_NURSING_FACILITY): Payer: 59 | Admitting: Internal Medicine

## 2013-08-27 DIAGNOSIS — S329XXA Fracture of unspecified parts of lumbosacral spine and pelvis, initial encounter for closed fracture: Secondary | ICD-10-CM

## 2013-08-27 DIAGNOSIS — I10 Essential (primary) hypertension: Secondary | ICD-10-CM

## 2013-08-27 DIAGNOSIS — M069 Rheumatoid arthritis, unspecified: Secondary | ICD-10-CM

## 2013-08-27 DIAGNOSIS — E039 Hypothyroidism, unspecified: Secondary | ICD-10-CM

## 2013-08-28 ENCOUNTER — Encounter: Payer: Self-pay | Admitting: *Deleted

## 2013-08-30 NOTE — Progress Notes (Signed)
HISTORY & PHYSICAL  DATE: 08/27/2013   FACILITY: [facility]  LEVEL OF CARE: SNF (31)  ALLERGIES:  Allergies  Allergen Reactions  . Demerol   . Fentanyl   . Metoclopramide Hcl   . Morphine And Related   . Nsaids     Upset stomach  . Pentazocine   . Pentazocine Lactate   . Versed [Midazolam]     CHIEF COMPLAINT:  Manage left pelvic fracture, hypertension and hypothyroidism  HISTORY OF PRESENT ILLNESS: Patient is a 80 year old Caucasian female.  PELVIC FRACTURE: The patient had a fall and sustained a pelvic fracture. Conservative management was recommended by orthopedic surgery. Patient is admitted to this facility for short-term rehabilitation. The patient denies ongoing pain. No complications reported from the pain medication(s) currently being used.  HTN: Pt 's HTN remains stable.  Denies CP, sob, DOE, pedal edema, headaches, dizziness or visual disturbances.  No complications from the medications currently being used.  Last BP : 114/62  HYPOTHYROIDISM: The hypothyroidism remains stable. No complications noted from the medications presently being used.  The patient denies fatigue or constipation.  Last TSH not available.  PAST MEDICAL HISTORY :  Past Medical History  Diagnosis Date  . PE (pulmonary embolism)   . Subarachnoid hemorrhage   . Spastic hemiplegia affecting nondominant side   . Intracerebral hemorrhage   . Hypertension   . Arthritis   . Stroke 3/12  . Gastrointestinal problem   . Depression   . Breast CA     mastectomy  . Colon cancer   . Tremor     benign essential  . Rheumatoid arthritis(714.0)   . Hypothyroidism   . Dysrhythmia     palpitation  . Lymphedema of arm     right  . Atrophic vaginitis   . Osteoporosis   . Multinodular goiter   . Menieres disease 2002  . Thrombocytopenia 2002  . Gastroparesis 7/03  . Colon cancer 2007  . Pulmonary embolism 2003    chronic coumadin  . CVA (cerebral vascular  accident)     PAST SURGICAL HISTORY: Past Surgical History  Procedure Laterality Date  . Fracture surgery  2010    rt wrist  . Mastectomy      bilat mastectomies  . Breast surgery    . Joint replacement      Bilateral total hip replacement  . Hardware removal  03/29/2012    Procedure: HARDWARE REMOVAL;  Surgeon: Alta Corning, MD;  Location: [facility];  Service: Orthopedics;  Laterality: Left;  Screw Removal Left Foot  . Shoulder arthroscopy Right   . Thyroidectomy    . Total hip arthroplasty    . Cholecystectomy    . Tonsillectomy    . Left ankle      ruptured achilles tendon  . Carpal tunnel release Bilateral   . Thoracic outlet surgery Bilateral   . Ulnar nerve transposition Right   . Knee surgery Bilateral   . Lumbar laminectomy    . Nasal sinus surgery    . Cervical laminectomy    . Bunionectomy Right   . Right wrist    . Squamous cell carcinoma resec      SOCIAL HISTORY:  reports that she quit smoking about 43 years ago. She has never used smokeless tobacco. She reports that she drinks alcohol. She reports that she does not use illicit drugs.  FAMILY HISTORY:  Family History  Problem Relation Age of  Onset  . Cancer Father   . Heart failure Sister   . Tremor Maternal Grandmother   . Tremor Maternal Grandfather   . Tremor Paternal Grandmother   . Tremor Paternal Grandfather     CURRENT MEDICATIONS: Reviewed per MAR/see medication list  REVIEW OF SYSTEMS:  See HPI otherwise 14 point ROS is negative.  PHYSICAL EXAMINATION  VS:  See VS section  GENERAL: no acute distress, normal body habitus EYES: conjunctivae normal, sclerae normal, normal eye lids MOUTH/THROAT: lips without lesions,no lesions in the mouth,tongue is without lesions,uvula elevates in midline NECK: supple, trachea midline, no neck masses, no thyroid tenderness, no thyromegaly LYMPHATICS: no LAN in the neck, no supraclavicular LAN RESPIRATORY: breathing is even & unlabored,  BS CTAB CARDIAC: RRR, no murmur,no extra heart sounds, no edema GI:  ABDOMEN: abdomen soft, normal BS, no masses, no tenderness  LIVER/SPLEEN: no hepatomegaly, no splenomegaly MUSCULOSKELETAL: HEAD: normal to inspection & palpation BACK: no kyphosis, scoliosis or spinal processes tenderness EXTREMITIES: LEFT UPPER EXTREMITY: full range of motion, normal strength & tone RIGHT UPPER EXTREMITY:  full range of motion, normal strength & tone LEFT LOWER EXTREMITY: minimal range of motion, normal strength & tone RIGHT LOWER EXTREMITY: minimal  range of motion, normal strength & tone PSYCHIATRIC: the patient is alert & oriented to person, affect & behavior appropriate  LABS/RADIOLOGY:  Labs reviewed: Basic Metabolic Panel:  Recent Labs  08/23/13 2109 08/24/13 0700 08/25/13 1047  NA 132* 141 137  K 4.2 3.3* 4.2  CL 94* 111 97  CO2 25 19 26   GLUCOSE 113* 81 117*  BUN 19 12 10   CREATININE 0.64 0.46* 0.57  CALCIUM 8.7 6.2* 8.6   Liver Function Tests:  Recent Labs  10/15/12 1308 10/29/12 1414  AST 22 21  ALT 17 12  ALKPHOS 63 70  BILITOT 0.3 0.38  PROT 6.2 6.1*  ALBUMIN  --  3.5   CBC:  Recent Labs  10/29/12 1414 08/17/13 1330  08/23/13 2109 08/24/13 0700 08/25/13 1047  WBC 6.6 6.0  < > 7.5 4.5 6.3  NEUTROABS 4.8 4.8  --  5.8  --   --   HGB 12.6 13.4  < > 12.7 9.4* 13.0  HCT 36.7 39.8  < > 37.9 28.3* 39.2  MCV 98.6 98.0  < > 97.7 97.9 97.3  PLT 114* 104*  < > 142* 111* 168  < > = values in this interval not displayed.   MRI HEAD WITHOUT CONTRAST   TECHNIQUE: Multiplanar, multiecho pulse sequences of the brain and surrounding structures were obtained without intravenous contrast.   COMPARISON:  08/17/2013 CT.  11/23/2010 MR.   FINDINGS: No acute infarct.   Moderate size remote anterior right frontal lobe infarct with encephalomalacia with associated blood breakdown products.   Scattered blood breakdown products in the temporal lobes bilaterally and  less so parietal lobes. This may reflect result of prior hemorrhagic ischemia. Cannot completely exclude trauma contributing to scattered blood breakdown products (such as shear injuries) although felt to be less likely consideration. No evidence of extra-axial blood collection.   Moderate small vessel disease type changes.   Global atrophy. Ventricular prominence may be related to central atrophy although difficult to completely mild component of hydrocephalus. Ventricles are slightly more prominent than on the prior MR although atrophy has also progressed.   No intracranial mass lesion noted on this unenhanced exam.   Partial opacification mastoid air cells bilaterally. No fracture noted on accompanying CT. No obstructing lesion posterior superior nasopharynx  causing eustachian tube dysfunction.   Major intracranial vascular structures are patent.   Transverse ligament hypertrophy. Cervical medullary junction, pituitary region, pineal region and orbital structures unremarkable.   IMPRESSION: No acute infarct.   Moderate size remote anterior right frontal lobe infarct.   Scattered blood breakdown products in the temporal lobes bilaterally and less so parietal lobes may reflect result of prior hemorrhagic ischemia. Cannot completely exclude trauma contributing to scattered blood breakdown products (such as shear injuries) although felt to be less likely consideration. No evidence of extra-axial blood collection.   Moderate small vessel disease type changes.   Global atrophy. Ventricular prominence may be related to central atrophy although difficult to completely mild component of hydrocephalus.   No intracranial mass lesion noted on this unenhanced exam.   Partial opacification mastoid air cells bilaterally     MRI OF THE LEFT HIP WITHOUT CONTRAST   TECHNIQUE: Multiplanar, multisequence MR imaging was performed. No intravenous contrast was administered.     COMPARISON:  DG HIP COMPLETE*L* dated 08/17/2013; CT PELVIS W/CM dated 05/28/2008   FINDINGS: Unfortunately, artifact from the bilateral total hip arthroplasties partially obscures the pubic rami bilaterally. T1 weighted images confirm the presence of nondisplaced fractures of the left superior and inferior pubic rami. There is some marrow edema in the inferior pubic ramus fracture on the T2 weighted images. The right pubic rami, sacrum, symphysis pubis and sacroiliac joints appear intact. There is no evidence of proximal femur fracture or dislocation.   There is some edema within the left proximal thigh adductor musculature. In addition, there is a edema and a probable focal hematoma inferiorly in the left gluteus maximus muscle. There is generalized subcutaneous edema in the pelvis and proximal thighs.   IMPRESSION: 1. Nondisplaced fractures of the left superior and inferior pubic rami. Although dating is limited by the artifact from the total hip arthroplasties, the adjacent soft tissue edema implies acuity. 2. Presumed focal hematoma in the left gluteus maximus muscle posterior to the ischium.   CT HEAD WITHOUT CONTRAST   CT CERVICAL SPINE WITHOUT CONTRAST   TECHNIQUE: Contiguous axial images were obtained from the base of the skull through the vertex without intravenous contrast. Routine helical imaging was performed through the cervical spine without intravenous contrast. Sagittal and coronal reconstructed images were performed and reviewed.   COMPARISON:  08/17/2013   FINDINGS: CT head:   Old infarct in the right frontal lobe with encephalomalacia, stable. Extensive chronic microvascular changes throughout the deep white matter. No acute infarction. No hemorrhage or hydrocephalus. No extra-axial fluid collection. No acute calvarial abnormality. Visualized paranasal sinuses and mastoids clear. Orbital soft tissues unremarkable.   CT cervical:   Diffuse  degenerative disc and facet disease. Solid bony fusion from C5-C7. Prevertebral soft tissues are normal. Alignment is normal. No fracture. No epidural or paraspinal hematoma.   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   Atrophy, chronic microvascular disease.   No acute intracranial abnormality.   No acute bony abnormality within the cervical spine.   CT HEAD WITHOUT CONTRAST   CT CERVICAL SPINE WITHOUT CONTRAST   TECHNIQUE: Contiguous axial images were obtained from the base of the skull through the vertex without intravenous contrast. Routine helical imaging was performed through the cervical spine without intravenous contrast. Sagittal and coronal reconstructed images were performed and reviewed.   COMPARISON:  08/17/2013   FINDINGS: CT head:   Old infarct in the right frontal lobe with encephalomalacia, stable. Extensive chronic microvascular changes throughout the deep  white matter. No acute infarction. No hemorrhage or hydrocephalus. No extra-axial fluid collection. No acute calvarial abnormality. Visualized paranasal sinuses and mastoids clear. Orbital soft tissues unremarkable.   CT cervical:   Diffuse degenerative disc and facet disease. Solid bony fusion from C5-C7. Prevertebral soft tissues are normal. Alignment is normal. No fracture. No epidural or paraspinal hematoma.   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   Atrophy, chronic microvascular disease.   No acute intracranial abnormality.   No acute bony abnormality within the cervical spine.   RIGHT HIP - COMPLETE 2+ VIEW   COMPARISON:  Acute abdominal series 07/27/2010   FINDINGS: Bilateral hip replacements are noted. There is left protrusio acetabula I. No hardware complicating feature otherwise. No fracture, subluxation or dislocation.   IMPRESSION: No acute bony abnormality.   LEFT HIP - COMPLETE 2+ VIEW   COMPARISON:  08/17/2013    FINDINGS: Left total hip arthroplasty. Components are unchanged since previous study. Again demonstrated are fractures of the superior and inferior pubic rami on the left. No significant change since previous study.   IMPRESSION: Left hip arthroplasty without change. Left pubic rami fractures also unchanged.     CHEST - 1 VIEW   COMPARISON:  07/27/2010   FINDINGS: Mild cardiac enlargement. Calcification in the mitral valve annulus. Pulmonary vascularity is normal. Peribronchial thickening suggesting chronic bronchitis. No focal airspace disease or consolidation in the lungs. Surgical clips in the right axilla. Surgical clips in the base the neck. Similar appears to previous study.   IMPRESSION: No active disease. MRI LUMBAR SPINE WITHOUT CONTRAST   TECHNIQUE: Multiplanar, multisequence MR imaging was performed. No intravenous contrast was administered.   COMPARISON:  None.   FINDINGS: The patient has bilateral sacral ala fractures with associated marrow edema. There is a mild, remote superior endplate compression fracture of L5. Vertebral body height is otherwise maintained. 0.3 cm anterolisthesis L4 on L5 is identified. There is trace retrolisthesis of L2 on L3. The conus medullaris is normal in signal and position. Imaged intra-abdominal contents demonstrate T2 hyperintense lesions in the kidneys consistent with cysts.   The T11-12 level is imaged in the sagittal plane only and negative.   T12-L1:  Negative.   L1-2: Tiny right paracentral protrusion without central canal or foraminal narrowing.   L2-3: There is ligamentum flavum thickening. Disc bulge and endplate spur are identified. Moderately severe central canal stenosis is present. Mild to moderate bilateral foraminal narrowing is identified.   L3-4: There is facet degenerative change and a mild disc bulge. Advanced facet arthropathy is identified and there is a shallow disc bulge. Moderate to moderately  severe central canal stenosis is present. Foramina are open.   L4-5: The patient is status post posterior decompression. The central canal is decompressed. Foramina are open.   L5-S1: Status post posterior decompression. The central canal and foramina are open.   IMPRESSION: The study is positive for acute or subacute bilateral sacral ala fractures.   Remote, mild superior endplate compression fracture of L5.   Degenerative disease appearing worst at L2-3 where there is moderate to moderately severe central canal stenosis.    ASSESSMENT/PLAN:  Left pelvic fracture-continue rehabilitation Hypertension-well-controlled Hypothyroidism-continue levothyroxine Rheumatoid arthritis-denies pain  I have reviewed patient's medical records received at admission/from hospitalization.  CPT CODE: 28786  Gayani Y Dasanayaka, [place] [phone]

## 2013-09-05 ENCOUNTER — Other Ambulatory Visit: Payer: Self-pay | Admitting: *Deleted

## 2013-09-05 MED ORDER — HYDROMORPHONE HCL 2 MG PO TABS: ORAL_TABLET | ORAL | Status: DC

## 2013-09-05 NOTE — Telephone Encounter (Signed)
rx faxed to [facility] @ [phone]. 

## 2013-09-22 ENCOUNTER — Other Ambulatory Visit: Payer: Self-pay | Admitting: *Deleted

## 2013-09-22 MED ORDER — HYDROMORPHONE HCL 2 MG PO TABS: ORAL_TABLET | ORAL | Status: DC

## 2013-09-22 NOTE — Telephone Encounter (Signed)
[facility] 

## 2013-10-05 ENCOUNTER — Non-Acute Institutional Stay (SKILLED_NURSING_FACILITY): Payer: 59 | Admitting: Adult Health

## 2013-10-05 ENCOUNTER — Encounter: Payer: Self-pay | Admitting: Adult Health

## 2013-10-05 DIAGNOSIS — I10 Essential (primary) hypertension: Secondary | ICD-10-CM

## 2013-10-05 DIAGNOSIS — F32A Depression, unspecified: Secondary | ICD-10-CM

## 2013-10-05 DIAGNOSIS — F419 Anxiety disorder, unspecified: Secondary | ICD-10-CM | POA: Insufficient documentation

## 2013-10-05 DIAGNOSIS — M8448XA Pathological fracture, other site, initial encounter for fracture: Secondary | ICD-10-CM

## 2013-10-05 DIAGNOSIS — M069 Rheumatoid arthritis, unspecified: Secondary | ICD-10-CM

## 2013-10-05 DIAGNOSIS — G252 Other specified forms of tremor: Secondary | ICD-10-CM

## 2013-10-05 DIAGNOSIS — E039 Hypothyroidism, unspecified: Secondary | ICD-10-CM

## 2013-10-05 DIAGNOSIS — F411 Generalized anxiety disorder: Secondary | ICD-10-CM

## 2013-10-05 DIAGNOSIS — G250 Essential tremor: Secondary | ICD-10-CM

## 2013-10-05 DIAGNOSIS — F3289 Other specified depressive episodes: Secondary | ICD-10-CM

## 2013-10-05 DIAGNOSIS — F329 Major depressive disorder, single episode, unspecified: Secondary | ICD-10-CM | POA: Insufficient documentation

## 2013-10-05 NOTE — Progress Notes (Signed)
Patient ID: [name], female   DOB: 03/31/1934, 80 y.o.   MRN: [id number]              PROGRESS NOTE  DATE: 10/05/2013   FACILITY: [facility]  LEVEL OF CARE: SNF (31)  Acute Visit  CHIEF COMPLAINT:  Discharge Notes  HISTORY OF PRESENT ILLNESS: This is a 80 year old female who is for discharge to an [facility]. She was admitted to [facility] on 08/26/13 from [facility]. She had a fall and sustained bilateral sacral fractures. Conservative managament was recommended by orthopedics. Patient was admitted to this facility for short-term rehabilitation after the patient's recent hospitalization.  Patient has completed SNF rehabilitation and therapy has cleared the patient for discharge.  Reassessment of ongoing problem(s):  HTN: Pt 's HTN remains stable.  Denies CP, sob, DOE, pedal edema, headaches, dizziness or visual disturbances.  No complications from the medications currently being used.  Last BP : 120/69  DEPRESSION: The depression remains stable. Patient denies ongoing feelings of sadness, insomnia, anedhonia or lack of appetite. No complications reported from the medications currently being used. Staff do not report behavioral problems.  ANXIETY: The anxiety remains stable. Patient denies ongoing anxiety or irritability. No complications reported from the medications currently being used.  PAST MEDICAL HISTORY : Reviewed.  No changes/see problem list  CURRENT MEDICATIONS: Reviewed per MAR/see medication list  REVIEW OF SYSTEMS:  GENERAL: no change in appetite, no fatigue, no weight changes, no fever, chills or weakness RESPIRATORY: no cough, SOB, DOE, wheezing, hemoptysis CARDIAC: no chest pain, edema or palpitations GI: no abdominal pain, diarrhea, constipation, heart burn, nausea or vomiting  PHYSICAL EXAMINATION  GENERAL: no acute distress, normal body habitus EYES: conjunctivae normal, sclerae normal, normal eye lids NECK:  supple, trachea midline, no neck masses, no thyroid tenderness, no thyromegaly LYMPHATICS: no LAN in the neck, no supraclavicular LAN RESPIRATORY: breathing is even & unlabored, BS CTAB CARDIAC: RRR, no murmur,no extra heart sounds, no edema GI: abdomen soft, normal BS, no masses, no tenderness, no hepatomegaly, no splenomegaly EXTREMITIES: able to move all 4 extremities   PSYCHIATRIC: the patient is alert & oriented to person, affect & behavior appropriate  LABS/RADIOLOGY: Labs reviewed: Basic Metabolic Panel:  Recent Labs  08/23/13 2109 08/24/13 0700 08/25/13 1047  NA 132* 141 137  K 4.2 3.3* 4.2  CL 94* 111 97  CO2 25 19 26   GLUCOSE 113* 81 117*  BUN 19 12 10   CREATININE 0.64 0.46* 0.57  CALCIUM 8.7 6.2* 8.6   Liver Function Tests:  Recent Labs  10/15/12 1308 10/29/12 1414  AST 22 21  ALT 17 12  ALKPHOS 63 70  BILITOT 0.3 0.38  PROT 6.2 6.1*  ALBUMIN  --  3.5   CBC:  Recent Labs  10/29/12 1414 08/17/13 1330  08/23/13 2109 08/24/13 0700 08/25/13 1047  WBC 6.6 6.0  < > 7.5 4.5 6.3  NEUTROABS 4.8 4.8  --  5.8  --   --   HGB 12.6 13.4  < > 12.7 9.4* 13.0  HCT 36.7 39.8  < > 37.9 28.3* 39.2  MCV 98.6 98.0  < > 97.7 97.9 97.3  PLT 114* 104*  < > 142* 111* 168  < > = values in this interval not displayed.   ASSESSMENT/PLAN:  Bilateral sacral fracture - conservative management; done with rehabilitation Sacral pain - stable; continue Dilaudid Hypertension - well-controlled; continue Tenormin Hypothyroidism - continue Synthroid Depression - stable; continue  Zoloft Anxiety - stable; continue Ativan Essential tremor - stable; continue Mysoline Rheumatoid arthritis - continue Deltasone   I have filled out patient's discharge paperwork and written prescriptions.    Total discharge time: Less than 30 minutes Discharge time involved coordination of the discharge process with Education officer, museum, nursing staff and therapy department.   CPT CODE: 37106  Seth Bake - NP [facility] [phone]

## 2013-10-06 ENCOUNTER — Encounter: Payer: Self-pay | Admitting: *Deleted

## 2013-10-15 ENCOUNTER — Ambulatory Visit: Payer: Self-pay | Admitting: Neurology

## 2013-10-28 ENCOUNTER — Ambulatory Visit: Payer: Medicare Other | Admitting: Neurology

## 2013-11-05 ENCOUNTER — Other Ambulatory Visit (HOSPITAL_COMMUNITY): Payer: Medicare Other

## 2013-11-06 ENCOUNTER — Other Ambulatory Visit: Payer: Self-pay | Admitting: *Deleted

## 2013-11-06 DIAGNOSIS — C50919 Malignant neoplasm of unspecified site of unspecified female breast: Secondary | ICD-10-CM

## 2013-11-10 ENCOUNTER — Other Ambulatory Visit: Payer: Medicare Other

## 2013-11-10 ENCOUNTER — Telehealth: Payer: Self-pay | Admitting: Internal Medicine

## 2013-11-10 NOTE — Telephone Encounter (Signed)
pt sick and will have son call back to r/s

## 2013-11-11 ENCOUNTER — Ambulatory Visit: Payer: Medicare Other | Admitting: Internal Medicine

## 2013-11-27 ENCOUNTER — Ambulatory Visit (INDEPENDENT_AMBULATORY_CARE_PROVIDER_SITE_OTHER): Payer: Medicare Other | Admitting: Neurology

## 2013-11-27 ENCOUNTER — Encounter: Payer: Self-pay | Admitting: Neurology

## 2013-11-27 VITALS — BP 122/75 | HR 73 | Wt 132.0 lb

## 2013-11-27 DIAGNOSIS — G252 Other specified forms of tremor: Secondary | ICD-10-CM

## 2013-11-27 DIAGNOSIS — R269 Unspecified abnormalities of gait and mobility: Secondary | ICD-10-CM

## 2013-11-27 DIAGNOSIS — G250 Essential tremor: Secondary | ICD-10-CM

## 2013-11-27 MED ORDER — TOPIRAMATE 25 MG PO TABS: 25.0000 mg | ORAL_TABLET | Freq: Every morning | ORAL | Status: DC

## 2013-11-27 NOTE — Progress Notes (Signed)
Reason for visit: Essential tremor  [name] is an 80 y.o. female  History of present illness:  [name] is a 80 year old right-handed white female with a history of an essential tremor associated with a gait disorder. The patient fell in April 2015, and she fractured her pelvis. The patient has required some physical therapy following this. She is walking with a walker, and she reports no further falls. When last seen 6 months ago, she was found to have significant lethargy and drowsiness on the Mysoline which was being used for this type of tremor. This dose was cut back taking 125 mg twice daily, but the tremor significantly increased. A 50 mg dose was added to the evening medications, and she has gained some improvement, but the tremors remain significant. The patient reports some significant issues with feeding herself, but the husband indicates that she does relatively well. She is walking quite slowly with a walker. More recently, she has had some problems with diarrhea. She is not requiring any further pain medication. She returns for an evaluation.  Past Medical History  Diagnosis Date  . PE (pulmonary embolism)   . Subarachnoid hemorrhage   . Spastic hemiplegia affecting nondominant side   . Intracerebral hemorrhage   . Hypertension   . Arthritis   . Stroke 3/12  . Gastrointestinal problem   . Depression   . Breast CA     mastectomy  . Colon cancer   . Tremor     benign essential  . Rheumatoid arthritis(714.0)   . Hypothyroidism   . Dysrhythmia     palpitation  . Lymphedema of arm     right  . Atrophic vaginitis   . Osteoporosis   . Multinodular goiter   . Menieres disease 2002  . Thrombocytopenia 2002  . Gastroparesis 7/03  . Colon cancer 2007  . Pulmonary embolism 2003    chronic coumadin  . CVA (cerebral vascular accident)     Past Surgical History  Procedure Laterality Date  . Fracture surgery  2010    rt wrist  . Mastectomy      bilat  mastectomies  . Breast surgery    . Joint replacement      Bilateral total hip replacement  . Hardware removal  03/29/2012    Procedure: HARDWARE REMOVAL;  Surgeon: Alta Corning, MD;  Location: [facility];  Service: Orthopedics;  Laterality: Left;  Screw Removal Left Foot  . Shoulder arthroscopy Right   . Thyroidectomy    . Total hip arthroplasty    . Cholecystectomy    . Tonsillectomy    . Left ankle      ruptured achilles tendon  . Carpal tunnel release Bilateral   . Thoracic outlet surgery Bilateral   . Ulnar nerve transposition Right   . Knee surgery Bilateral   . Lumbar laminectomy    . Nasal sinus surgery    . Cervical laminectomy    . Bunionectomy Right   . Right wrist    . Squamous cell carcinoma resec      Family History  Problem Relation Age of Onset  . Cancer Father   . Heart failure Sister   . Tremor Maternal Grandmother   . Tremor Maternal Grandfather   . Tremor Paternal Grandmother   . Tremor Paternal Grandfather     Social history:  reports that she quit smoking about 43 years ago. She has never used smokeless tobacco. She reports that she drinks alcohol.  She reports that she does not use illicit drugs.    Allergies  Allergen Reactions  . Demerol   . Fentanyl   . Metoclopramide Other (See Comments)    crazy  . Metoclopramide Hcl   . Morphine And Related   . Nsaids     Upset stomach  . Oxycodone-Acetaminophen     Other reaction(s): Hallucinations  . Pentazocine     Other reaction(s): Hallucinations  . Pentazocine Lactate   . Versed [Midazolam] Other (See Comments)    Medications:  Current Outpatient Prescriptions on File Prior to Visit  Medication Sig Dispense Refill  . acetaminophen (TYLENOL) 325 MG tablet Take 2 tablets (650 mg total) by mouth every 6 (six) hours as needed for mild pain (or Fever >/= 101).      Marland Kitchen atenolol (TENORMIN) 25 MG tablet Take 25 mg by mouth daily.      . calcium carbonate (TUMS - DOSED IN MG  ELEMENTAL CALCIUM) 500 MG chewable tablet Chew 1 tablet by mouth 4 (four) times daily.      . Carboxymethylcellul-Glycerin (REFRESH OPTIVE OP) Apply 1-2 drops to eye.       . carboxymethylcellulose (REFRESH) 1 % ophthalmic solution Apply 1-2 drops to eye at bedtime.       . Cholecalciferol (VITAMIN D) 2000 UNITS CAPS Take 1 capsule by mouth daily.      . colestipol (COLESTID) 1 G tablet Take 2 g by mouth 3 (three) times daily with meals.      . enoxaparin (LOVENOX) 30 MG/0.3ML injection Inject 0.3 mLs (30 mg total) into the skin daily. For 4 weeks, or until ambulatory  0 Syringe    . HYDROmorphone (DILAUDID) 2 MG tablet Take one tablet by mouth at 6am scheduled; Take one tablet by mouth every 4 hours as needed for pain  210 tablet  0  . hydroxychloroquine (PLAQUENIL) 200 MG tablet Take 1 tablet by mouth every 4 hours as needed for pain      . LACTASE PO Take 2 tablets by mouth 3 (three) times daily with meals.       Marland Kitchen levothyroxine (SYNTHROID, LEVOTHROID) 200 MCG tablet Take 400 mcg by mouth daily before breakfast.       . LORazepam (ATIVAN) 0.5 MG tablet Take 1 tablet (0.5 mg total) by mouth 2 (two) times daily. Hold for sedation  30 tablet  0  . magnesium hydroxide (MILK OF MAGNESIA) 400 MG/5ML suspension Take 30 mLs by mouth daily as needed for mild constipation.  360 mL  0  . ondansetron (ZOFRAN) 4 MG tablet Take 1 tablet (4 mg total) by mouth every 6 (six) hours as needed for nausea.  20 tablet  0  . oxyCODONE-acetaminophen (PERCOCET/ROXICET) 5-325 MG per tablet Take 1-2 tablets by mouth every 4 (four) hours as needed for severe pain.  30 tablet  0  . Oyster Shell (OYSTER CALCIUM) 500 MG TABS tablet Take 500 mg of elemental calcium by mouth daily.       . predniSONE (DELTASONE) 5 MG tablet Take 5 mg by mouth daily with breakfast.       . primidone (MYSOLINE) 250 MG tablet Take 125 mg by mouth 2 (two) times daily.       . primidone (MYSOLINE) 50 MG tablet Take 1 tablet (50 mg total) by mouth at  bedtime.  30 tablet  6  . senna-docusate (SENOKOT-S) 8.6-50 MG per tablet Take 1 tablet by mouth 2 (two) times daily.      Marland Kitchen  sertraline (ZOLOFT) 100 MG tablet Take 100 mg by mouth daily.       . vitamin C (ASCORBIC ACID) 500 MG tablet Take 500 mg by mouth daily.       No current facility-administered medications on file prior to visit.    ROS:  Out of a complete 14 system review of symptoms, the patient complains only of the following symptoms, and all other reviewed systems are negative.  Diarrhea, incontinence of bowel Frequent waking, daytime sleepiness, snoring Incontinence of bladder, urinary urgency Joint pain, achy muscles, walking difficulties, neck pain, neck stiffness Moles Memory loss, dizziness, headache, numbness, speech difficulty, weakness, tremors Agitation, confusion, decreased concentration, anxiety  Blood pressure 122/75, pulse 73, weight 132 lb (59.875 kg), last menstrual period 06/08/1986.  Physical Exam  General: The patient is alert and cooperative at the time of the examination.  Skin: 1+ edema is noted in the ankles bilaterally.   Neurologic Exam  Mental status: The Mini-Mental status examination done today shows a total score of 19/30.  Cranial nerves: Facial symmetry is present. Speech is normal, no aphasia or dysarthria is noted. Extraocular movements are full. Visual fields are full.  Motor: The patient has good strength in all 4 extremities.  Sensory examination: Soft touch sensation is symmetric on the face, arms, or legs.  Coordination: The patient has good finger-nose-finger and heel-to-shin bilaterally. The patient does have tremors with finger-nose-finger bilaterally.  Gait and station: The patient has an unsteady gait, feet are relatively close together. The patient walks very slowly and deliberately with a walker. Tandem gait was not attempted. Romberg is negative. No drift is seen.  Reflexes: Deep tendon reflexes are symmetric, but are  depressed.   Assessment/Plan:  1. Benign essential tremor  2. Gait disorder  3. Memory disorder  The patient will be given a brief trial on Topamax in low dose to see if this helps the tremor. She will contact our office if there are side effects associated with this medication. The patient will continue the Mysoline taking 125 mg in the morning and 175 mg in the evening. The patient will followup through this office in about 6 months.    Jill Alexanders MD 11/27/2013 7:28 PM  [facility] [address] [place], [place] [address]  Phone [phone] Fax [phone]

## 2013-11-27 NOTE — Patient Instructions (Signed)

## 2014-01-01 LAB — TSH: TSH: 16.7 u[IU]/mL — AB (ref <=5.90)

## 2014-01-29 ENCOUNTER — Encounter: Payer: Self-pay | Admitting: Endocrinology

## 2014-01-29 ENCOUNTER — Ambulatory Visit (INDEPENDENT_AMBULATORY_CARE_PROVIDER_SITE_OTHER): Payer: Medicare Other | Admitting: Endocrinology

## 2014-01-29 VITALS — BP 118/64 | HR 72 | Temp 98.4°F | Ht 65.0 in | Wt 127.0 lb

## 2014-01-29 DIAGNOSIS — E890 Postprocedural hypothyroidism: Secondary | ICD-10-CM | POA: Insufficient documentation

## 2014-01-29 NOTE — Progress Notes (Signed)
Patient ID: [name], female   DOB: 06-27-1933, 80 y.o.   MRN: [id number]   Reason for Appointment:  Hypothyroidism, new visit   History of Present Illness:   The Hyothyroidism was first diagnosed in 1998 after her thyroidectomy She does not know why she had thyroidectomy and no records are available. Does not think it was for hyperthyroidism  Her previous treatment history is not available but apparently she has required very large doses of levothyroxine for several years In 2012 she was taking 300 mcg alternating with 400 mcg Prior TSH levels are not available Her TSH was high in 4/15 and her dose was continued at 400 mcg daily He apparently has been taking colestipol for her postcholecystectomy diarrhea since her surgery in 1977 and takes this with each meal including at breakfast. Since she has been in the nursing home for about a year she is also getting calcium supplements in the form of TUMS and calcium carbonate with breakfast and is getting her thyroid supplement at breakfast also   The symptoms consistent with hypothyroidism are: fatigue, sleepiness, cold sensitivity, some difficulties with memory, dry skin and some hoarseness  In 8/15 apparently her dosage was increased by 25 mcg and her TSH was 16.7 but she does not feel any better           Lab Results  Component Value Date   TSH 16.70* 01/01/2014   TSH 29.160* 08/24/2013      Past Medical History  Diagnosis Date  . PE (pulmonary embolism)   . Subarachnoid hemorrhage   . Spastic hemiplegia affecting nondominant side   . Intracerebral hemorrhage   . Hypertension   . Arthritis   . Stroke 3/12  . Gastrointestinal problem   . Depression   . Breast CA     mastectomy  . Colon cancer   . Tremor     benign essential  . Rheumatoid arthritis(714.0)   . Hypothyroidism   . Dysrhythmia     palpitation  . Lymphedema of arm     right  . Atrophic vaginitis   . Osteoporosis   . Multinodular goiter   . Menieres  disease 2002  . Thrombocytopenia 2002  . Gastroparesis 7/03  . Colon cancer 2007  . Pulmonary embolism 2003    chronic coumadin  . CVA (cerebral vascular accident)     Past Surgical History  Procedure Laterality Date  . Fracture surgery  2010    rt wrist  . Mastectomy      bilat mastectomies  . Breast surgery    . Joint replacement      Bilateral total hip replacement  . Hardware removal  03/29/2012    Procedure: HARDWARE REMOVAL;  Surgeon: Alta Corning, MD;  Location: [facility];  Service: Orthopedics;  Laterality: Left;  Screw Removal Left Foot  . Shoulder arthroscopy Right   . Thyroidectomy    . Total hip arthroplasty    . Cholecystectomy    . Tonsillectomy    . Left ankle      ruptured achilles tendon  . Carpal tunnel release Bilateral   . Thoracic outlet surgery Bilateral   . Ulnar nerve transposition Right   . Knee surgery Bilateral   . Lumbar laminectomy    . Nasal sinus surgery    . Cervical laminectomy    . Bunionectomy Right   . Right wrist    . Squamous cell carcinoma resec      Family History  Problem  Relation Age of Onset  . Cancer Father   . Heart failure Sister   . Tremor Maternal Grandmother   . Tremor Maternal Grandfather   . Tremor Paternal Grandmother   . Tremor Paternal Grandfather     Social History:  reports that she quit smoking about 43 years ago. She has never used smokeless tobacco. She reports that she drinks alcohol. She reports that she does not use illicit drugs.  Allergies:  Allergies  Allergen Reactions  . Demerol   . Fentanyl   . Metoclopramide Other (See Comments)    crazy  . Metoclopramide Hcl   . Morphine And Related   . Nsaids     Upset stomach  . Oxycodone-Acetaminophen     Other reaction(s): Hallucinations  . Pentazocine     Other reaction(s): Hallucinations  . Pentazocine Lactate   . Versed [Midazolam] Other (See Comments)      Medication List       This list is accurate as of: 01/29/14   9:25 PM.  Always use your most recent med list.               albuterol 108 (90 BASE) MCG/ACT inhaler  Commonly known as:  PROVENTIL HFA;VENTOLIN HFA  Inhale 2 puffs into the lungs every 6 (six) hours as needed for wheezing or shortness of breath.     aspirin 81 MG tablet  Take 81 mg by mouth daily.     atenolol 25 MG tablet  Commonly known as:  TENORMIN  Take 25 mg by mouth daily.     hydroxychloroquine 200 MG tablet  Commonly known as:  PLAQUENIL  Take 1 tablet by mouth every 4 hours as needed for pain     LACTASE PO  Take 2 tablets by mouth 3 (three) times daily with meals.     levothyroxine 200 MCG tablet  Commonly known as:  SYNTHROID, LEVOTHROID  Take 400 mcg by mouth daily before breakfast.     LORazepam 0.5 MG tablet  Commonly known as:  ATIVAN  Take 1 tablet (0.5 mg total) by mouth 2 (two) times daily. Hold for sedation     predniSONE 5 MG tablet  Commonly known as:  DELTASONE  Take 5 mg by mouth daily with breakfast.     primidone 250 MG tablet  Commonly known as:  MYSOLINE  Take 125 mg by mouth 2 (two) times daily.     primidone 50 MG tablet  Commonly known as:  MYSOLINE  Take 1 tablet (50 mg total) by mouth at bedtime.     REFRESH 1 % ophthalmic solution  Generic drug:  carboxymethylcellulose  Apply 1-2 drops to eye at bedtime.     REFRESH OPTIVE OP  Apply 1-2 drops to eye.     senna-docusate 8.6-50 MG per tablet  Commonly known as:  Senokot-S  Take 1 tablet by mouth 2 (two) times daily.     sertraline 100 MG tablet  Commonly known as:  ZOLOFT  Take 100 mg by mouth daily.     topiramate 25 MG tablet  Commonly known as:  TOPAMAX  Take 1 tablet (25 mg total) by mouth every morning.     vitamin C 500 MG tablet  Commonly known as:  ASCORBIC ACID  Take 500 mg by mouth daily.     Vitamin D 2000 UNITS Caps  Take 1 capsule by mouth daily.     VOLTAREN 1 % Gel  Generic drug:  diclofenac sodium  Place 1  application onto the skin as needed.         Review of Systems:  She has had a very long past history and numerous medical problems  CARDIOLOGY:  she has  history of high blood pressure but currently only on 25 mg of atenolol           GASTROENTEROLOGY: Diarrhea after cholecystectomy    ENDOCRINOLOGY:  no history of Diabetes.  Followed by neurologist for tremor, gait imbalance       History of pelvic fracture requiring admission in 4/15, now healed She uses a walker or wheelchair to get around  History of depression, on treatment  She has persistent problems with right arm lymphedema secondary to her radical mastectomy         Examination:    BP 118/64  Pulse 72  Temp(Src) 98.4 F (36.9 C) (Oral)  Ht 5\' 5"  (1.651 m)  Wt 127 lb (57.607 kg)  BMI 21.13 kg/m2  SpO2 98%  LMP 06/08/1986   General Appearance: pleasant, alert, fully communicative, mildly asthenic looking          Eyes: No proptosis or eyelid swelling.          ENT: Tongue and oral mucosa looked normal Neck: The thyroid is not palpable. There is no lymphadenopathy .    Cardiovascular: Normal  heart sounds. She has a 3/6 ejection murmur at the left sternal border Respiratory:  Lungs clear Gastrointestinal: abdomen soft, nondistended     Neurological: Speech is normal. REFLEXES: at biceps appears slightly brisk on the right side, difficult to elicit on the left  Extremities: She has swelling of her right forearm and some of the upper arm, no pedal edema      Skin: moist, slightly cool, no rash or excessive dryness      Assessment  Hypothyroidism, postsurgical, historically has been on unusually large doses of levothyroxine, now taking 425 mcg This is indicating very poor absorption of the levothyroxine considering her weight of only 127 pounds She is taking significant amount of interacting substances with her levothyroxine at breakfast including Colestid and large amounts of calcium Most likely the lack of absorption is related to drug interaction in  the GI tract She appears to be somewhat symptomatic from the hypothyroidism also  Multiple other medical problems, chronic and stable Her multiple records including hospital, nursing home and labs were obtained and reviewed today  Treatment:  Will need to remove all her interacting medications that she is taking at breakfast with her thyroid supplements She will need to stop taking all calcium supplements and also the Colestid at breakfast This was discussed with her and her son today Detailed instructions given for the nursing home to follow She will take her levothyroxine 30 minutes before breakfast Since her absorption of levothyroxine should improve with at least stop her additional 25 mcg levothyroxine and continue 400 mcg Will check her TSH again in 4 weeks and adjust her dosage as needed Most likely she will need a considerably smaller amount of levothyroxine long-term  Will also followup in the office in November  [facility] 01/29/2014, 9:25 PM

## 2014-01-29 NOTE — Patient Instructions (Signed)
Stop the COLESTID at breakfast, may continue at lunch and dinner. May use OTC Imodium as needed if having diarrhea  Stop taking TUMS at breakfast  Stop taking oyster shell calcium at breakfast and may take it at dinnertime  Stop the extra 25 mcg levothyroxine tablet  LEVOTHYROXINE 200 mcg, take 2 tablets 30 minutes BEFORE breakfast  Please order TSH lab for hypothyroidism in 30 days and fax the report to me at [phone]

## 2014-02-27 ENCOUNTER — Other Ambulatory Visit: Payer: Self-pay | Admitting: Endocrinology

## 2014-03-02 ENCOUNTER — Encounter: Payer: Self-pay | Admitting: *Deleted

## 2014-03-03 LAB — SPECIMEN STATUS REPORT

## 2014-03-03 LAB — T4, FREE: FREE T4: 2.35 ng/dL — AB (ref 0.82–1.77)

## 2014-03-05 ENCOUNTER — Telehealth: Payer: Self-pay | Admitting: Endocrinology

## 2014-03-05 NOTE — Telephone Encounter (Signed)
LM for pt to call asap to get scheduled

## 2014-03-05 NOTE — Progress Notes (Signed)
Quick Note:  Thyroid level is very high, needs to be seen ASAP ______

## 2014-03-05 NOTE — Telephone Encounter (Signed)
Message copied by Armen Pickup on Thu Mar 05, 2014  5:25 PM ------      Message from: Elayne Snare      Created: Thu Mar 05, 2014  8:08 AM       Thyroid level is very high, needs to be seen ASAP ------

## 2014-03-09 ENCOUNTER — Encounter: Payer: Self-pay | Admitting: Endocrinology

## 2014-03-23 ENCOUNTER — Ambulatory Visit (INDEPENDENT_AMBULATORY_CARE_PROVIDER_SITE_OTHER): Payer: Medicare Other | Admitting: Endocrinology

## 2014-03-23 ENCOUNTER — Encounter: Payer: Self-pay | Admitting: Endocrinology

## 2014-03-23 VITALS — BP 100/60 | HR 68 | Temp 98.0°F | Resp 14 | Ht 65.0 in | Wt 128.6 lb

## 2014-03-23 DIAGNOSIS — E890 Postprocedural hypothyroidism: Secondary | ICD-10-CM

## 2014-03-23 NOTE — Progress Notes (Signed)
Patient ID: [name], female   DOB: 05-22-1933, 81 y.o.   MRN: [id number]   Reason for Appointment:  Hypothyroidism, follow-up visit   History of Present Illness:   HYPOTHYROIDISM was first diagnosed in 1998 after her thyroidectomy She does not know why she had thyroidectomy and no records are available. Does not think it was for hyperthyroidism Her previous treatment history is not available but apparently she has required very large doses of levothyroxine for several years In 2012 she was taking 300 mcg alternating with 400 mcg Her TSH was high in 4/15 and her dose was continued at 400 mcg daily  Previously had symptoms of hypothyroidism of: fatigue, sleepiness, cold sensitivity, some difficulties with memory, dry skin and some hoarseness  Recent history: She was referred here for evaluation of persistently high TSH. Since she had been taking colestipol for her postcholecystectomy diarrhea with each meal including at breakfast she was told to stop taking this in the morning. Also she was getting calcium supplements with her breakfast and his were changed to dinnertime  Since Baseline TSH was 16.7 her dose was reduced only slightly by 25 g after making the above changes With this change her TSH in 10/15 was down to 0.05 and her free T4 was significantly high at 2.35 However the patient does not think she feels any different, does not feel shaky or nervous.  Also does not complain of fatigue No recent weight change           Lab Results  Component Value Date   FREET4 2.35* 02/27/2014   TSH 16.70* 01/01/2014   TSH 29.160* 08/24/2013      Past Medical History  Diagnosis Date  . PE (pulmonary embolism)   . Subarachnoid hemorrhage   . Spastic hemiplegia affecting nondominant side   . Intracerebral hemorrhage   . Hypertension   . Arthritis   . Stroke 3/12  . Gastrointestinal problem   . Depression   . Breast CA     mastectomy  . Colon cancer   . Tremor     benign  essential  . Rheumatoid arthritis(714.0)   . Hypothyroidism   . Dysrhythmia     palpitation  . Lymphedema of arm     right  . Atrophic vaginitis   . Osteoporosis   . Multinodular goiter   . Menieres disease 2002  . Thrombocytopenia 2002  . Gastroparesis 7/03  . Colon cancer 2007  . Pulmonary embolism 2003    chronic coumadin  . CVA (cerebral vascular accident)     Past Surgical History  Procedure Laterality Date  . Fracture surgery  2010    rt wrist  . Mastectomy      bilat mastectomies  . Breast surgery    . Joint replacement      Bilateral total hip replacement  . Hardware removal  03/29/2012    Procedure: HARDWARE REMOVAL;  Surgeon: Alta Corning, MD;  Location: [place];  Service: Orthopedics;  Laterality: Left;  Screw Removal Left Foot  . Shoulder arthroscopy Right   . Thyroidectomy    . Total hip arthroplasty    . Cholecystectomy    . Tonsillectomy    . Left ankle      ruptured achilles tendon  . Carpal tunnel release Bilateral   . Thoracic outlet surgery Bilateral   . Ulnar nerve transposition Right   . Knee surgery Bilateral   . Lumbar laminectomy    . Nasal sinus surgery    .  Cervical laminectomy    . Bunionectomy Right   . Right wrist    . Squamous cell carcinoma resec      Family History  Problem Relation Age of Onset  . Cancer Father   . Heart failure Sister   . Tremor Maternal Grandmother   . Tremor Maternal Grandfather   . Tremor Paternal Grandmother   . Tremor Paternal Grandfather     Social History:  reports that she quit smoking about 44 years ago. She has never used smokeless tobacco. She reports that she drinks alcohol. She reports that she does not use illicit drugs.  Allergies:  Allergies  Allergen Reactions  . Demerol   . Fentanyl   . Metoclopramide Other (See Comments)    crazy  . Metoclopramide Hcl   . Morphine And Related   . Nsaids     Upset stomach  . Oxycodone-Acetaminophen     Other reaction(s):  Hallucinations  . Pentazocine     Other reaction(s): Hallucinations  . Pentazocine Lactate   . Versed [Midazolam] Other (See Comments)      Medication List       This list is accurate as of: 03/23/14  4:00 PM.  Always use your most recent med list.               albuterol 108 (90 BASE) MCG/ACT inhaler  Commonly known as:  PROVENTIL HFA;VENTOLIN HFA  Inhale 2 puffs into the lungs every 6 (six) hours as needed for wheezing or shortness of breath.     amoxicillin 500 MG capsule  Commonly known as:  AMOXIL     aspirin 81 MG tablet  Take 81 mg by mouth daily.     atenolol 25 MG tablet  Commonly known as:  TENORMIN  Take 25 mg by mouth daily.     FLUVIRIN Susp  Generic drug:  Influenza Vac Typ A&B Surf Ant     hydroxychloroquine 200 MG tablet  Commonly known as:  PLAQUENIL  Take 1 tablet by mouth every 4 hours as needed for pain     LACTASE PO  Take 2 tablets by mouth 3 (three) times daily with meals.     levothyroxine 25 MCG tablet  Commonly known as:  SYNTHROID, LEVOTHROID     levothyroxine 200 MCG tablet  Commonly known as:  SYNTHROID, LEVOTHROID  Take 400 mcg by mouth daily before breakfast.     LORazepam 0.5 MG tablet  Commonly known as:  ATIVAN  Take 1 tablet (0.5 mg total) by mouth 2 (two) times daily. Hold for sedation     colestipol 1 G tablet  Commonly known as:  COLESTID  Take 1 g by mouth 2 (two) times daily.     MICRONIZED COLESTIPOL HCL 1 G tablet  Generic drug:  colestipol     predniSONE 5 MG tablet  Commonly known as:  DELTASONE  Take 5 mg by mouth daily with breakfast.     primidone 250 MG tablet  Commonly known as:  MYSOLINE  Take 125 mg by mouth 2 (two) times daily.     primidone 50 MG tablet  Commonly known as:  MYSOLINE  Take 1 tablet (50 mg total) by mouth at bedtime.     REFRESH 1 % ophthalmic solution  Generic drug:  carboxymethylcellulose  Apply 1-2 drops to eye at bedtime.     REFRESH OPTIVE OP  Apply 1-2 drops to eye.      senna-docusate 8.6-50 MG per tablet  Commonly known  as:  Senokot-S  Take 1 tablet by mouth 2 (two) times daily.     sertraline 100 MG tablet  Commonly known as:  ZOLOFT  Take 100 mg by mouth daily.     topiramate 25 MG tablet  Commonly known as:  TOPAMAX  Take 1 tablet (25 mg total) by mouth every morning.     vitamin C 500 MG tablet  Commonly known as:  ASCORBIC ACID  Take 500 mg by mouth daily.     Vitamin D 2000 UNITS Caps  Take 1 capsule by mouth daily.     VOLTAREN 1 % Gel  Generic drug:  diclofenac sodium  Place 1 application onto the skin as needed.        Review of Systems:  She has had a very long past history and numerous medical problems  CARDIOLOGY:  she has  history of high blood pressure but currently only on 25 mg of atenolol            Followed by neurologist for tremor, gait imbalance       History of depression, on treatment          Examination:    BP 100/60 mmHg  Pulse 68  Temp(Src) 98 F (36.7 C)  Resp 14  Ht 5\' 5"  (1.651 m)  Wt 128 lb 9.6 oz (58.333 kg)  BMI 21.40 kg/m2  SpO2 96%  LMP 06/08/1986   General Appearance: pleasant, alert, fully communicative          REFLEXES: at biceps are difficult to elicit but appear normal, better elicited on the right side She has a coarse tremor of both hands when these are outstretched  Assessment  Hypothyroidism, postsurgical, historically has been on unusually large doses of levothyroxine She was clearly having a significant interaction of levothyroxine with her Colestid and calcium that she was taking in the mornings With taking the above at lunch or dinner instead of at breakfast her thyroid levels have increased significantly and she has mild iatrogenic hyperthyroidism now with the 400 g supplement She is however asymptomatic and does not show any tachycardia  Treatment:  Will need to try reducing her dose to 200 g for now She will have a TSH drawn at the nursing home in 6 weeks and  faxed  Will also followup in the office in 3 months  Durrell Barajas 03/23/2014, 4:00 PM

## 2014-03-24 ENCOUNTER — Other Ambulatory Visit: Payer: Self-pay | Admitting: Endocrinology

## 2014-03-24 NOTE — Telephone Encounter (Signed)
Patient is requesting a refill of Oysco, okay to send?

## 2014-03-25 ENCOUNTER — Encounter: Payer: Self-pay | Admitting: Neurology

## 2014-03-31 ENCOUNTER — Encounter: Payer: Self-pay | Admitting: Neurology

## 2014-05-08 NOTE — Telephone Encounter (Signed)
Refill had been denied Aug 2014. Cleaning out MD in basket.

## 2014-06-01 ENCOUNTER — Encounter: Payer: Self-pay | Admitting: Nurse Practitioner

## 2014-06-01 ENCOUNTER — Ambulatory Visit (INDEPENDENT_AMBULATORY_CARE_PROVIDER_SITE_OTHER): Payer: 59 | Admitting: Nurse Practitioner

## 2014-06-01 VITALS — BP 118/66 | HR 60 | Ht 65.0 in

## 2014-06-01 DIAGNOSIS — G250 Essential tremor: Secondary | ICD-10-CM

## 2014-06-01 DIAGNOSIS — R269 Unspecified abnormalities of gait and mobility: Secondary | ICD-10-CM

## 2014-06-01 NOTE — Progress Notes (Signed)
[facility]  PATIENT: [name] DOB: 06/12/1933   REASON FOR VISIT: Follow-up for tremors and gait disorder  HISTORY FROM: Patient and husband    HISTORY OF PRESENT ILLNESS:[name] is a 81 year old right-handed white female with a history of an essential tremor associated with a gait disorder. She was last seen by Dr. Jannifer Franklin 11/27/2013 The patient fell in April 2015, and she fractured her pelvis. The patient has required some physical therapy following this. She is walking with a walker, and she reports no further falls. She is continuing to get physical therapy 2 times a week. She remains on Mysoline for tremor taking 125 mg in the morning and 175 mg at bedtime .she reports that the increase in her bedtime dose at last visit was beneficial but she still has problems with her breakfast meal . The patient reports some significant issues with feeding herself, but the husband indicates that she does relatively well. She does require assistance for dressing. She is currently residing in an assisted living facility. She is walking quite slowly with a walker.  She is not requiring any further pain medication. She returns for an evaluation.   REVIEW OF SYSTEMS: Full 14 system review of systems performed and notable only for those listed, all others are neg:  Constitutional: neg  Cardiovascular: neg Ear/Nose/Throat: neg  Skin: neg Eyes: neg Respiratory: neg Gastroitestinal: Urinary frequency  Hematology/Lymphatic: neg  Endocrine: neg Musculoskeletal:neg Allergy/Immunology: neg Neurological: neg Psychiatric: neg Sleep : neg   ALLERGIES: Allergies  Allergen Reactions  . Demerol   . Fentanyl   . Metoclopramide Other (See Comments)    crazy  . Metoclopramide Hcl   . Morphine And Related   . Nsaids     Upset stomach  . Oxycodone-Acetaminophen     Other reaction(s): Hallucinations  . Pentazocine     Other reaction(s): Hallucinations  . Pentazocine  Lactate   . Versed [Midazolam] Other (See Comments)    HOME MEDICATIONS: Outpatient Prescriptions Prior to Visit  Medication Sig Dispense Refill  . albuterol (PROVENTIL HFA;VENTOLIN HFA) 108 (90 BASE) MCG/ACT inhaler Inhale 2 puffs into the lungs every 6 (six) hours as needed for wheezing or shortness of breath.    Marland Kitchen amoxicillin (AMOXIL) 500 MG capsule     . aspirin 81 MG tablet Take 81 mg by mouth daily.    Marland Kitchen atenolol (TENORMIN) 25 MG tablet Take 25 mg by mouth daily.    . Carboxymethylcellul-Glycerin (REFRESH OPTIVE OP) Apply 1-2 drops to eye.     . carboxymethylcellulose (REFRESH) 1 % ophthalmic solution Apply 1-2 drops to eye at bedtime.     . Cholecalciferol (VITAMIN D) 2000 UNITS CAPS Take 1 capsule by mouth daily.    . colestipol (COLESTID) 1 G tablet Take 1 g by mouth 2 (two) times daily.    . diclofenac sodium (VOLTAREN) 1 % GEL Place 1 application onto the skin as needed.    Marland Kitchen FLUVIRIN SUSP   0  . hydroxychloroquine (PLAQUENIL) 200 MG tablet Take 1 tablet by mouth every 4 hours as needed for pain    . LACTASE PO Take 2 tablets by mouth 3 (three) times daily with meals.     Marland Kitchen levothyroxine (SYNTHROID, LEVOTHROID) 200 MCG tablet Take 400 mcg by mouth daily before breakfast.     . LORazepam (ATIVAN) 0.5 MG tablet Take 1 tablet (0.5 mg total) by mouth 2 (two) times daily. Hold for sedation 30 tablet 0  . MICRONIZED COLESTIPOL HCL 1  G tablet     . predniSONE (DELTASONE) 5 MG tablet Take 5 mg by mouth daily with breakfast.     . primidone (MYSOLINE) 250 MG tablet Take 125 mg by mouth 2 (two) times daily.     . primidone (MYSOLINE) 50 MG tablet Take 1 tablet (50 mg total) by mouth at bedtime. 30 tablet 6  . senna-docusate (SENOKOT-S) 8.6-50 MG per tablet Take 1 tablet by mouth 2 (two) times daily.    . sertraline (ZOLOFT) 100 MG tablet Take 100 mg by mouth daily.     Marland Kitchen topiramate (TOPAMAX) 25 MG tablet Take 1 tablet (25 mg total) by mouth every morning. 30 tablet 3  . vitamin C  (ASCORBIC ACID) 500 MG tablet Take 500 mg by mouth daily.     No facility-administered medications prior to visit.    PAST MEDICAL HISTORY: Past Medical History  Diagnosis Date  . PE (pulmonary embolism)   . Subarachnoid hemorrhage   . Spastic hemiplegia affecting nondominant side   . Intracerebral hemorrhage   . Hypertension   . Arthritis   . Stroke 3/12  . Gastrointestinal problem   . Depression   . Breast CA     mastectomy  . Colon cancer   . Tremor     benign essential  . Rheumatoid arthritis(714.0)   . Hypothyroidism   . Dysrhythmia     palpitation  . Lymphedema of arm     right  . Atrophic vaginitis   . Osteoporosis   . Multinodular goiter   . Menieres disease 2002  . Thrombocytopenia 2002  . Gastroparesis 7/03  . Colon cancer 2007  . Pulmonary embolism 2003    chronic coumadin  . CVA (cerebral vascular accident)     PAST SURGICAL HISTORY: Past Surgical History  Procedure Laterality Date  . Fracture surgery  2010    rt wrist  . Mastectomy      bilat mastectomies  . Breast surgery    . Joint replacement      Bilateral total hip replacement  . Hardware removal  03/29/2012    Procedure: HARDWARE REMOVAL;  Surgeon: Alta Corning, MD;  Location: [place];  Service: Orthopedics;  Laterality: Left;  Screw Removal Left Foot  . Shoulder arthroscopy Right   . Thyroidectomy    . Total hip arthroplasty    . Cholecystectomy    . Tonsillectomy    . Left ankle      ruptured achilles tendon  . Carpal tunnel release Bilateral   . Thoracic outlet surgery Bilateral   . Ulnar nerve transposition Right   . Knee surgery Bilateral   . Lumbar laminectomy    . Nasal sinus surgery    . Cervical laminectomy    . Bunionectomy Right   . Right wrist    . Squamous cell carcinoma resec      FAMILY HISTORY: Family History  Problem Relation Age of Onset  . Cancer Father   . Heart failure Sister   . Tremor Maternal Grandmother   . Tremor Maternal  Grandfather   . Tremor Paternal Grandmother   . Tremor Paternal Grandfather     SOCIAL HISTORY: History   Social History  . Marital Status: Married    Spouse Name: N/A    Number of Children: 4  . Years of Education: college   Occupational History  . Retired    Social History Main Topics  . Smoking status: Former Smoker    Quit date:  02/25/1970  . Smokeless tobacco: Never Used  . Alcohol Use: 0.0 oz/week     Comment: occ wine  . Drug Use: No  . Sexual Activity: No   Other Topics Concern  . Not on file   Social History Narrative     PHYSICAL EXAM  Filed Vitals:   06/01/14 1430  BP: 118/66  Pulse: 60  Height: 5\' 5"  (1.651 m)   Body mass index is 0.00 kg/(m^2). General: The patient is alert and cooperative at the time of the examination. Skin: 1+ edema is noted in the ankles bilaterally. Neurologic Exam  Mental status: Alert oriented to time place and person. Follows all commands speech and language fluent  Cranial nerves: Facial symmetry is present. Speech is normal, no aphasia or dysarthria is noted. Extraocular movements are full. Visual fields are full.  Motor: The patient has good strength in all 4 extremities. No focal weakness  Sensory examination: Soft touch sensation is symmetric on the face, arms, or legs.  Coordination: The patient has good finger-nose-finger and heel-to-shin bilaterally. The patient does have tremors with finger-nose-finger bilaterally.  Gait and station: The patient has an unsteady gait, feet are relatively close together. The patient walks very slowly and deliberately with a walker. Tandem gait was not attempted. Romberg is negative. No drift is seen.  Reflexes: Deep tendon reflexes are symmetric, but are depressed.     DIAGNOSTIC DATA (LABS, IMAGING, TESTING) - I reviewed patient records, labs, notes, testing and imaging myself where available.  Lab Results  Component Value Date   WBC 6.3 08/25/2013   HGB 13.0 08/25/2013    HCT 39.2 08/25/2013   MCV 97.3 08/25/2013   PLT 168 08/25/2013      Component Value Date/Time   NA 137 08/25/2013 1047   NA 141 10/29/2012 1414   NA 139 10/15/2012 1308   K 4.2 08/25/2013 1047   K 3.7 10/29/2012 1414   CL 97 08/25/2013 1047   CL 103 10/29/2012 1414   CO2 26 08/25/2013 1047   CO2 29 10/29/2012 1414   GLUCOSE 117* 08/25/2013 1047   GLUCOSE 142* 10/29/2012 1414   GLUCOSE 129* 10/15/2012 1308   BUN 10 08/25/2013 1047   BUN 10.4 10/29/2012 1414   BUN 11 10/15/2012 1308   CREATININE 0.57 08/25/2013 1047   CREATININE 0.8 10/29/2012 1414   CALCIUM 8.6 08/25/2013 1047   CALCIUM 8.9 10/29/2012 1414   PROT 6.1* 10/29/2012 1414   PROT 6.2 10/15/2012 1308   PROT 5.5* 10/25/2011 1401   ALBUMIN 3.5 10/29/2012 1414   ALBUMIN 3.7 10/25/2011 1401   AST 21 10/29/2012 1414   AST 22 10/15/2012 1308   ALT 12 10/29/2012 1414   ALT 17 10/15/2012 1308   ALKPHOS 70 10/29/2012 1414   ALKPHOS 63 10/15/2012 1308   BILITOT 0.38 10/29/2012 1414   BILITOT 0.3 10/15/2012 1308   GFRNONAA 87* 08/25/2013 1047   GFRAA >90 08/25/2013 1047     Lab Results  Component Value Date   TSH 16.70* 01/01/2014    ASSESSMENT AND PLAN  81 y.o. year old female  has a past medical history of benign essential tremor and gait disorder. She is continuing to get physical therapy 2 times a week. She remains on Mysoline for her essential tremor but needs a dose increase  Increase Mysoline to 175mg  twice daily Continue physical therapy twice weekly Use walker at all times for safe ambulation Follow-up in 6 months Dennie Bible, [facility], [facility], APRN  [facility]  [place], [place], [place] [address] [phone]

## 2014-06-01 NOTE — Progress Notes (Signed)
I have read the note, and I agree with the clinical assessment and plan.  Metta Koranda KEITH   

## 2014-06-01 NOTE — Patient Instructions (Signed)
See skilled nursing sheet 

## 2014-06-09 ENCOUNTER — Encounter (HOSPITAL_COMMUNITY): Payer: Self-pay

## 2014-06-09 ENCOUNTER — Emergency Department (HOSPITAL_COMMUNITY)
Admission: EM | Admit: 2014-06-09 | Discharge: 2014-06-09 | Disposition: A | Discharge status: Home / Self Care | Payer: Medicare Other | Attending: Emergency Medicine | Admitting: Emergency Medicine

## 2014-06-09 DIAGNOSIS — Y92128 Other place in nursing home as the place of occurrence of the external cause: Secondary | ICD-10-CM | POA: Diagnosis not present

## 2014-06-09 DIAGNOSIS — Z8742 Personal history of other diseases of the female genital tract: Secondary | ICD-10-CM | POA: Diagnosis not present

## 2014-06-09 DIAGNOSIS — M1990 Unspecified osteoarthritis, unspecified site: Secondary | ICD-10-CM | POA: Insufficient documentation

## 2014-06-09 DIAGNOSIS — Z7952 Long term (current) use of systemic steroids: Secondary | ICD-10-CM | POA: Diagnosis not present

## 2014-06-09 DIAGNOSIS — M069 Rheumatoid arthritis, unspecified: Secondary | ICD-10-CM | POA: Diagnosis not present

## 2014-06-09 DIAGNOSIS — M810 Age-related osteoporosis without current pathological fracture: Secondary | ICD-10-CM | POA: Insufficient documentation

## 2014-06-09 DIAGNOSIS — Z85038 Personal history of other malignant neoplasm of large intestine: Secondary | ICD-10-CM | POA: Diagnosis not present

## 2014-06-09 DIAGNOSIS — S59902A Unspecified injury of left elbow, initial encounter: Secondary | ICD-10-CM | POA: Diagnosis present

## 2014-06-09 DIAGNOSIS — F329 Major depressive disorder, single episode, unspecified: Secondary | ICD-10-CM | POA: Insufficient documentation

## 2014-06-09 DIAGNOSIS — Z862 Personal history of diseases of the blood and blood-forming organs and certain disorders involving the immune mechanism: Secondary | ICD-10-CM | POA: Diagnosis not present

## 2014-06-09 DIAGNOSIS — Z7901 Long term (current) use of anticoagulants: Secondary | ICD-10-CM | POA: Insufficient documentation

## 2014-06-09 DIAGNOSIS — Z853 Personal history of malignant neoplasm of breast: Secondary | ICD-10-CM | POA: Diagnosis not present

## 2014-06-09 DIAGNOSIS — Z8673 Personal history of transient ischemic attack (TIA), and cerebral infarction without residual deficits: Secondary | ICD-10-CM | POA: Insufficient documentation

## 2014-06-09 DIAGNOSIS — Z87891 Personal history of nicotine dependence: Secondary | ICD-10-CM | POA: Diagnosis not present

## 2014-06-09 DIAGNOSIS — Z79899 Other long term (current) drug therapy: Secondary | ICD-10-CM | POA: Insufficient documentation

## 2014-06-09 DIAGNOSIS — Z86711 Personal history of pulmonary embolism: Secondary | ICD-10-CM | POA: Insufficient documentation

## 2014-06-09 DIAGNOSIS — E039 Hypothyroidism, unspecified: Secondary | ICD-10-CM | POA: Diagnosis not present

## 2014-06-09 DIAGNOSIS — S51002A Unspecified open wound of left elbow, initial encounter: Secondary | ICD-10-CM | POA: Diagnosis not present

## 2014-06-09 DIAGNOSIS — W01198A Fall on same level from slipping, tripping and stumbling with subsequent striking against other object, initial encounter: Secondary | ICD-10-CM | POA: Diagnosis not present

## 2014-06-09 DIAGNOSIS — Z7982 Long term (current) use of aspirin: Secondary | ICD-10-CM | POA: Insufficient documentation

## 2014-06-09 DIAGNOSIS — Y9301 Activity, walking, marching and hiking: Secondary | ICD-10-CM | POA: Diagnosis not present

## 2014-06-09 DIAGNOSIS — I10 Essential (primary) hypertension: Secondary | ICD-10-CM | POA: Insufficient documentation

## 2014-06-09 DIAGNOSIS — W19XXXA Unspecified fall, initial encounter: Secondary | ICD-10-CM

## 2014-06-09 DIAGNOSIS — Y998 Other external cause status: Secondary | ICD-10-CM | POA: Diagnosis not present

## 2014-06-09 DIAGNOSIS — Z8669 Personal history of other diseases of the nervous system and sense organs: Secondary | ICD-10-CM | POA: Insufficient documentation

## 2014-06-09 LAB — I-STAT CHEM 8, ED
BUN: 22 mg/dL (ref 6–23)
BUN: 23 mg/dL (ref 6–23)
CALCIUM ION: 1.11 mmol/L — AB (ref 1.13–1.30)
CHLORIDE: 105 mmol/L (ref 96–112)
CREATININE: 0.7 mg/dL (ref 0.50–1.10)
Calcium, Ion: 1.12 mmol/L — ABNORMAL LOW (ref 1.13–1.30)
Chloride: 105 mmol/L (ref 96–112)
Creatinine, Ser: 0.7 mg/dL (ref 0.50–1.10)
Glucose, Bld: 111 mg/dL — ABNORMAL HIGH (ref 70–99)
Glucose, Bld: 114 mg/dL — ABNORMAL HIGH (ref 70–99)
HCT: 37 % (ref 36.0–46.0)
HCT: 38 % (ref 36.0–46.0)
HEMOGLOBIN: 12.6 g/dL (ref 12.0–15.0)
Hemoglobin: 12.9 g/dL (ref 12.0–15.0)
Potassium: 3.6 mmol/L (ref 3.5–5.1)
Potassium: 3.7 mmol/L (ref 3.5–5.1)
SODIUM: 144 mmol/L (ref 135–145)
SODIUM: 143 mmol/L (ref 135–145)
TCO2: 23 mmol/L (ref 0–100)
TCO2: 23 mmol/L (ref 0–100)

## 2014-06-09 LAB — CBG MONITORING, ED: Glucose-Capillary: 101 mg/dL — ABNORMAL HIGH (ref 70–99)

## 2014-06-09 MED ORDER — BACITRACIN ZINC 500 UNIT/GM EX OINT
1.0000 "application " | TOPICAL_OINTMENT | Freq: Two times a day (BID) | CUTANEOUS | Status: DC
  Administered 2014-06-09: 1 via TOPICAL
  Filled 2014-06-09: qty 28.35

## 2014-06-09 NOTE — ED Provider Notes (Signed)
CSN: [id number]     Arrival date & time 06/09/14  0242 History   First MD Initiated Contact with Patient 06/09/14 [phone]     Chief Complaint  Patient presents with  . Fall  . Dizziness   Level V caveat patient disoriented  (Consider location/radiation/quality/duration/timing/severity/associated sxs/prior Treatment) Patient is a 81 y.o. female presenting with fall and dizziness.  Fall  Dizziness  Patient fell at assisted-living facility tonight and medially prior to coming here. She states that she is not using her walker. She became lightheaded for a few minutes and fell. Further history fromPJ McWhite, med tech at assisted-living facility: Patient was not using her walker when she fell. She was found on the floor several yards away from her walker. Patient is disoriented at baseline and would not know month. Struck her head as a result fall. Presently patient is without complaint and asymptomatic. Only injury noted was a skin tear at left elbow Past Medical History  Diagnosis Date  . PE (pulmonary embolism)   . Subarachnoid hemorrhage   . Spastic hemiplegia affecting nondominant side   . Intracerebral hemorrhage   . Hypertension   . Arthritis   . Stroke 3/12  . Gastrointestinal problem   . Depression   . Breast CA     mastectomy  . Colon cancer   . Tremor     benign essential  . Rheumatoid arthritis(714.0)   . Hypothyroidism   . Dysrhythmia     palpitation  . Lymphedema of arm     right  . Atrophic vaginitis   . Osteoporosis   . Multinodular goiter   . Menieres disease 2002  . Thrombocytopenia 2002  . Gastroparesis 7/03  . Colon cancer 2007  . Pulmonary embolism 2003    chronic coumadin  . CVA (cerebral vascular accident)    Past Surgical History  Procedure Laterality Date  . Fracture surgery  2010    rt wrist  . Mastectomy      bilat mastectomies  . Breast surgery    . Joint replacement      Bilateral total hip replacement  . Hardware removal  03/29/2012     Procedure: HARDWARE REMOVAL;  Surgeon: Alta Corning, MD;  Location: [facility];  Service: Orthopedics;  Laterality: Left;  Screw Removal Left Foot  . Shoulder arthroscopy Right   . Thyroidectomy    . Total hip arthroplasty    . Cholecystectomy    . Tonsillectomy    . Left ankle      ruptured achilles tendon  . Carpal tunnel release Bilateral   . Thoracic outlet surgery Bilateral   . Ulnar nerve transposition Right   . Knee surgery Bilateral   . Lumbar laminectomy    . Nasal sinus surgery    . Cervical laminectomy    . Bunionectomy Right   . Right wrist    . Squamous cell carcinoma resec     Family History  Problem Relation Age of Onset  . Cancer Father   . Heart failure Sister   . Tremor Maternal Grandmother   . Tremor Maternal Grandfather   . Tremor Paternal Grandmother   . Tremor Paternal Grandfather    History  Substance Use Topics  . Smoking status: Former Smoker    Quit date: 02/25/1970  . Smokeless tobacco: Never Used  . Alcohol Use: 0.0 oz/week     Comment: occ wine   OB History    Gravida Para Term Preterm AB TAB SAB Ectopic  Multiple Living   4 4   0     4     patient disoriented Review of Systems  Unable to perform ROS Musculoskeletal: Positive for gait problem.  Neurological: Positive for dizziness.      Allergies  Demerol; Fentanyl; Metoclopramide; Metoclopramide hcl; Morphine and related; Nsaids; Oxycodone-acetaminophen; Pentazocine; Pentazocine lactate; and Versed  Home Medications   Prior to Admission medications   Medication Sig Start Date End Date Taking? Authorizing Provider  albuterol (PROVENTIL HFA;VENTOLIN HFA) 108 (90 BASE) MCG/ACT inhaler Inhale 2 puffs into the lungs every 6 (six) hours as needed for wheezing or shortness of breath.   Yes Historical Provider, MD  amoxicillin (AMOXIL) 500 MG capsule Take 2,000 mg by mouth See admin instructions. 1 hour prior to dental appointment 03/10/14  Yes Historical Provider, MD   aspirin 81 MG tablet Take 81 mg by mouth daily.   Yes Historical Provider, MD  atenolol (TENORMIN) 25 MG tablet Take 25 mg by mouth daily.   Yes Historical Provider, MD  Carboxymethylcellul-Glycerin (REFRESH OPTIVE OP) Place 1 drop into both eyes at bedtime.    Yes Historical Provider, MD  Cholecalciferol (VITAMIN D) 2000 UNITS CAPS Take 1 capsule by mouth daily.   Yes Historical Provider, MD  colestipol (COLESTID) 1 G tablet Take 1 g by mouth 2 (two) times daily.   Yes Historical Provider, MD  furosemide (LASIX) 20 MG tablet Take 20 mg by mouth daily.   Yes Historical Provider, MD  hydroxychloroquine (PLAQUENIL) 200 MG tablet Take 200 mg by mouth 2 (two) times daily.    Yes Historical Provider, MD  Infant Care Products [facility] EX) Apply topically 3 (three) times daily.   Yes Historical Provider, MD  iron polysaccharides (NIFEREX) 150 MG capsule Take 150 mg by mouth 2 (two) times daily.   Yes Historical Provider, MD  LACTASE PO Take 2 tablets by mouth 3 (three) times daily with meals.    Yes Historical Provider, MD  levothyroxine (SYNTHROID, LEVOTHROID) 175 MCG tablet Take 175 mcg by mouth daily before breakfast.   Yes Historical Provider, MD  LORazepam (ATIVAN) 0.5 MG tablet Take 1 tablet (0.5 mg total) by mouth 2 (two) times daily. Hold for sedation 08/26/13  Yes Delfina Redwood, MD  potassium chloride (K-DUR) 10 MEQ tablet Take 10 mEq by mouth daily.   Yes Historical Provider, MD  predniSONE (DELTASONE) 5 MG tablet Take 5 mg by mouth daily with breakfast.  03/25/13  Yes Historical Provider, MD  primidone (MYSOLINE) 250 MG tablet Take 125 mg by mouth 2 (two) times daily. Take with 1 tablet of Primidone 50mg  06/19/13  Yes Kathrynn Ducking, MD  primidone (MYSOLINE) 50 MG tablet Take 1 tablet (50 mg total) by mouth at bedtime. Patient taking differently: Take 50 mg by mouth 2 (two) times daily. Take with 1/2 tablet Primidone 250 mg 07/31/13  Yes Kathrynn Ducking, MD  sertraline (ZOLOFT) 100 MG  tablet Take 100 mg by mouth daily.    Yes Historical Provider, MD  topiramate (TOPAMAX) 25 MG tablet Take 1 tablet (25 mg total) by mouth every morning. 11/27/13  Yes Kathrynn Ducking, MD  vitamin C (ASCORBIC ACID) 500 MG tablet Take 500 mg by mouth daily.   Yes Historical Provider, MD  Zinc Oxide (SECURA PROTECTIVE EX) Apply topically 3 (three) times daily.   Yes Historical Provider, MD  carboxymethylcellulose (REFRESH) 1 % ophthalmic solution Apply 1-2 drops to eye at bedtime.     Historical Provider, MD  diclofenac sodium (VOLTAREN) 1 % GEL Place 1 application onto the skin as needed.    Historical Provider, MD  Payton Mccallum  01/17/14   Historical Provider, MD  HYDROcodone-acetaminophen (NORCO/VICODIN) 5-325 MG per tablet Take 0.5-1 tablets by mouth 3 (three) times daily. Take 0.5 tablet at 8 am and 2 pm then take 1 tablet at 9 pm    Historical Provider, MD  levothyroxine (SYNTHROID, LEVOTHROID) 200 MCG tablet Take 400 mcg by mouth daily before breakfast.     Historical Provider, MD  MICRONIZED COLESTIPOL HCL 1 G tablet  03/16/14   Historical Provider, MD  senna-docusate (SENOKOT-S) 8.6-50 MG per tablet Take 1 tablet by mouth 2 (two) times daily. Patient not taking: Reported on 06/09/2014 08/26/13   Delfina Redwood, MD   BP 136/70 mmHg  Pulse 63  Temp(Src) 97.5 F (36.4 C) (Oral)  Resp 16  SpO2 98%  LMP 06/08/1986 Physical Exam  Constitutional:  Chronically ill-appearing, frail  HENT:  Head: Normocephalic and atraumatic.  Eyes: Conjunctivae are normal. Pupils are equal, round, and reactive to light.  Neck: Neck supple. No tracheal deviation present. No thyromegaly present.  Cardiovascular: Normal rate and regular rhythm.   No murmur heard. Pulmonary/Chest: Effort normal and breath sounds normal.  Abdominal: Soft. Bowel sounds are normal. She exhibits no distension. There is no tenderness.  Musculoskeletal: Normal range of motion. She exhibits no edema or tenderness.  Entire spine  nontender. Pelvis stable nontender  Neurological: She is alert. No cranial nerve deficit. Coordination normal.  Motor strength 5 over 5 overall oriented to name and hospital does not know month. Able to stand and has shuffling gait, unsteady with assistance. Follow simple commands moves all extremities  Skin: Skin is warm and dry. No rash noted.  Two centimeters skin tear at left lateral elbow no deformity or swelling or tenderness neurovascular intact.   Psychiatric: She has a normal mood and affect.  Nursing note and vitals reviewed.   ED Course  Procedures (including critical care time) Labs Review Labs Reviewed  CBG MONITORING, ED - Abnormal; Notable for the following:    Glucose-Capillary 101 (*)    All other components within normal limits  I-STAT CHEM 8, ED - Abnormal; Notable for the following:    Glucose, Bld 111 (*)    Calcium, Ion 1.11 (*)    All other components within normal limits  CBG MONITORING, ED    Imaging Review No results found.   EKG Interpretation   Date/Time:  Tuesday June 09 2014 02:53:04 EST Ventricular Rate:  59 PR Interval:  154 QRS Duration: 109 QT Interval:  579 QTC Calculation: 574 R Axis:   -50 Text Interpretation:  Sinus rhythm Probable left atrial enlargement Left  anterior fascicular block Nonspecific repol abnormality, lateral leads  Prolonged QT interval Confirmed by Winfred Leeds  MD, Jaclyne Haverstick [phone]) on 06/09/2014  4:56:19 AM     I spoke with patient's son. Patient's last tetanus shot 2 years ago  7:35 AM patient resting comfortably no distress, asymptomatic Results for orders placed or performed during the hospital encounter of 06/09/14  CBG monitoring, ED  Result Value Ref Range   Glucose-Capillary 101 (H) 70 - 99 mg/dL  I-Stat Chem 8, ED  Result Value Ref Range   Sodium 144 135 - 145 mmol/L   Potassium 3.6 3.5 - 5.1 mmol/L   Chloride 105 96 - 112 mmol/L   BUN 22 6 - 23 mg/dL   Creatinine, Ser 0.70 0.50 - 1.10 mg/dL  Glucose,  Bld 111 (H) 70 - 99 mg/dL   Calcium, Ion 1.11 (L) 1.13 - 1.30 mmol/L   TCO2 23 0 - 100 mmol/L   Hemoglobin 12.6 12.0 - 15.0 g/dL   HCT 37.0 36.0 - 46.0 %  I-stat chem 8, ed  Result Value Ref Range   Sodium 143 135 - 145 mmol/L   Potassium 3.7 3.5 - 5.1 mmol/L   Chloride 105 96 - 112 mmol/L   BUN 23 6 - 23 mg/dL   Creatinine, Ser 0.70 0.50 - 1.10 mg/dL   Glucose, Bld 114 (H) 70 - 99 mg/dL   Calcium, Ion 1.12 (L) 1.13 - 1.30 mmol/L   TCO2 23 0 - 100 mmol/L   Hemoglobin 12.9 12.0 - 15.0 g/dL   HCT 38.0 36.0 - 46.0 %   No results found.  MDM  Plan return to assisted-living Diagnosis #1 fall #2skin tear of left elbow #3 transient weakness Final diagnoses:  None        Orlie Dakin, MD 06/09/14 [phone]

## 2014-06-09 NOTE — ED Notes (Signed)
Pt dressed, provided ginger ale. Waiting for [facility] to transport patient back to [facility]. Attempt to contact pt's family members with no answer.

## 2014-06-09 NOTE — ED Notes (Signed)
PER EMS: pt from [facility], said she went to the bathroom, walked back to bed and felt dizzy and then she fell down, skin tear to left forearm. Pt denies LOC, head injury and denies any pain. BP-140/70, HR-60, RR-16.

## 2014-06-09 NOTE — Discharge Instructions (Signed)
Fall Prevention and Home Safety We suggest that [name] should be confined to a wheelchair unless closely supervised to prevent falls. Wash the wound on her left elbow daily with soap and water and then place a thin layer of bacitracin ointment over the wound and cover with a sterile bandage Falls cause injuries and can affect all age groups. It is possible to use preventive measures to significantly decrease the likelihood of falls. There are many simple measures which can make your home safer and prevent falls. OUTDOORS  Repair cracks and edges of walkways and driveways.  Remove high doorway thresholds.  Trim shrubbery on the main path into your home.  Have good outside lighting.  Clear walkways of tools, rocks, debris, and clutter.  Check that handrails are not broken and are securely fastened. Both sides of steps should have handrails.  Have leaves, snow, and ice cleared regularly.  Use sand or salt on walkways during winter months.  In the garage, clean up grease or oil spills. BATHROOM  Install night lights.  Install grab bars by the toilet and in the tub and shower.  Use non-skid mats or decals in the tub or shower.  Place a plastic non-slip stool in the shower to sit on, if needed.  Keep floors dry and clean up all water on the floor immediately.  Remove soap buildup in the tub or shower on a regular basis.  Secure bath mats with non-slip, double-sided rug tape.  Remove throw rugs and tripping hazards from the floors. BEDROOMS  Install night lights.  Make sure a bedside light is easy to reach.  Do not use oversized bedding.  Keep a telephone by your bedside.  Have a firm chair with side arms to use for getting dressed.  Remove throw rugs and tripping hazards from the floor. KITCHEN  Keep handles on pots and pans turned toward the center of the stove. Use back burners when possible.  Clean up spills quickly and allow time for drying.  Avoid walking  on wet floors.  Avoid hot utensils and knives.  Position shelves so they are not too high or low.  Place commonly used objects within easy reach.  If necessary, use a sturdy step stool with a grab bar when reaching.  Keep electrical cables out of the way.  Do not use floor polish or wax that makes floors slippery. If you must use wax, use non-skid floor wax.  Remove throw rugs and tripping hazards from the floor. STAIRWAYS  Never leave objects on stairs.  Place handrails on both sides of stairways and use them. Fix any loose handrails. Make sure handrails on both sides of the stairways are as long as the stairs.  Check carpeting to make sure it is firmly attached along stairs. Make repairs to worn or loose carpet promptly.  Avoid placing throw rugs at the top or bottom of stairways, or properly secure the rug with carpet tape to prevent slippage. Get rid of throw rugs, if possible.  Have an electrician put in a light switch at the top and bottom of the stairs. OTHER FALL PREVENTION TIPS  Wear low-heel or rubber-soled shoes that are supportive and fit well. Wear closed toe shoes.  When using a stepladder, make sure it is fully opened and both spreaders are firmly locked. Do not climb a closed stepladder.  Add color or contrast paint or tape to grab bars and handrails in your home. Place contrasting color strips on first and last steps.  Learn and use mobility aids as needed. Install an electrical emergency response system.  Turn on lights to avoid dark areas. Replace light bulbs that burn out immediately. Get light switches that glow.  Arrange furniture to create clear pathways. Keep furniture in the same place.  Firmly attach carpet with non-skid or double-sided tape.  Eliminate uneven floor surfaces.  Select a carpet pattern that does not visually hide the edge of steps.  Be aware of all pets. OTHER HOME SAFETY TIPS  Set the water temperature for 120 F (48.8  C).  Keep emergency numbers on or near the telephone.  Keep smoke detectors on every level of the home and near sleeping areas. Document Released: 04/14/2002 Document Revised: 10/24/2011 Document Reviewed: 07/14/2011 [facility] Patient Information 2015 [place], [place]. This information is not intended to replace advice given to you by your health care provider. Make sure you discuss any questions you have with your health care provider.

## 2014-07-18 ENCOUNTER — Other Ambulatory Visit: Payer: Self-pay

## 2014-07-18 MED ORDER — PRIMIDONE 50 MG PO TABS: 50.0000 mg | ORAL_TABLET | Freq: Two times a day (BID) | ORAL | Status: DC

## 2014-07-23 ENCOUNTER — Telehealth: Payer: Self-pay | Admitting: Neurology

## 2014-07-23 MED ORDER — PRIMIDONE 250 MG PO TABS: 125.0000 mg | ORAL_TABLET | Freq: Two times a day (BID) | ORAL | Status: DC

## 2014-07-23 NOTE — Telephone Encounter (Signed)
Rx has been sent.  Previously sent Mysoline on 03/12.

## 2014-07-23 NOTE — Telephone Encounter (Signed)
Collin with [facility] in [place] is calling to get a refill on primidone (MYSOLINE) 250 MG tablet for patient. The pharmacy states had previously sent a request. Thank you.

## 2014-08-04 ENCOUNTER — Encounter: Payer: Self-pay | Admitting: Certified Nurse Midwife

## 2014-08-04 ENCOUNTER — Ambulatory Visit: Payer: Medicare Other | Admitting: Certified Nurse Midwife

## 2014-11-10 ENCOUNTER — Telehealth: Payer: Self-pay | Admitting: Internal Medicine

## 2014-11-10 NOTE — Telephone Encounter (Signed)
lvm for pt with d.t of JULY appt

## 2014-11-10 NOTE — Telephone Encounter (Signed)
pt son called to r/s appt time....pt son has new d.t

## 2014-12-02 ENCOUNTER — Ambulatory Visit: Payer: Medicare Other | Admitting: Internal Medicine

## 2014-12-14 ENCOUNTER — Encounter: Payer: Self-pay | Admitting: Internal Medicine

## 2014-12-14 ENCOUNTER — Ambulatory Visit (HOSPITAL_BASED_OUTPATIENT_CLINIC_OR_DEPARTMENT_OTHER): Payer: Medicare Other | Admitting: Internal Medicine

## 2014-12-14 ENCOUNTER — Ambulatory Visit (HOSPITAL_BASED_OUTPATIENT_CLINIC_OR_DEPARTMENT_OTHER): Payer: Medicare Other

## 2014-12-14 VITALS — BP 115/59 | HR 79 | Temp 98.5°F | Resp 18 | Ht 65.0 in | Wt 143.0 lb

## 2014-12-14 DIAGNOSIS — Z85038 Personal history of other malignant neoplasm of large intestine: Secondary | ICD-10-CM | POA: Diagnosis not present

## 2014-12-14 DIAGNOSIS — I890 Lymphedema, not elsewhere classified: Secondary | ICD-10-CM | POA: Diagnosis not present

## 2014-12-14 DIAGNOSIS — Z853 Personal history of malignant neoplasm of breast: Secondary | ICD-10-CM | POA: Diagnosis not present

## 2014-12-14 DIAGNOSIS — C189 Malignant neoplasm of colon, unspecified: Secondary | ICD-10-CM

## 2014-12-14 HISTORY — DX: Lymphedema, not elsewhere classified: I89.0

## 2014-12-14 LAB — COMPREHENSIVE METABOLIC PANEL (CC13)
ALT: 9 U/L (ref 0–55)
ANION GAP: 10 meq/L (ref 3–11)
AST: 18 U/L (ref 5–34)
Albumin: 3.8 g/dL (ref 3.5–5.0)
Alkaline Phosphatase: 85 U/L (ref 40–150)
BUN: 17 mg/dL (ref 7.0–26.0)
CALCIUM: 8.6 mg/dL (ref 8.4–10.4)
CHLORIDE: 109 meq/L (ref 98–109)
CO2: 28 mEq/L (ref 22–29)
CREATININE: 0.7 mg/dL (ref 0.6–1.1)
EGFR: 83 mL/min/{1.73_m2} — AB (ref >90)
Glucose: 118 mg/dl (ref 70–140)
POTASSIUM: 4.3 meq/L (ref 3.5–5.1)
Sodium: 146 mEq/L — ABNORMAL HIGH (ref 136–145)
TOTAL PROTEIN: 6.3 g/dL — AB (ref 6.4–8.3)
Total Bilirubin: 0.44 mg/dL (ref 0.20–1.20)

## 2014-12-14 LAB — CBC WITH DIFFERENTIAL/PLATELET
BASO%: 0.3 % (ref 0.0–2.0)
Basophils Absolute: 0 10*3/uL (ref 0.0–0.1)
EOS ABS: 0.1 10*3/uL (ref 0.0–0.5)
EOS%: 1.1 % (ref 0.0–7.0)
HEMATOCRIT: 39.3 % (ref 34.8–46.6)
HEMOGLOBIN: 13.1 g/dL (ref 11.6–15.9)
LYMPH%: 12.9 % — AB (ref 14.0–49.7)
MCH: 33.1 pg (ref 25.1–34.0)
MCHC: 33.3 g/dL (ref 31.5–36.0)
MCV: 99.4 fL (ref 79.5–101.0)
MONO#: 0.4 10*3/uL (ref 0.1–0.9)
MONO%: 6.8 % (ref 0.0–14.0)
NEUT%: 78.9 % — ABNORMAL HIGH (ref 38.4–76.8)
NEUTROS ABS: 4.4 10*3/uL (ref 1.5–6.5)
Platelets: 130 10*3/uL — ABNORMAL LOW (ref 145–400)
RBC: 3.95 10*6/uL (ref 3.70–5.45)
RDW: 13.3 % (ref 11.2–14.5)
WBC: 5.6 10*3/uL (ref 3.9–10.3)
lymph#: 0.7 10*3/uL — ABNORMAL LOW (ref 0.9–3.3)

## 2014-12-14 NOTE — Progress Notes (Signed)
[place] [phone]   [phone]  OFFICE PROGRESS NOTE  MAZZOCCHI, Reggy Eye, MD [id number] [place]. [place] [address]  DIAGNOSIS:  1) Malignant neoplasm of breast (female), unspecified site 1985, 2005 on femara 2.5 mg/day  2) Colon cancer T3N0. 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  INTERVAL HISTORY: [name] 81 y.o. female returns to the clinic today for followup visit accompanied by her son. The patient was last seen 2 years ago. She was supposed to come back for follow-up visit in one year but she missed her appointment.The patient is feeling fine today with no specific complaints except for generalized fatigue and weakness as well as the swelling of the upper extremity secondary to lymphedema. The patient denied having any significant chest pain but continues to have shortness breath with exertion, no cough or hemoptysis. She has no significant weight loss or night sweats. She is here for routine evaluation.  MEDICAL HISTORY: Past Medical History  Diagnosis Date  . PE (pulmonary embolism)   . Subarachnoid hemorrhage   . Spastic hemiplegia affecting nondominant side   . Intracerebral hemorrhage   . Hypertension   . Arthritis   . Stroke 3/12  . Gastrointestinal problem   . Depression   . Breast CA     mastectomy  . Colon cancer   . Tremor     benign essential  . Rheumatoid arthritis(714.0)   . Hypothyroidism   . Dysrhythmia     palpitation  . Lymphedema of arm     right  . Atrophic vaginitis   . Osteoporosis   . Multinodular goiter   . Menieres disease 2002  . Thrombocytopenia 2002  . Gastroparesis 7/03  . Colon cancer 2007  . Pulmonary embolism 2003    chronic coumadin  . CVA (cerebral vascular accident)     ALLERGIES:  is allergic to demerol; fentanyl; metoclopramide;  metoclopramide hcl; morphine and related; nsaids; oxycodone-acetaminophen; pentazocine; pentazocine lactate; and versed.  MEDICATIONS:  Current Outpatient Prescriptions  Medication Sig Dispense Refill  . albuterol (PROVENTIL HFA;VENTOLIN HFA) 108 (90 BASE) MCG/ACT inhaler Inhale 2 puffs into the lungs every 6 (six) hours as needed for wheezing or shortness of breath.    Marland Kitchen amoxicillin (AMOXIL) 500 MG capsule Take 2,000 mg by mouth See admin instructions. 1 hour prior to dental appointment    . aspirin 81 MG tablet Take 81 mg by mouth daily.    Marland Kitchen atenolol (TENORMIN) 25 MG tablet Take 25 mg by mouth daily.    . Carboxymethylcellul-Glycerin (REFRESH OPTIVE OP) Place 1 drop into both eyes at bedtime.     . carboxymethylcellulose (REFRESH) 1 % ophthalmic solution Apply 1-2 drops to eye at bedtime.     . Cholecalciferol (VITAMIN D) 2000 UNITS CAPS Take 1 capsule by mouth daily.    . colestipol (COLESTID) 1 G tablet Take 1 g by mouth 2 (two) times daily.    . diclofenac sodium (VOLTAREN) 1 % GEL Place 1 application onto the skin as needed.    Marland Kitchen FLUVIRIN SUSP   0  . furosemide (LASIX) 20 MG tablet Take 20 mg by mouth daily.    Marland Kitchen HYDROcodone-acetaminophen (NORCO/VICODIN) 5-325 MG per tablet Take 0.5-1 tablets by mouth 3 (three) times daily. Take 0.5 tablet at 8 am and 2 pm then take 1 tablet  at 9 pm    . hydroxychloroquine (PLAQUENIL) 200 MG tablet Take 200 mg by mouth 2 (two) times daily.     . Infant Care Products [facility] EX) Apply topically 3 (three) times daily.    . iron polysaccharides (NIFEREX) 150 MG capsule Take 150 mg by mouth 2 (two) times daily.    Marland Kitchen LACTASE PO Take 2 tablets by mouth 3 (three) times daily with meals.     Marland Kitchen levothyroxine (SYNTHROID, LEVOTHROID) 175 MCG tablet Take 175 mcg by mouth daily before breakfast.    . levothyroxine (SYNTHROID, LEVOTHROID) 200 MCG tablet Take 400 mcg by mouth daily before breakfast.     . LORazepam (ATIVAN) 0.5 MG tablet Take 1 tablet (0.5 mg  total) by mouth 2 (two) times daily. Hold for sedation 30 tablet 0  . MICRONIZED COLESTIPOL HCL 1 G tablet     . potassium chloride (K-DUR) 10 MEQ tablet Take 10 mEq by mouth daily.    . predniSONE (DELTASONE) 5 MG tablet Take 5 mg by mouth daily with breakfast.     . primidone (MYSOLINE) 250 MG tablet Take 0.5 tablets (125 mg total) by mouth 2 (two) times daily. Take with 1 tablet of Primidone 50mg  30 tablet 6  . primidone (MYSOLINE) 50 MG tablet Take 1 tablet (50 mg total) by mouth 2 (two) times daily. Take with 1/2 tablet Primidone 250 mg twice daily 60 tablet 6  . senna-docusate (SENOKOT-S) 8.6-50 MG per tablet Take 1 tablet by mouth 2 (two) times daily. (Patient not taking: Reported on 06/09/2014)    . sertraline (ZOLOFT) 100 MG tablet Take 100 mg by mouth daily.     Marland Kitchen topiramate (TOPAMAX) 25 MG tablet Take 1 tablet (25 mg total) by mouth every morning. 30 tablet 3  . vitamin C (ASCORBIC ACID) 500 MG tablet Take 500 mg by mouth daily.    . Zinc Oxide (SECURA PROTECTIVE EX) Apply topically 3 (three) times daily.     No current facility-administered medications for this visit.    SURGICAL HISTORY:  Past Surgical History  Procedure Laterality Date  . Fracture surgery  2010    rt wrist  . Mastectomy      bilat mastectomies  . Breast surgery    . Joint replacement      Bilateral total hip replacement  . Hardware removal  03/29/2012    Procedure: HARDWARE REMOVAL;  Surgeon: Alta Corning, MD;  Location: [facility];  Service: Orthopedics;  Laterality: Left;  Screw Removal Left Foot  . Shoulder arthroscopy Right   . Thyroidectomy    . Total hip arthroplasty    . Cholecystectomy    . Tonsillectomy    . Left ankle      ruptured achilles tendon  . Carpal tunnel release Bilateral   . Thoracic outlet surgery Bilateral   . Ulnar nerve transposition Right   . Knee surgery Bilateral   . Lumbar laminectomy    . Nasal sinus surgery    . Cervical laminectomy    . Bunionectomy  Right   . Right wrist    . Squamous cell carcinoma resec      REVIEW OF SYSTEMS:  A comprehensive review of systems was negative except for: Constitutional: positive for fatigue Hematologic/lymphatic: positive for Bilateral upper extremity lymphedema Musculoskeletal: positive for muscle weakness   PHYSICAL EXAMINATION: General appearance: alert, cooperative, fatigued and no distress Head: Normocephalic, without obvious abnormality, atraumatic Neck: no adenopathy Lymph nodes: Cervical, supraclavicular, and axillary nodes  normal. Resp: clear to auscultation bilaterally Back: symmetric, no curvature. ROM normal. No CVA tenderness. Cardio: regular rate and rhythm, S1, S2 normal, no murmur, click, rub or gallop GI: soft, non-tender; bowel sounds normal; no masses,  no organomegaly Extremities: edema 2+ edema in the right upper extremity and 1+ edema in the left upper extremity secondary to lymphedema Neurologic: Alert and oriented X 3, normal strength and tone. Normal symmetric reflexes. Normal coordination and gait  ECOG PERFORMANCE STATUS: 2 - Symptomatic, <50% confined to bed  Last menstrual period 06/08/1986.  LABORATORY DATA: Lab Results  Component Value Date   WBC 6.3 08/25/2013   HGB 12.9 06/09/2014   HCT 38.0 06/09/2014   MCV 97.3 08/25/2013   PLT 168 08/25/2013      Chemistry      Component Value Date/Time   NA 143 06/09/2014 0456   NA 141 10/29/2012 1414   NA 139 10/15/2012 1308   K 3.7 06/09/2014 0456   K 3.7 10/29/2012 1414   CL 105 06/09/2014 0456   CL 103 10/29/2012 1414   CO2 26 08/25/2013 1047   CO2 29 10/29/2012 1414   BUN 23 06/09/2014 0456   BUN 10.4 10/29/2012 1414   BUN 11 10/15/2012 1308   CREATININE 0.70 06/09/2014 0456   CREATININE 0.8 10/29/2012 1414      Component Value Date/Time   CALCIUM 8.6 08/25/2013 1047   CALCIUM 8.9 10/29/2012 1414   ALKPHOS 70 10/29/2012 1414   ALKPHOS 63 10/15/2012 1308   AST 21 10/29/2012 1414   AST 22  10/15/2012 1308   ALT 12 10/29/2012 1414   ALT 17 10/15/2012 1308   BILITOT 0.38 10/29/2012 1414   BILITOT 0.3 10/15/2012 1308       RADIOGRAPHIC STUDIES: No results found.  ASSESSMENT AND PLAN:  This is a very pleasant 81 years old white female with history of recurrent breast carcinoma initially diagnosed in 1985 with recurrence in 2005 treated with Femara. She also has a history of colon cancer status post resection followed by 3 cycles of chemotherapy discontinued secondary to intolerance. The patient has been observation for the last few years with no significant evidence for disease recurrence. I recommended for the patient to have repeat bloodwork performed today for monitoring of her disease. I also recommended for the patient to have ultrasound Doppler of the upper extremity to rule out deep venous thrombosis. For the upper extremity lymphedema, I referred the patient to physical therapy for evaluation and management of her lymphedema. She will come back for follow-up visit in one year for reevaluation with repeat blood work. She was advised to call immediately if she has any concerning symptoms in the interval. All questions were answered. The patient knows to call the clinic with any problems, questions or concerns. We can certainly see the patient much sooner if necessary.  I spent 15 minutes counseling the patient face to face. The total time spent in the appointment was 25 minutes.  Disclaimer: This note was dictated with voice recognition software. Similar sounding words can inadvertently be transcribed and may be missed upon review.

## 2014-12-17 ENCOUNTER — Ambulatory Visit (HOSPITAL_COMMUNITY)
Admission: RE | Admit: 2014-12-17 | Discharge: 2014-12-17 | Disposition: A | Discharge status: Home / Self Care | Payer: Medicare Other | Source: Ambulatory Visit | Attending: Internal Medicine | Admitting: Internal Medicine

## 2014-12-17 DIAGNOSIS — I890 Lymphedema, not elsewhere classified: Secondary | ICD-10-CM | POA: Diagnosis not present

## 2014-12-17 DIAGNOSIS — C189 Malignant neoplasm of colon, unspecified: Secondary | ICD-10-CM

## 2014-12-17 DIAGNOSIS — Z853 Personal history of malignant neoplasm of breast: Secondary | ICD-10-CM | POA: Diagnosis not present

## 2014-12-17 DIAGNOSIS — M7989 Other specified soft tissue disorders: Secondary | ICD-10-CM | POA: Diagnosis present

## 2014-12-17 NOTE — Progress Notes (Signed)
*  Preliminary Results* Right upper extremity venous duplex completed. Right upper extremity is negative for deep and superficial vein thrombosis.   Preliminary results discussed with Stanton Kidney, RN.  Patient declined evaluation of the left upper extremity, stated that only the right was bothering her.  12/17/2014 1:48 PM  Maudry Mayhew, RVT, RDCS, RDMS

## 2014-12-30 ENCOUNTER — Ambulatory Visit: Payer: Medicare Other | Attending: Internal Medicine | Admitting: Physical Therapy

## 2014-12-30 DIAGNOSIS — E8989 Other postprocedural endocrine and metabolic complications and disorders: Secondary | ICD-10-CM | POA: Insufficient documentation

## 2014-12-30 DIAGNOSIS — I890 Lymphedema, not elsewhere classified: Secondary | ICD-10-CM | POA: Diagnosis present

## 2014-12-30 DIAGNOSIS — M7989 Other specified soft tissue disorders: Secondary | ICD-10-CM | POA: Insufficient documentation

## 2014-12-30 NOTE — Therapy (Signed)
[facility] [address] [place], [place], [address] Phone: [phone]   Fax:  [phone]  Physical Therapy Evaluation  Patient Details  Name: [name] MRN: [id number] Date of Birth: 1933-07-14 Referring Provider:  Si Gaul, MD  Encounter Date: 12/30/2014      PT End of Session - 12/30/14 2251    Visit Number 1   Number of Visits 1   PT Start Time 1555   PT Stop Time 1705   PT Time Calculation (min) 70 min   Activity Tolerance Patient tolerated treatment well   Behavior During Therapy [facility] for tasks assessed/performed      Past Medical History  Diagnosis Date  . PE (pulmonary embolism)   . Subarachnoid hemorrhage   . Spastic hemiplegia affecting nondominant side   . Intracerebral hemorrhage   . Hypertension   . Arthritis   . Stroke 3/12  . Gastrointestinal problem   . Depression   . Breast CA     mastectomy  . Colon cancer   . Tremor     benign essential  . Rheumatoid arthritis(714.0)   . Hypothyroidism   . Dysrhythmia     palpitation  . Lymphedema of arm     right  . Atrophic vaginitis   . Osteoporosis   . Multinodular goiter   . Menieres disease 2002  . Thrombocytopenia 2002  . Gastroparesis 7/03  . Colon cancer 2007  . Pulmonary embolism 2003    chronic coumadin  . CVA (cerebral vascular accident)   . Lymphedema of upper extremity 12/14/2014    Past Surgical History  Procedure Laterality Date  . Fracture surgery  2010    rt wrist  . Mastectomy      bilat mastectomies  . Breast surgery    . Joint replacement      Bilateral total hip replacement  . Hardware removal  03/29/2012    Procedure: HARDWARE REMOVAL;  Surgeon: Harvie Junior, MD;  Location: [facility];  Service: Orthopedics;  Laterality: Left;  Screw Removal Left Foot  . Shoulder arthroscopy Right   . Thyroidectomy    . Total hip arthroplasty    . Cholecystectomy    . Tonsillectomy    . Left ankle       ruptured achilles tendon  . Carpal tunnel release Bilateral   . Thoracic outlet surgery Bilateral   . Ulnar nerve transposition Right   . Knee surgery Bilateral   . Lumbar laminectomy    . Nasal sinus surgery    . Cervical laminectomy    . Bunionectomy Right   . Right wrist    . Squamous cell carcinoma resec      There were no vitals filed for this visit.  Visit Diagnosis:  Lymphedema of upper extremity following lymphadenectomy - Plan: PT plan of care cert/re-cert  Swelling of both lower extremities - Plan: PT plan of care cert/re-cert      Subjective Assessment - 12/30/14 2226    Subjective Right arm is swelling more with hot weather.  Also having some left hand swelling and bilateral leg swelling.   Patient is accompained by: Family member  son Onalee Hua   Pertinent History Breast cancer x2, both breasts,in approx. 1984 and 1989 with bilateral mastectomy and ALND.  Colon cancer in approx. 2004.  Bilateral TKAs and THAs (the latter replaced as well); elbow surgeries; carpal tunnel surgeries; neck surgery (not fusion); h/o strokes approx. 4 years ago with some left  hand weakness and some aphasia; h/o lymphedema in right arm with more recent left hand swelling and leg swelling.   Patient Stated Goals to manage swelling            [facility] PT Assessment - 12/30/14 0001    Assessment   Medical Diagnosis lymphedema right UE   Onset Date/Surgical Date --  chronic   Precautions   Precautions Fall;Other (comment)   Precaution Comments partial expressive aphasia; cancer precautions   Restrictions   Weight Bearing Restrictions No   Balance Screen   Has the patient fallen in the past 6 months Yes   How many times? 1  but also fell 1/15 with multiple fractures, hosp., rehab   Has the patient had a decrease in activity level because of a fear of falling?  Yes   Is the patient reluctant to leave their home because of a fear of falling?  Yes   Home Environment   Living Environment  Skilled nursing facility   Additional Comments husband lives there too   Prior Function   Level of Independence Needs assistance with ADLs;Needs assistance with homemaking;Needs assistance with gait   Cognition   Overall Cognitive Status Difficult to assess   Difficult to assess due to Impaired communication   Observation/Other Assessments   Observations elderly woman looking well taken care of, sitting in wheelchair, accompanied by caring son   Skin Integrity generally intact   Posture/Postural Control   Posture/Postural Control Postural limitations   Postural Limitations Rounded Shoulders;Forward head   ROM / Strength   AROM / PROM / Strength AROM   AROM   Overall AROM Comments Right shoulder moderately limited, left > 50% impaired; LEs grossly functional with decreased left knee flexion and left ankle eversion; knee extension may be slightly limited as well (gross assessment performed in sitting)   Transfers   Transfers Sit to Stand   Sit to Stand 3: Mod assist   Ambulation/Gait   Ambulation/Gait Yes   Ambulation/Gait Assistance 4: Min guard   Assistive device Rolling walker           LYMPHEDEMA/ONCOLOGY QUESTIONNAIRE - 12/30/14 2240    Type   Cancer Type breast (bilat.) and colon   Surgeries   Mastectomy Date --  1984, 1989   Number Lymph Nodes Removed --  ALND bilat.   What other symptoms do you have   Are you having pitting edema Yes   Body Site right UE, both LEs   Stemmer Sign No   Lymphedema Stage   Stage STAGE 2 SPONTANEOUSLY IRREVERSIBLE  in right UE; possible stage I in left UE   Lymphedema Assessments   Lymphedema Assessments Upper extremities;Lower extremities   Right Upper Extremity Lymphedema   10 cm Proximal to Olecranon Process 29.7 cm   Olecranon Process 27.7 cm   10 cm Proximal to Ulnar Styloid Process 23 cm   Just Proximal to Ulnar Styloid Process 18.3 cm   Across Hand at [employer] 20.8 cm   At [place] of 2nd Digit 6.6 cm   Left Upper  Extremity Lymphedema   10 cm Proximal to Olecranon Process 24.9 cm   Olecranon Process 24.8 cm   10 cm Proximal to Ulnar Styloid Process 20.7 cm   Just Proximal to Ulnar Styloid Process 18 cm   Across Hand at [employer] 20.5 cm   At [place] of 2nd Digit 6.4 cm   Right Lower Extremity Lymphedema   30 cm Proximal to Floor at  Lateral Plantar Foot 33.2 cm   20 cm Proximal to Floor at Lateral Plantar Foot 25.$RemoveBeforeD'1 1   10 'FyshmotjwvUYRQ$ cm Proximal to Floor at Lateral Malleoli 25.4 cm   5 cm Proximal to 1st MTP Joint 24 cm   Across MTP Joint 23.2 cm   Around Proximal Great Toe 8.3 cm   Left Lower Extremity Lymphedema   30 cm Proximal to Floor at Lateral Plantar Foot 34 cm   20 cm Proximal to Floor at Lateral Plantar Foot 27 cm   10 cm Proximal to Floor at Lateral Malleoli 25.5 cm   5 cm Proximal to 1st MTP Joint 24.4 cm   Across MTP Joint 22.8 cm   Around Proximal Great Toe 8.7 cm                [facility] Adult PT Treatment/Exercise - 05-Jan-2015 0001    Self-Care   Self-Care Other Self-Care Comments   Other Self-Care Comments  Affirmed choice of 20-30 mmHg compression sleeve for right UE and that a better-fitting one is needed; suggested knee-high compression stockings for both legs; suggested 20-30 mmHg compression gauntlet or glove for left hand;                PT Education - 01/05/15 2249    Education provided Yes   Education Details recommended compression garments as described under self-care section, with these to be worn in daytime only; availability of nighttime compression garments; availability of lymphedema pump; gave [facility] Lymphedema info pamphlet   Person(s) Educated Patient;Child(ren)   Methods Explanation;Handout   Comprehension Verbalized understanding                [facility] Goals - 01-05-2015 2256-07-20    CC Long Term Goal  #1   Title Patient and her son will be knowledgeable about recommendations for lymphedema management at home   Time 1    Period Days   Status Achieved            Plan - 2015/01/05 July 21, 2250    Clinical Impression Statement Elderly woman with complicated medical history and impaired mobility who has a history of right UE lymphedema and newer left hand and both leg swelling.  Patient's son explains they are here for recommendations on care for swelling; neither patient nor son is interested in a course of therapy.  Suggestions were give today as outlined elsewhere in this note and both seemed satisfied with that.     Pt will benefit from skilled therapeutic intervention in order to improve on the following deficits Increased edema   Rehab Potential Good   Clinical Impairments Affecting Rehab Potential Limited mobility and use of left UE with complicated medical history including several orthopedic surgeries and one or more strokes.   PT Frequency One time visit   PT Treatment/Interventions Patient/family education;DME Instruction   PT Next Visit Plan none; discharge today   PT Home Exercise Plan Patient is undergoing physical therapy at her place of residence.   Consulted and Agree with Plan of Care Patient;Family member/caregiver          G-Codes - 01-05-2015 07/20/2257    Functional Assessment Tool Used clinical judgement   Functional Limitation Self care   Self Care Current Status [phone]) At least 20 percent but less than 40 percent impaired, limited or restricted   Self Care Goal Status (Q1975) At least 20 percent but less than 40 percent impaired, limited or restricted   Self Care Discharge Status (  G8989) At least 20 percent but less than 40 percent impaired, limited or restricted       Problem List Patient Active Problem List   Diagnosis Date Noted  . Lymphedema of upper extremity 12/14/2014  . Postsurgical hypothyroidism 01/29/2014  . Depression 10/05/2013  . Anxiety 10/05/2013  . Bilateral sacral insufficiency fracture 08/25/2013  . Acute pain 08/23/2013  . Essential tremor 08/18/2013  . Essential  hypertension, benign 08/18/2013  . Unspecified hypothyroidism 08/18/2013  . Rheumatoid arthritis 08/18/2013  . Pelvic fracture 08/17/2013  . Fall 08/17/2013  . Personal history of malignant neoplasm of breast 11/05/2012  . Colon cancer 11/05/2012  . Abnormality of gait 05/13/2012  . Intracerebral hemorrhage 05/13/2012  . Encounter for therapeutic drug monitoring 05/13/2012  . Essential and other specified forms of tremor 05/13/2012    Zafira Munos 12/30/2014, 11:03 PM  [facility] [place], [place], [address] Phone: [phone]   Fax:  [phone]  PHYSICAL THERAPY DISCHARGE SUMMARY  Visits from Start of Care: 1  Current functional level related to goals / functional outcomes: Goal met as noted above. Patient and her son agreed that they did not want follow-up treatment, but an assessment of needs and suggestions for management of lymphedema at home today; they were satisfied with information given.   Remaining deficits: Swelling will continue to require management. Patient has mobility deficits that are being addressed in physical therapy at her residence.   Education / Equipment: About treatment and equipment options; recommendations given for daytime compression garments. Plan: Patient agrees to discharge.  Patient goals were met. Patient is being discharged due to being pleased with the current functional level.  ?????       Serafina Royals, PT 12/30/2014 11:06 PM

## 2015-03-16 ENCOUNTER — Telehealth: Payer: Self-pay | Admitting: Neurology

## 2015-03-16 NOTE — Telephone Encounter (Signed)
Joan Claude, NP is calling and states the patient is having a lot of delusions and paranoia which has increased in the past 6 weeks.  She would like to speak with you about the tests she has made and any suggestions that you might have.  Please call [phone].

## 2015-03-16 NOTE — Telephone Encounter (Signed)
The patient was last seen through our office in January 2016, she has had a significant worsening of cognitive functioning, she is wheelchair-bound at this time. The patient is on Topamax and Mysoline for her essential tremor. We may discontinue the Topamax now, I will get a revisit to see her in the next several weeks. The patient continually thinks that the house is burning down.

## 2015-03-16 NOTE — Telephone Encounter (Signed)
Returned call. No answer. Will try later. 

## 2015-03-17 NOTE — Telephone Encounter (Signed)
Spoke to son. Scheduled appt per transportation availability for pt.

## 2015-03-25 ENCOUNTER — Ambulatory Visit (INDEPENDENT_AMBULATORY_CARE_PROVIDER_SITE_OTHER): Payer: Medicare Other | Admitting: Neurology

## 2015-03-25 ENCOUNTER — Encounter: Payer: Self-pay | Admitting: Neurology

## 2015-03-25 VITALS — BP 131/75 | HR 83 | Ht 66.0 in | Wt 147.0 lb

## 2015-03-25 DIAGNOSIS — G250 Essential tremor: Secondary | ICD-10-CM | POA: Diagnosis not present

## 2015-03-25 DIAGNOSIS — R269 Unspecified abnormalities of gait and mobility: Secondary | ICD-10-CM

## 2015-03-25 NOTE — Progress Notes (Signed)
Reason for visit:  tremor  [name] is an 81 y.o. female  History of present illness:   [name] is a 81 year old right-handed white female with a history of an essential tremor, and a severe gait disorder. The patient is essentially nonambulatory at this point. She can walk with assistance for short distances, she mainly is in a wheelchair. The patient has been residing in [facility] with her husband. The patient does have some memory issues, and within the last 6-8 weeks she has developed increasing problems with delusional thinking. The patient will get a concept in her head such as the buildings burning down and cannot let go of it. The patient gets somewhat agitated with this. She is on Zoloft 100 mg daily. The patient is sleeping fairly well at night, however. The patient has not had any recent falls. She is on Mysoline for the tremor, and the Topamax was recently discontinued. The patient comes into the office today for an evaluation of these delusional thought processes.  Past Medical History  Diagnosis Date  . PE (pulmonary embolism)   . Subarachnoid hemorrhage ([place])   . Spastic hemiplegia affecting nondominant side ([place])   . Intracerebral hemorrhage ([place])   . Hypertension   . Arthritis   . Stroke ([place]) 3/12  . Gastrointestinal problem   . Depression   . Breast CA ([place])     mastectomy  . Colon cancer ([place])   . Tremor     benign essential  . Rheumatoid arthritis(714.0)   . Hypothyroidism   . Dysrhythmia     palpitation  . Lymphedema of arm     right  . Atrophic vaginitis   . Osteoporosis   . Multinodular goiter   . [place]ie[place] disease 2002  . Thrombocytopenia ([place]) 2002  . Gastroparesis 7/03  . Colon cancer ([place]) 2007  . Pulmonary embolism ([place]) 2003    chronic coumadin  . CVA (cerebral vascular accident) ([place])   . Lymphedema of upper extremity 12/14/2014    Past Surgical History  Procedure Laterality Date  . Fracture surgery  2010    rt  wrist  . Mastectomy      bilat mastectomies  . Breast surgery    . Joint replacement      Bilateral total hip replacement  . Hardware removal  03/29/2012    Procedure: HARDWARE REMOVAL;  Surgeon: Alta Corning, MD;  Location: [facility];  Service: Orthopedics;  Laterality: Left;  Screw Removal Left Foot  . Shoulder arthroscopy Right   . Thyroidectomy    . Total hip arthroplasty    . Cholecystectomy    . Tonsillectomy    . Left ankle      ruptured achilles tendon  . Carpal tunnel release Bilateral   . Thoracic outlet surgery Bilateral   . Ulnar nerve transposition Right   . Knee surgery Bilateral   . Lumbar laminectomy    . Nasal sinus surgery    . Cervical laminectomy    . Bunionectomy Right   . Right wrist    . Squamous cell carcinoma resec      Family History  Problem Relation Age of Onset  . Cancer Father   . Heart failure Sister   . Tremor Maternal Grandmother   . Tremor Maternal Grandfather   . Tremor Paternal Grandmother   . Tremor Paternal Grandfather     Social history:  reports that she quit smoking about 45 years ago. She  has never used smokeless tobacco. She reports that she does not drink alcohol or use illicit drugs.    Allergies  Allergen Reactions  . Demerol   . Fentanyl   . Metoclopramide Other (See Comments)    crazy  . Metoclopramide Hcl   . Morphine And Related   . Nsaids     Upset stomach  . Oxycodone-Acetaminophen     Other reaction(s): Hallucinations  . Pentazocine     Other reaction(s): Hallucinations  . Pentazocine Lactate   . Versed [Midazolam] Other (See Comments)    Medications:  Prior to Admission medications   Medication Sig Start Date End Date Taking? Authorizing Provider  acetaminophen (TYLENOL) 500 MG tablet Take 500 mg by mouth every 6 (six) hours as needed.   Yes Historical Provider, MD  albuterol (PROVENTIL HFA;VENTOLIN HFA) 108 (90 BASE) MCG/ACT inhaler Inhale 2 puffs into the lungs every 6 (six) hours as  needed for wheezing or shortness of breath.   Yes Historical Provider, MD  amoxicillin (AMOXIL) 500 MG capsule Take 2,000 mg by mouth See admin instructions. 1 hour prior to dental appointment 03/10/14  Yes Historical Provider, MD  aspirin 81 MG tablet Take 81 mg by mouth daily.   Yes Historical Provider, MD  carboxymethylcellulose (REFRESH) 1 % ophthalmic solution Apply 1-2 drops to eye at bedtime.    Yes Historical Provider, MD  Cholecalciferol (VITAMIN D) 2000 UNITS CAPS Take 1 capsule by mouth daily.   Yes Historical Provider, MD  colestipol (COLESTID) 1 G tablet Take 1 g by mouth 2 (two) times daily.   Yes Historical Provider, MD  CRANBERRY PO Take 425 mg by mouth daily.   Yes Historical Provider, MD  Darcey Nora SUSP  01/17/14  Yes Historical Provider, MD  furosemide (LASIX) 20 MG tablet Take 20 mg by mouth daily.   Yes Historical Provider, MD  hydroxychloroquine (PLAQUENIL) 200 MG tablet Take 200 mg by mouth 2 (two) times daily.    Yes Historical Provider, MD  Infant Care Products [facility] EX) Apply topically 3 (three) times daily.   Yes Historical Provider, MD  LACTASE PO Take 2 tablets by mouth 3 (three) times daily with meals.    Yes Historical Provider, MD  levothyroxine (SYNTHROID, LEVOTHROID) 200 MCG tablet Take 400 mcg by mouth daily before breakfast.    Yes Historical Provider, MD  LORazepam (ATIVAN) 0.5 MG tablet Take 1 tablet (0.5 mg total) by mouth 2 (two) times daily. Hold for sedation 08/26/13  Yes Delfina Redwood, MD  MINERAL OIL PO Take 1 capsule by mouth daily.   Yes Historical Provider, MD  nystatin cream (MYCOSTATIN) Apply 1 application topically 2 (two) times daily.   Yes Historical Provider, MD  predniSONE (DELTASONE) 5 MG tablet Take 5 mg by mouth daily with breakfast.  03/25/13  Yes Historical Provider, MD  primidone (MYSOLINE) 250 MG tablet Take 0.5 tablets (125 mg total) by mouth 2 (two) times daily. Take with 1 tablet of Primidone 50mg  07/23/14  Yes Kathrynn Ducking,  MD  primidone (MYSOLINE) 50 MG tablet Take 1 tablet (50 mg total) by mouth 2 (two) times daily. Take with 1/2 tablet Primidone 250 mg twice daily 07/18/14  Yes Kathrynn Ducking, MD  sertraline (ZOLOFT) 100 MG tablet Take 150 mg by mouth daily.    Yes Historical Provider, MD    ROS:  Out of a complete 14 system review of symptoms, the patient complains only of the following symptoms, and all other reviewed systems are negative.  Hearing loss  Leg swelling  Frequent waking  Joint pain, achy muscles, walking difficulty, neck pain, neck stiffness  Memory loss, weakness  Agitation, confusion, anxiety, hallucinations  Blood pressure 131/75, pulse 83, height 5\' 6"  (1.676 m), weight 147 lb (66.679 kg), last menstrual period 06/08/1986.  Physical Exam  General: The patient is alert and cooperative at the time of the examination.  Skin: No significant peripheral edema is noted.   Neurologic Exam  Mental status: The patient is alert and oriented x 2 at the time of the examination (not oriented to date). The MMSE reveals a score of 19/30. AFT score is 7 in 30 seconds.   Cranial nerves: Facial symmetry is present. Speech is normal, no aphasia or dysarthria is noted. Extraocular movements are full. Visual fields are full.  Motor: The patient has good strength in all 4 extremities.  Sensory examination: Soft touch sensation is symmetric on the face, arms, and legs.  Coordination: The patient has good finger-nose-finger and heel-to-shin bilaterally. The patient has some tremors with the left greater than right upper extremity.  Gait and station: The patient is unable to walk, even with assistance. The patient tends to lean backwards, requires maximal assistance with trying to stand. She is wheelchair-bound.  Reflexes: Deep tendon reflexes are symmetric.   Assessment/Plan:   1. Essential tremor   2. Delusional thinking   3. Gait disorder   4. Memory disorder   The patient likely  is developing a dementing illness, the delusional thinking is part of this issue. The patient will go up on the Zoloft dose taking 150 mg a day. The delusional thinking may be very difficult to treat. Antipsychotic medication may need to be used in the future if this remains a problem, Nuplazid is thought to be effective for delusional thinking. The patient will follow-up through this office in 3 months.  Jill Alexanders MD 03/25/2015 4:38 PM  [facility] [address] [place],  [address]  Phone [phone] Fax [phone]

## 2015-03-25 NOTE — Patient Instructions (Addendum)
  We will go up on the zoloft to 150 mg a day.   Tremor A tremor is trembling or shaking that you cannot control. Most tremors affect the hands or arms. Tremors can also affect the head, vocal cords, face, and other parts of the body. There are many types of tremors. Common types include:   Essential tremor. These usually occur in people over the age of 98. It may run in families and can happen in otherwise healthy people.   Resting tremor. These occur when the muscles are at rest, such as when your hands are resting in your lap. People with Parkinson disease often have resting tremors.   Postural tremor. These occur when you try to hold a pose, such as keeping your hands outstretched.   Kinetic tremor. These occur during purposeful movement, such as trying to touch a finger to your nose.   Task-specific tremor. These may occur when you perform tasks such as handwriting, speaking, or standing.   Psychogenic tremor. These dramatically lessen or disappear when you are distracted. They can happen in people of all ages.  Some types of tremors have no known cause. Tremors can also be a symptom of nervous system problems (neurological disorders) that may occur with aging. Some tremors go away with treatment while others do not.  HOME CARE INSTRUCTIONS Watch your tremor for any changes. The following actions may help to lessen any discomfort you are feeling:  Take medicines only as directed by your health care provider.   Limit alcohol intake to no more than 1 drink per day for nonpregnant women and 2 drinks per day for men. One drink equals 12 oz of beer, 5 oz of wine, or 1 oz of hard liquor.  Do not use any tobacco products, including cigarettes, chewing tobacco, or electronic cigarettes. If you need help quitting, ask your health care provider.   Avoid extreme heat or cold.   Limit the amount of caffeine you consumeas directed by your health care provider.   Try to get 8 hours  of sleep each night.  Find ways to manage your stress, such as meditation or yoga.  Keep all follow-up visits as directed by your health care provider. This is important. SEEK MEDICAL CARE IF:  You start having a tremor after starting a new medicine.  You have tremor with other symptoms such as:  Numbness.  Tingling.  Pain.  Weakness.  Your tremor gets worse.  Your tremor interferes with your day-to-day life.   This information is not intended to replace advice given to you by your health care provider. Make sure you discuss any questions you have with your health care provider.   Document Released: 04/14/2002 Document Revised: 05/15/2014 Document Reviewed: 10/20/2013 Elsevier Interactive Patient Education [employer].

## 2015-04-08 ENCOUNTER — Emergency Department (HOSPITAL_COMMUNITY): Payer: Medicare Other

## 2015-04-08 ENCOUNTER — Encounter (HOSPITAL_COMMUNITY): Payer: Self-pay | Admitting: Emergency Medicine

## 2015-04-08 ENCOUNTER — Emergency Department (HOSPITAL_COMMUNITY)
Admission: EM | Admit: 2015-04-08 | Discharge: 2015-04-08 | Disposition: A | Discharge status: Home / Self Care | Payer: Medicare Other | Attending: Emergency Medicine | Admitting: Emergency Medicine

## 2015-04-08 DIAGNOSIS — Z85038 Personal history of other malignant neoplasm of large intestine: Secondary | ICD-10-CM | POA: Insufficient documentation

## 2015-04-08 DIAGNOSIS — Y92129 Unspecified place in nursing home as the place of occurrence of the external cause: Secondary | ICD-10-CM | POA: Insufficient documentation

## 2015-04-08 DIAGNOSIS — I10 Essential (primary) hypertension: Secondary | ICD-10-CM | POA: Diagnosis not present

## 2015-04-08 DIAGNOSIS — W06XXXA Fall from bed, initial encounter: Secondary | ICD-10-CM | POA: Insufficient documentation

## 2015-04-08 DIAGNOSIS — Z86711 Personal history of pulmonary embolism: Secondary | ICD-10-CM | POA: Diagnosis not present

## 2015-04-08 DIAGNOSIS — Z853 Personal history of malignant neoplasm of breast: Secondary | ICD-10-CM | POA: Insufficient documentation

## 2015-04-08 DIAGNOSIS — S0083XA Contusion of other part of head, initial encounter: Secondary | ICD-10-CM | POA: Diagnosis not present

## 2015-04-08 DIAGNOSIS — F329 Major depressive disorder, single episode, unspecified: Secondary | ICD-10-CM | POA: Diagnosis not present

## 2015-04-08 DIAGNOSIS — Y9389 Activity, other specified: Secondary | ICD-10-CM | POA: Diagnosis not present

## 2015-04-08 DIAGNOSIS — Y999 Unspecified external cause status: Secondary | ICD-10-CM | POA: Insufficient documentation

## 2015-04-08 DIAGNOSIS — Z87891 Personal history of nicotine dependence: Secondary | ICD-10-CM | POA: Insufficient documentation

## 2015-04-08 DIAGNOSIS — M069 Rheumatoid arthritis, unspecified: Secondary | ICD-10-CM | POA: Insufficient documentation

## 2015-04-08 DIAGNOSIS — E039 Hypothyroidism, unspecified: Secondary | ICD-10-CM | POA: Diagnosis not present

## 2015-04-08 DIAGNOSIS — Z792 Long term (current) use of antibiotics: Secondary | ICD-10-CM | POA: Insufficient documentation

## 2015-04-08 DIAGNOSIS — S0081XA Abrasion of other part of head, initial encounter: Secondary | ICD-10-CM | POA: Diagnosis not present

## 2015-04-08 DIAGNOSIS — Z8673 Personal history of transient ischemic attack (TIA), and cerebral infarction without residual deficits: Secondary | ICD-10-CM | POA: Diagnosis not present

## 2015-04-08 DIAGNOSIS — Z7982 Long term (current) use of aspirin: Secondary | ICD-10-CM | POA: Insufficient documentation

## 2015-04-08 DIAGNOSIS — Z7952 Long term (current) use of systemic steroids: Secondary | ICD-10-CM | POA: Diagnosis not present

## 2015-04-08 DIAGNOSIS — Z23 Encounter for immunization: Secondary | ICD-10-CM | POA: Diagnosis not present

## 2015-04-08 DIAGNOSIS — Z79899 Other long term (current) drug therapy: Secondary | ICD-10-CM | POA: Diagnosis not present

## 2015-04-08 DIAGNOSIS — M1990 Unspecified osteoarthritis, unspecified site: Secondary | ICD-10-CM | POA: Insufficient documentation

## 2015-04-08 DIAGNOSIS — S0990XA Unspecified injury of head, initial encounter: Secondary | ICD-10-CM | POA: Diagnosis present

## 2015-04-08 DIAGNOSIS — W19XXXA Unspecified fall, initial encounter: Secondary | ICD-10-CM

## 2015-04-08 LAB — CBG MONITORING, ED: Glucose-Capillary: 126 mg/dL — ABNORMAL HIGH (ref 65–99)

## 2015-04-08 MED ORDER — HYDROCODONE-ACETAMINOPHEN 5-325 MG PO TABS: 1.0000 | ORAL_TABLET | Freq: Four times a day (QID) | ORAL | Status: DC | PRN

## 2015-04-08 MED ORDER — ACETAMINOPHEN 325 MG PO TABS
650.0000 mg | ORAL_TABLET | Freq: Once | ORAL | Status: AC
  Administered 2015-04-08: 650 mg via ORAL
  Filled 2015-04-08: qty 2

## 2015-04-08 MED ORDER — TETANUS-DIPHTH-ACELL PERTUSSIS 5-2.5-18.5 LF-MCG/0.5 IM SUSP
0.5000 mL | Freq: Once | INTRAMUSCULAR | Status: AC
  Administered 2015-04-08: 0.5 mL via INTRAMUSCULAR
  Filled 2015-04-08: qty 0.5

## 2015-04-08 NOTE — ED Notes (Signed)
Pt brought to ED by [facility] from [facility] living after having a fall. Assisting living staff reported that pt fell from the bed and hit her head, pt state the nurse aid was assisting her out of bed and dropped her, she hit her left side of the head and has some swollen and small laceration on the left forehead. VSS per [facility].

## 2015-04-08 NOTE — Discharge Instructions (Signed)
You were seen today after a fall. Your CT scan is reassuring. He did sustain a large contusion to your face as well as an abrasion. Keep this moist with antibiotic ointment.   Facial or Scalp Contusion A facial or scalp contusion is a deep bruise on the face or head. Injuries to the face and head generally cause a lot of swelling, especially around the eyes. Contusions are the result of an injury that caused bleeding under the skin. The contusion may turn blue, purple, or yellow. Minor injuries will give you a painless contusion, but more severe contusions may stay painful and swollen for a few weeks.  CAUSES  A facial or scalp contusion is caused by a blunt injury or trauma to the face or head area.  SIGNS AND SYMPTOMS   Swelling of the injured area.   Discoloration of the injured area.   Tenderness, soreness, or pain in the injured area.  DIAGNOSIS  The diagnosis can be made by taking a medical history and doing a physical exam. An X-ray exam, CT scan, or MRI may be needed to determine if there are any associated injuries, such as broken bones (fractures). TREATMENT  Often, the best treatment for a facial or scalp contusion is applying cold compresses to the injured area. Over-the-counter medicines may also be recommended for pain control.  HOME CARE INSTRUCTIONS   Only take over-the-counter or prescription medicines as directed by your health care provider.   Apply ice to the injured area.   Put ice in a plastic bag.   Place a towel between your skin and the bag.   Leave the ice on for 20 minutes, 2-3 times a day.  SEEK MEDICAL CARE IF:  You have bite problems.   You have pain with chewing.   You are concerned about facial defects. SEEK IMMEDIATE MEDICAL CARE IF:  You have severe pain or a headache that is not relieved by medicine.   You have unusual sleepiness, confusion, or personality changes.   You throw up (vomit).   You have a persistent nosebleed.    You have double vision or blurred vision.   You have fluid drainage from your nose or ear.   You have difficulty walking or using your arms or legs.  MAKE SURE YOU:   Understand these instructions.  Will watch your condition.  Will get help right away if you are not doing well or get worse.   This information is not intended to replace advice given to you by your health care provider. Make sure you discuss any questions you have with your health care provider.   Document Released: 06/01/2004 Document Revised: 05/15/2014 Document Reviewed: 12/05/2012 Elsevier Interactive Patient Education [employer].

## 2015-04-08 NOTE — ED Notes (Signed)
Patient transported to CT 

## 2015-04-08 NOTE — ED Provider Notes (Signed)
CSN: [id number]     Arrival date & time 04/08/15  [id number] History  By signing my name below, I, Sonum Patel, attest that this documentation has been prepared under the direction and in the presence of Merryl Hacker, MD. Electronically Signed: Sonum Patel, Education administrator. 04/08/2015. 3:49 AM.    Chief Complaint  Patient presents with  . Fall    The history is provided by the patient. No language interpreter was used.     HPI Comments: [name] is a 81 y.o. female who presents from [place] to the Emergency Department complaining of a fall that occurred PTA. Patient states she fell while her nurse aide was assisting her into bed. She reports falling face first, striking her left forehead and orbital area on the floor. She presents with left sided facial bruising and abrasions but denies being in significant pain. She denies LOC. She denies associated neck pain. She is unsure of last tetanus update.   Past Medical History  Diagnosis Date  . PE (pulmonary embolism)   . Subarachnoid hemorrhage ([place])   . Spastic hemiplegia affecting nondominant side ([place])   . Intracerebral hemorrhage ([place])   . Hypertension   . Arthritis   . Stroke ([place]) 3/12  . Gastrointestinal problem   . Depression   . Breast CA ([place])     mastectomy  . Colon cancer ([place])   . Tremor     benign essential  . Rheumatoid arthritis(714.0)   . Hypothyroidism   . Dysrhythmia     palpitation  . Lymphedema of arm     right  . Atrophic vaginitis   . Osteoporosis   . Multinodular goiter   . Menieres disease 2002  . Thrombocytopenia ([place]) 2002  . Gastroparesis 7/03  . Colon cancer ([place]) 2007  . Pulmonary embolism ([place]) 2003    chronic coumadin  . CVA (cerebral vascular accident) ([place])   . Lymphedema of upper extremity 12/14/2014   Past Surgical History  Procedure Laterality Date  . Fracture surgery  2010    rt wrist  . Mastectomy      bilat mastectomies  . Breast surgery    . Joint replacement       Bilateral total hip replacement  . Hardware removal  03/29/2012    Procedure: HARDWARE REMOVAL;  Surgeon: Alta Corning, MD;  Location: Home;  Service: Orthopedics;  Laterality: Left;  Screw Removal Left Foot  . Shoulder arthroscopy Right   . Thyroidectomy    . Total hip arthroplasty    . Cholecystectomy    . Tonsillectomy    . Left ankle      ruptured achilles tendon  . Carpal tunnel release Bilateral   . Thoracic outlet surgery Bilateral   . Ulnar nerve transposition Right   . Knee surgery Bilateral   . Lumbar laminectomy    . Nasal sinus surgery    . Cervical laminectomy    . Bunionectomy Right   . Right wrist    . Squamous cell carcinoma resec     Family History  Problem Relation Age of Onset  . Cancer Father   . Heart failure Sister   . Tremor Maternal Grandmother   . Tremor Maternal Grandfather   . Tremor Paternal Grandmother   . Tremor Paternal Grandfather    Social History  Substance Use Topics  . Smoking status: Former Smoker    Quit date: 02/25/1970  . Smokeless tobacco: Never Used  . Alcohol  Use: No     Comment: occ wine   OB History    Gravida Para Term Preterm AB TAB SAB Ectopic Multiple Living   4 4   0     4     Review of Systems  HENT: Negative for dental problem.   Respiratory: Negative for shortness of breath.   Cardiovascular: Negative for chest pain.  Musculoskeletal: Negative for neck pain.  Skin: Positive for color change ( bruising) and wound (abrasions).  Neurological: Positive for headaches. Negative for syncope.  All other systems reviewed and are negative.     Allergies  Demerol; Fentanyl; Metoclopramide; Metoclopramide hcl; Morphine and related; Nsaids; Oxycodone-acetaminophen; Pentazocine; Pentazocine lactate; and Versed  Home Medications   Prior to Admission medications   Medication Sig Start Date End Date Taking? Authorizing Provider  acetaminophen (TYLENOL) 500 MG tablet Take 500 mg by mouth 3  (three) times daily.    Yes Historical Provider, MD  albuterol (PROVENTIL HFA;VENTOLIN HFA) 108 (90 BASE) MCG/ACT inhaler Inhale 2 puffs into the lungs every 6 (six) hours as needed for wheezing or shortness of breath.   Yes Historical Provider, MD  amoxicillin (AMOXIL) 500 MG capsule Take 2,000 mg by mouth See admin instructions. 1 hour prior to dental appointment 03/10/14  Yes Historical Provider, MD  aspirin 81 MG tablet Take 81 mg by mouth daily.   Yes Historical Provider, MD  carboxymethylcellulose (REFRESH) 1 % ophthalmic solution Apply 1 drop to eye at bedtime.    Yes Historical Provider, MD  Cholecalciferol (VITAMIN D) 2000 UNITS CAPS Take 1 capsule by mouth daily.   Yes Historical Provider, MD  colestipol (COLESTID) 1 G tablet Take 1 g by mouth 2 (two) times daily.   Yes Historical Provider, MD  CRANBERRY PO Take 425 mg by mouth daily.   Yes Historical Provider, MD  furosemide (LASIX) 20 MG tablet Take 10 mg by mouth daily.    Yes Historical Provider, MD  hydroxychloroquine (PLAQUENIL) 200 MG tablet Take 200 mg by mouth 2 (two) times daily.    Yes Historical Provider, MD  Infant Care Products [facility] EX) Apply topically daily as needed (for skin irratation).    Yes Historical Provider, MD  LACTASE PO Take 2 tablets by mouth 3 (three) times daily with meals.    Yes Historical Provider, MD  levothyroxine (SYNTHROID, LEVOTHROID) 200 MCG tablet Take 200 mcg by mouth daily before breakfast.    Yes Historical Provider, MD  LORazepam (ATIVAN) 0.5 MG tablet Take 1 tablet (0.5 mg total) by mouth 2 (two) times daily. Hold for sedation 08/26/13  Yes Delfina Redwood, MD  LORazepam (ATIVAN) 0.5 MG tablet Take 0.25 mg by mouth daily as needed for anxiety.   Yes Historical Provider, MD  MINERAL OIL PO Place 2 drops into both ears once a week.    Yes Historical Provider, MD  nystatin ointment (MYCOSTATIN) Apply 1 application topically as needed.   Yes Historical Provider, MD  Petrolatum ([place]) OINT Apply topically as needed (for skin irritation).   Yes Historical Provider, MD  predniSONE (DELTASONE) 5 MG tablet Take 5 mg by mouth daily with breakfast.  03/25/13  Yes Historical Provider, MD  primidone (MYSOLINE) 250 MG tablet Take 0.5 tablets (125 mg total) by mouth 2 (two) times daily. Take with 1 tablet of Primidone 50mg  07/23/14  Yes Kathrynn Ducking, MD  primidone (MYSOLINE) 50 MG tablet Take 1 tablet (50 mg total) by mouth 2 (two) times daily. Take with 1/2  tablet Primidone 250 mg twice daily 07/18/14  Yes Kathrynn Ducking, MD  sertraline (ZOLOFT) 100 MG tablet Take 150 mg by mouth daily.    Yes Historical Provider, MD  spiritus frumenti (ETHYL ALCOHOL) SOLN Take 1 each by mouth every evening. Wine (3 ounces)   Yes Historical Provider, MD  HYDROcodone-acetaminophen (NORCO/VICODIN) 5-325 MG tablet Take 1 tablet by mouth every 6 (six) hours as needed. 04/08/15   Merryl Hacker, MD   BP 134/76 mmHg  Pulse 82  Temp(Src) 98.6 F (37 C) (Oral)  Resp 12  Ht 5\' 8"  (1.727 m)  Wt 147 lb (66.679 kg)  BMI 22.36 kg/m2  SpO2 95%  LMP 06/08/1986 Physical Exam  Constitutional: She is oriented to person, place, and time. She appears well-developed and well-nourished. No distress.  HENT:  Head: Normocephalic and atraumatic.  Mouth/Throat: Oropharynx is clear and moist.  Extensive hematoma noted over the left frontal region and orbit, abrasion noted over the left forehead, midface stable  Eyes: Pupils are equal, round, and reactive to light.  Pupils 3 mm reactive bilaterally, extraocular movements intact  Neck: Normal range of motion. Neck supple.  No midline tenderness, step-off, or deformity  Cardiovascular: Normal rate, regular rhythm and normal heart sounds.   Pulmonary/Chest: Effort normal. No respiratory distress. She has no wheezes.  Abdominal: Soft. Bowel sounds are normal. There is no tenderness. There is no rebound.  Musculoskeletal: She exhibits no edema.  Normal  range of motion of the bilateral hips and knees, gait testing deferred, no obvious deformities  Neurological: She is alert and oriented to person, place, and time.  Skin: Skin is warm and dry.  Psychiatric: She has a normal mood and affect.  Nursing note and vitals reviewed.   ED Course  Procedures (including critical care time)  DIAGNOSTIC STUDIES: Oxygen Saturation is 96% on RA, adequate by my interpretation.    COORDINATION OF CARE: 3:55 AM Discussed treatment plan with pt at bedside and pt agreed to plan.   Labs Review Labs Reviewed  CBG MONITORING, ED - Abnormal; Notable for the following:    Glucose-Capillary 126 (*)    All other components within normal limits    Imaging Review Ct Head Wo Contrast  04/08/2015  CLINICAL DATA:  [place] from bed, striking her head. EXAM: CT HEAD WITHOUT CONTRAST CT MAXILLOFACIAL WITHOUT CONTRAST TECHNIQUE: Multidetector CT imaging of the head and maxillofacial structures were performed using the standard protocol without intravenous contrast. Multiplanar CT image reconstructions of the maxillofacial structures were also generated. COMPARISON:  08/23/2013 FINDINGS: CT HEAD FINDINGS There is no intracranial hemorrhage or extra-axial fluid collection. There is encephalomalacia due to remote right frontal infarction. There is moderately severe generalized atrophy. There is white matter hypodensity consistent with small vessel disease. There is a large left frontal scalp hematoma. Calvarium and skullbase are intact. CT MAXILLOFACIAL FINDINGS No maxillofacial fractures are evident. Orbits are intact. Zygomatic arches and pterygoid plates are intact. Large left frontal scalp hematoma. Optic globes are intact. IMPRESSION: 1. Negative for acute intracranial traumatic injury. There is severe generalized atrophy, small vessel disease and remote right frontal infarction. 2. Large left frontal scalp hematoma. 3. Negative for acute maxillofacial fracture. Electronically  Signed   By: Andreas Newport M.D.   On: 04/08/2015 05:52   Ct Maxillofacial Wo Cm  04/08/2015  CLINICAL DATA:  [place] from bed, striking her head. EXAM: CT HEAD WITHOUT CONTRAST CT MAXILLOFACIAL WITHOUT CONTRAST TECHNIQUE: Multidetector CT imaging of the head and maxillofacial structures were  performed using the standard protocol without intravenous contrast. Multiplanar CT image reconstructions of the maxillofacial structures were also generated. COMPARISON:  08/23/2013 FINDINGS: CT HEAD FINDINGS There is no intracranial hemorrhage or extra-axial fluid collection. There is encephalomalacia due to remote right frontal infarction. There is moderately severe generalized atrophy. There is white matter hypodensity consistent with small vessel disease. There is a large left frontal scalp hematoma. Calvarium and skullbase are intact. CT MAXILLOFACIAL FINDINGS No maxillofacial fractures are evident. Orbits are intact. Zygomatic arches and pterygoid plates are intact. Large left frontal scalp hematoma. Optic globes are intact. IMPRESSION: 1. Negative for acute intracranial traumatic injury. There is severe generalized atrophy, small vessel disease and remote right frontal infarction. 2. Large left frontal scalp hematoma. 3. Negative for acute maxillofacial fracture. Electronically Signed   By: Andreas Newport M.D.   On: 04/08/2015 05:52   I have personally reviewed and evaluated these images and lab results as part of my medical decision-making.   EKG Interpretation   Date/Time:  Thursday April 08 2015 03:43:40 EST Ventricular Rate:  85 PR Interval:  235 QRS Duration: 115 QT Interval:  414 QTC Calculation: 492 R Axis:   -57 Text Interpretation:  Sinus rhythm Prolonged PR interval Probable left  atrial enlargement Left anterior fascicular block LVH with secondary  repolarization abnormality Rate faster But otherwise similar to prior  Confirmed by Haddon Fyfe  MD, Loma Sousa ([id number]) on 04/08/2015 4:20:57 AM       MDM   Final diagnoses:  Fall, initial encounter  Facial hematoma, initial encounter    Patient presents following a fall. Described as mechanical and was witnessed. Patient is otherwise nontoxic. No syncope. She has evidence of extensive hematoma and bruising over the left side of the face. Extraocular movements are intact and low suspicion at this time for entrapment. Patient was given Tylenol. Tetanus was updated. She has an abrasion over the forehead which is not amenable to suturing.  CT scan negative for intracranial traumatic injury or facial fracture. Updated the patient and her son. We'll discharge with pain medication. Topical antibiotic ointment to the abrasion.  After history, exam, and medical workup I feel the patient has been appropriately medically screened and is safe for discharge home. Pertinent diagnoses were discussed with the patient. Patient was given return precautions.  I personally performed the services described in this documentation, which was scribed in my presence. The recorded information has been reviewed and is accurate.   Merryl Hacker, MD 04/08/15 [phone]

## 2015-04-08 NOTE — ED Notes (Signed)
PTAR CALLED @ [id number].

## 2015-04-15 ENCOUNTER — Emergency Department (HOSPITAL_COMMUNITY): Payer: Medicare Other

## 2015-04-15 ENCOUNTER — Emergency Department (HOSPITAL_COMMUNITY)
Admission: EM | Admit: 2015-04-15 | Discharge: 2015-04-15 | Disposition: A | Discharge status: Home / Self Care | Payer: Medicare Other | Attending: Emergency Medicine | Admitting: Emergency Medicine

## 2015-04-15 ENCOUNTER — Encounter (HOSPITAL_COMMUNITY): Payer: Self-pay

## 2015-04-15 DIAGNOSIS — S0083XD Contusion of other part of head, subsequent encounter: Secondary | ICD-10-CM | POA: Insufficient documentation

## 2015-04-15 DIAGNOSIS — Z8719 Personal history of other diseases of the digestive system: Secondary | ICD-10-CM | POA: Diagnosis not present

## 2015-04-15 DIAGNOSIS — Z8742 Personal history of other diseases of the female genital tract: Secondary | ICD-10-CM | POA: Diagnosis not present

## 2015-04-15 DIAGNOSIS — E039 Hypothyroidism, unspecified: Secondary | ICD-10-CM | POA: Diagnosis not present

## 2015-04-15 DIAGNOSIS — Z853 Personal history of malignant neoplasm of breast: Secondary | ICD-10-CM | POA: Insufficient documentation

## 2015-04-15 DIAGNOSIS — W1839XD Other fall on same level, subsequent encounter: Secondary | ICD-10-CM | POA: Insufficient documentation

## 2015-04-15 DIAGNOSIS — Z87891 Personal history of nicotine dependence: Secondary | ICD-10-CM | POA: Insufficient documentation

## 2015-04-15 DIAGNOSIS — R079 Chest pain, unspecified: Secondary | ICD-10-CM | POA: Diagnosis present

## 2015-04-15 DIAGNOSIS — I10 Essential (primary) hypertension: Secondary | ICD-10-CM | POA: Insufficient documentation

## 2015-04-15 DIAGNOSIS — M1990 Unspecified osteoarthritis, unspecified site: Secondary | ICD-10-CM | POA: Insufficient documentation

## 2015-04-15 DIAGNOSIS — M069 Rheumatoid arthritis, unspecified: Secondary | ICD-10-CM | POA: Diagnosis not present

## 2015-04-15 DIAGNOSIS — Z8673 Personal history of transient ischemic attack (TIA), and cerebral infarction without residual deficits: Secondary | ICD-10-CM | POA: Insufficient documentation

## 2015-04-15 DIAGNOSIS — Z86711 Personal history of pulmonary embolism: Secondary | ICD-10-CM | POA: Insufficient documentation

## 2015-04-15 DIAGNOSIS — Z8669 Personal history of other diseases of the nervous system and sense organs: Secondary | ICD-10-CM | POA: Insufficient documentation

## 2015-04-15 DIAGNOSIS — Z85038 Personal history of other malignant neoplasm of large intestine: Secondary | ICD-10-CM | POA: Insufficient documentation

## 2015-04-15 DIAGNOSIS — Z7952 Long term (current) use of systemic steroids: Secondary | ICD-10-CM | POA: Insufficient documentation

## 2015-04-15 DIAGNOSIS — Z79899 Other long term (current) drug therapy: Secondary | ICD-10-CM | POA: Diagnosis not present

## 2015-04-15 DIAGNOSIS — Z7982 Long term (current) use of aspirin: Secondary | ICD-10-CM | POA: Insufficient documentation

## 2015-04-15 DIAGNOSIS — R0789 Other chest pain: Secondary | ICD-10-CM | POA: Insufficient documentation

## 2015-04-15 DIAGNOSIS — Z862 Personal history of diseases of the blood and blood-forming organs and certain disorders involving the immune mechanism: Secondary | ICD-10-CM | POA: Diagnosis not present

## 2015-04-15 DIAGNOSIS — F329 Major depressive disorder, single episode, unspecified: Secondary | ICD-10-CM | POA: Insufficient documentation

## 2015-04-15 NOTE — ED Provider Notes (Signed)
CSN: [id number]     Arrival date & time 04/15/15  [id number] History  By signing my name below, I, Eustaquio Maize, attest that this documentation has been prepared under the direction and in the presence of Veryl Speak, MD. Electronically Signed: Eustaquio Maize, ED Scribe. 04/15/2015. 3:34 AM.   Chief Complaint  Patient presents with  . Rib Injury   The history is provided by the patient. No language interpreter was used.     HPI Comments: [name] is a 81 y.o. female brought in by ambulance, who presents to the Emergency Department complaining of sudden onset right rib pain that began tonight. Pt states that she got up to use the restroom when she felt a sharp pain in her right ribs, causing her to call EMS. Pt also complains of shortness of breath. She states that the pain only lasted for a short period of time. Pt is currently pain free. Denies any other associated symptoms. Pt does mention falling on 04/08/2015 (approximately 5 days ago). She was seen in the ED at that time but was not complaining of rib pain at that time and does not believe this pain is related to the fall.   Past Medical History  Diagnosis Date  . PE (pulmonary embolism)   . Subarachnoid hemorrhage ([place])   . Spastic hemiplegia affecting nondominant side ([place])   . Intracerebral hemorrhage ([place])   . Hypertension   . Arthritis   . Stroke ([place]) 3/12  . Gastrointestinal problem   . Depression   . Breast CA ([place])     mastectomy  . Colon cancer ([place])   . Tremor     benign essential  . Rheumatoid arthritis(714.0)   . Hypothyroidism   . Dysrhythmia     palpitation  . Lymphedema of arm     right  . Atrophic vaginitis   . Osteoporosis   . Multinodular goiter   . Menieres disease 2002  . Thrombocytopenia ([place]) 2002  . Gastroparesis 7/03  . Colon cancer ([place]) 2007  . Pulmonary embolism ([place]) 2003    chronic coumadin  . CVA (cerebral vascular accident) ([place])   . Lymphedema of upper extremity 12/14/2014   Past  Surgical History  Procedure Laterality Date  . Fracture surgery  2010    rt wrist  . Mastectomy      bilat mastectomies  . Breast surgery    . Joint replacement      Bilateral total hip replacement  . Hardware removal  03/29/2012    Procedure: HARDWARE REMOVAL;  Surgeon: Alta Corning, MD;  Location: [facility];  Service: Orthopedics;  Laterality: Left;  Screw Removal Left Foot  . Shoulder arthroscopy Right   . Thyroidectomy    . Total hip arthroplasty    . Cholecystectomy    . Tonsillectomy    . Left ankle      ruptured achilles tendon  . Carpal tunnel release Bilateral   . Thoracic outlet surgery Bilateral   . Ulnar nerve transposition Right   . Knee surgery Bilateral   . Lumbar laminectomy    . Nasal sinus surgery    . Cervical laminectomy    . Bunionectomy Right   . Right wrist    . Squamous cell carcinoma resec     Family History  Problem Relation Age of Onset  . Cancer Father   . Heart failure Sister   . Tremor Maternal Grandmother   . Tremor Maternal Grandfather   . Tremor  Paternal Grandmother   . Tremor Paternal Grandfather    Social History  Substance Use Topics  . Smoking status: Former Smoker    Quit date: 02/25/1970  . Smokeless tobacco: Never Used  . Alcohol Use: No     Comment: occ wine   OB History    Gravida Para Term Preterm AB TAB SAB Ectopic Multiple Living   4 4   0     4     Review of Systems  A complete 10 system review of systems was obtained and all systems are negative except as noted in the HPI and PMH.   Allergies  Demerol; Fentanyl; Metoclopramide; Metoclopramide hcl; Morphine and related; Nsaids; Oxycodone-acetaminophen; Pentazocine; Pentazocine lactate; and Versed  Home Medications   Prior to Admission medications   Medication Sig Start Date End Date Taking? Authorizing Provider  acetaminophen (TYLENOL) 500 MG tablet Take 500 mg by mouth 3 (three) times daily.     Historical Provider, MD  albuterol (PROVENTIL  HFA;VENTOLIN HFA) 108 (90 BASE) MCG/ACT inhaler Inhale 2 puffs into the lungs every 6 (six) hours as needed for wheezing or shortness of breath.    Historical Provider, MD  amoxicillin (AMOXIL) 500 MG capsule Take 2,000 mg by mouth See admin instructions. 1 hour prior to dental appointment 03/10/14   Historical Provider, MD  aspirin 81 MG tablet Take 81 mg by mouth daily.    Historical Provider, MD  carboxymethylcellulose (REFRESH) 1 % ophthalmic solution Apply 1 drop to eye at bedtime.     Historical Provider, MD  Cholecalciferol (VITAMIN D) 2000 UNITS CAPS Take 1 capsule by mouth daily.    Historical Provider, MD  colestipol (COLESTID) 1 G tablet Take 1 g by mouth 2 (two) times daily.    Historical Provider, MD  CRANBERRY PO Take 425 mg by mouth daily.    Historical Provider, MD  furosemide (LASIX) 20 MG tablet Take 10 mg by mouth daily.     Historical Provider, MD  HYDROcodone-acetaminophen (NORCO/VICODIN) 5-325 MG tablet Take 1 tablet by mouth every 6 (six) hours as needed. 04/08/15   Merryl Hacker, MD  hydroxychloroquine (PLAQUENIL) 200 MG tablet Take 200 mg by mouth 2 (two) times daily.     Historical Provider, MD  Infant Care Products [facility] EX) Apply topically daily as needed (for skin irratation).     Historical Provider, MD  LACTASE PO Take 2 tablets by mouth 3 (three) times daily with meals.     Historical Provider, MD  levothyroxine (SYNTHROID, LEVOTHROID) 200 MCG tablet Take 200 mcg by mouth daily before breakfast.     Historical Provider, MD  LORazepam (ATIVAN) 0.5 MG tablet Take 1 tablet (0.5 mg total) by mouth 2 (two) times daily. Hold for sedation 08/26/13   Delfina Redwood, MD  LORazepam (ATIVAN) 0.5 MG tablet Take 0.25 mg by mouth daily as needed for anxiety.    Historical Provider, MD  MINERAL OIL PO Place 2 drops into both ears once a week.     Historical Provider, MD  nystatin ointment (MYCOSTATIN) Apply 1 application topically as needed.    Historical Provider, MD   Petrolatum ([place]) OINT Apply topically as needed (for skin irritation).    Historical Provider, MD  predniSONE (DELTASONE) 5 MG tablet Take 5 mg by mouth daily with breakfast.  03/25/13   Historical Provider, MD  primidone (MYSOLINE) 250 MG tablet Take 0.5 tablets (125 mg total) by mouth 2 (two) times daily. Take with 1 tablet of Primidone  50mg  07/23/14   Kathrynn Ducking, MD  primidone (MYSOLINE) 50 MG tablet Take 1 tablet (50 mg total) by mouth 2 (two) times daily. Take with 1/2 tablet Primidone 250 mg twice daily 07/18/14   Kathrynn Ducking, MD  sertraline (ZOLOFT) 100 MG tablet Take 150 mg by mouth daily.     Historical Provider, MD  spiritus frumenti (ETHYL ALCOHOL) SOLN Take 1 each by mouth every evening. Wine (3 ounces)    Historical Provider, MD   Triage VItals: BP 139/77 mmHg  Pulse 87  Temp(Src) 99 F (37.2 C) (Oral)  Resp 13  SpO2 92%  LMP 06/08/1986   Physical Exam  Constitutional: She is oriented to person, place, and time. She appears well-developed and well-nourished. No distress.  HENT:  Head: Normocephalic.  There are old contusions to the left forehead and left cheek.   Eyes: EOM are normal.  Neck: Normal range of motion.  Cardiovascular: Normal rate, regular rhythm and normal heart sounds.   Pulmonary/Chest: Effort normal and breath sounds normal. She exhibits no tenderness.  No crepitus.   Abdominal: Soft. She exhibits no distension. There is no tenderness.  Musculoskeletal: Normal range of motion.  Neurological: She is alert and oriented to person, place, and time.  Skin: Skin is warm and dry.  Psychiatric: She has a normal mood and affect. Judgment normal.  Nursing note and vitals reviewed.   ED Course  Procedures (including critical care time)  DIAGNOSTIC STUDIES: Oxygen Saturation is 92% on RA, low by my interpretation.    COORDINATION OF CARE: 3:33 AM-Discussed treatment plan which includes DG R Ribs with pt at bedside and pt agreed to  plan.   Labs Review Labs Reviewed - No data to display  Imaging Review No results found. I have personally reviewed and evaluated these images as part of my medical decision-making.   EKG Interpretation None      MDM   Final diagnoses:  None    Patient is a 81 year old female with extensive past medical history. She is brought from a nursing facility for evaluation of right sided chest pain. This started abruptly when she is attempting to ambulate this evening. It lasted for several minutes, and has now resolved. She has no complaints. She denies any chest pain, shortness of breath, cough, or other symptoms. She did recently experience a fall and has multiple bruises to her face. She was seen and evaluated for this several days ago. She was not complaining of any rib injuries at that time.  Her x-rays today are negative. There is no evidence for any rib fracture, pneumothorax, or infiltrate. Due to the short, atypical nature of her symptoms, I highly suspect a musculoskeletal etiology. I doubt anything acute such as pulmonary embolism. At this point, I feel as though she is appropriate for return to the facility from which she came. She will be discharged with instructions to return if her symptoms worsen or change.   I personally performed the services described in this documentation, which was scribed in my presence. The recorded information has been reviewed and is accurate.       Veryl Speak, MD 04/15/15 [phone]

## 2015-04-15 NOTE — ED Notes (Signed)
MD at bedside. 

## 2015-04-15 NOTE — ED Notes (Signed)
Patient transported to X-ray 

## 2015-04-15 NOTE — Discharge Instructions (Signed)
Return to the emergency department if symptoms significantly worsen or change.   Chest Wall Pain Chest wall pain is pain in or around the bones and muscles of your chest. Sometimes, an injury causes this pain. Sometimes, the cause may not be known. This pain may take several weeks or longer to get better. HOME CARE INSTRUCTIONS  Pay attention to any changes in your symptoms. Take these actions to help with your pain:   Rest as told by your health care provider.   Avoid activities that cause pain. These include any activities that use your chest muscles or your abdominal and side muscles to lift heavy items.   If directed, apply ice to the painful area:  Put ice in a plastic bag.  Place a towel between your skin and the bag.  Leave the ice on for 20 minutes, 2-3 times per day.  Take over-the-counter and prescription medicines only as told by your health care provider.  Do not use tobacco products, including cigarettes, chewing tobacco, and e-cigarettes. If you need help quitting, ask your health care provider.  Keep all follow-up visits as told by your health care provider. This is important. SEEK MEDICAL CARE IF:  You have a fever.  Your chest pain becomes worse.  You have new symptoms. SEEK IMMEDIATE MEDICAL CARE IF:  You have nausea or vomiting.  You feel sweaty or light-headed.  You have a cough with phlegm (sputum) or you cough up blood.  You develop shortness of breath.   This information is not intended to replace advice given to you by your health care provider. Make sure you discuss any questions you have with your health care provider.   Document Released: 04/24/2005 Document Revised: 01/13/2015 Document Reviewed: 07/20/2014 Elsevier Interactive Patient Education [employer].

## 2015-04-15 NOTE — ED Notes (Signed)
Per EMS pt got up this morning and felt sharp pain in right rib cage area; Pt had fall on 04/08/15 with no obvious complications from fall; pt presents with bruise on left side of face; pt denies pain on arrival; Pt is from [facility];

## 2015-06-28 ENCOUNTER — Ambulatory Visit (INDEPENDENT_AMBULATORY_CARE_PROVIDER_SITE_OTHER): Payer: Medicare Other | Admitting: Nurse Practitioner

## 2015-06-28 ENCOUNTER — Encounter: Payer: Self-pay | Admitting: Nurse Practitioner

## 2015-06-28 ENCOUNTER — Telehealth: Payer: Self-pay | Admitting: Nurse Practitioner

## 2015-06-28 VITALS — BP 131/75 | HR 77 | Ht 68.0 in | Wt 147.0 lb

## 2015-06-28 DIAGNOSIS — R269 Unspecified abnormalities of gait and mobility: Secondary | ICD-10-CM

## 2015-06-28 DIAGNOSIS — I10 Essential (primary) hypertension: Secondary | ICD-10-CM | POA: Diagnosis not present

## 2015-06-28 DIAGNOSIS — G250 Essential tremor: Secondary | ICD-10-CM

## 2015-06-28 NOTE — Progress Notes (Signed)
I have read the note, and I agree with the clinical assessment and plan.  Elianis Fischbach KEITH   

## 2015-06-28 NOTE — Progress Notes (Signed)
[facility]  PATIENT: [name] DOB: [birth year]   REASON FOR VISIT: Follow-up for abnormality of gait essential tremor, memory disorder HISTORY FROM: Patient and son    HISTORY OF PRESENT ILLNESS:[name] is a 82 year old right-handed white female with a history of an essential tremor, and a severe gait disorder. The patient is essentially nonambulatory at this point. She can walk with assistance for short distances, she mainly is in a wheelchair. The patient has been residing in [facility] [place] with her husband. The patient does have some memory issues, and when last seen by Dr. Jannifer Franklin 03/25/15 she had  developed increasing problems with delusional thinking. The patient will get a concept in her head and obcess over it. This has gotten somewhat better since her Zoloft was increased to 150 mg daily. She also has Ativan to take  The patient is sleeping fairly well at night and appetite is good. The patient has not had any recent falls, but she did fall 2 months ago and went to the ER..CT of the head was negative for acute intracranial traumatic injury. There is severe generalized atrophy, small vessel disease and remote right frontal infarction. She is on Mysoline for the tremor, . The patient does require some assistance with activities of daily living she has paid help 4 hours a day to assist with this . Son says she has had both shoulders injected since last seen. She is not a surgical candidate. The patient comes into the office today for an evaluation.Marland Kitchen   REVIEW OF SYSTEMS: Full 14 system review of systems performed and notable only for those listed, all others are neg:  Constitutional: neg  Cardiovascular: neg Ear/Nose/Throat: neg  Skin: neg Eyes: neg Respiratory: neg Gastroitestinal: neg  Hematology/Lymphatic: neg  Endocrine: neg Musculoskeletal: Joint pain walking difficulty Allergy/Immunology: neg Neurological: Memory loss,  tremor Psychiatric: neg Sleep : neg   ALLERGIES: Allergies  Allergen Reactions  . Demerol   . Fentanyl   . Metoclopramide Other (See Comments)    crazy  . Metoclopramide Hcl   . Morphine And Related   . Nsaids     Upset stomach  . Oxycodone-Acetaminophen     Other reaction(s): Hallucinations  . Pentazocine     Other reaction(s): Hallucinations  . Pentazocine Lactate   . Versed [Midazolam] Other (See Comments)    HOME MEDICATIONS: Outpatient Prescriptions Prior to Visit  Medication Sig Dispense Refill  . acetaminophen (TYLENOL) 500 MG tablet Take 500 mg by mouth 3 (three) times daily. Every other day.    . albuterol (PROVENTIL HFA;VENTOLIN HFA) 108 (90 BASE) MCG/ACT inhaler Inhale 2 puffs into the lungs every 6 (six) hours as needed for wheezing or shortness of breath.    Marland Kitchen amoxicillin (AMOXIL) 500 MG capsule Take 2,000 mg by mouth See admin instructions. 1 hour prior to dental appointment    . aspirin 81 MG tablet Take 81 mg by mouth daily.    . carboxymethylcellulose (REFRESH) 1 % ophthalmic solution Apply 1 drop to eye at bedtime.     . Cholecalciferol (VITAMIN D) 2000 UNITS CAPS Take 1 capsule by mouth daily.    . colestipol (COLESTID) 1 G tablet Take 1 g by mouth 2 (two) times daily.    Marland Kitchen CRANBERRY PO Take 425 mg by mouth daily.    . furosemide (LASIX) 20 MG tablet Take 10 mg by mouth daily.     Marland Kitchen HYDROcodone-acetaminophen (NORCO/VICODIN) 5-325 MG tablet Take 1 tablet by mouth every 6 (  six) hours as needed. 10 tablet 0  . hydroxychloroquine (PLAQUENIL) 200 MG tablet Take 200 mg by mouth 2 (two) times daily.     . [employer] Care [employer] [facility] [facility] [employer] EX) Apply topically daily as needed (for skin irratation).     Marland Kitchen LACTASE PO Take 2 tablets by mouth 3 (three) times daily with meals.     Marland Kitchen levothyroxine (SYNTHROID, LEVOTHROID) 200 MCG tablet Take 200 mcg by mouth daily before breakfast.     . LORazepam (ATIVAN) 0.5 MG tablet Take 1 tablet (0.5 mg total) by mouth 2 (two) times  daily. Hold for sedation 30 tablet 0  . LORazepam (ATIVAN) 0.5 MG tablet Take 0.25 mg by mouth daily as needed for anxiety.    Marland Kitchen MINERAL OIL PO Place 2 drops into both ears once a week.     . nystatin ointment (MYCOSTATIN) Apply 1 application topically as needed.    Marland Kitchen Petrolatum ([place]) OINT Apply topically as needed (for skin irritation).    . predniSONE (DELTASONE) 5 MG tablet Take 5 mg by mouth daily with breakfast.     . primidone (MYSOLINE) 250 MG tablet Take 0.5 tablets (125 mg total) by mouth 2 (two) times daily. Take with 1 tablet of Primidone 50mg  30 tablet 6  . primidone (MYSOLINE) 50 MG tablet Take 1 tablet (50 mg total) by mouth 2 (two) times daily. Take with 1/2 tablet Primidone 250 mg twice daily 60 tablet 6  . sertraline (ZOLOFT) 100 MG tablet Take 150 mg by mouth daily.     Marland Kitchen spiritus frumenti (ETHYL ALCOHOL) SOLN Take 1 each by mouth every evening. Wine (3 ounces)     No facility-administered medications prior to visit.    PAST MEDICAL HISTORY: Past Medical History  Diagnosis Date  . PE (pulmonary embolism)   . Subarachnoid hemorrhage ([place])   . Spastic hemiplegia affecting nondominant side ([place])   . Intracerebral hemorrhage ([place])   . Hypertension   . Arthritis   . Stroke ([place]) 3/12  . Gastrointestinal problem   . Depression   . Breast CA ([place])     mastectomy  . Colon cancer ([place])   . Tremor     benign essential  . Rheumatoid arthritis(714.0)   . Hypothyroidism   . Dysrhythmia     palpitation  . Lymphedema of arm     right  . Atrophic vaginitis   . Osteoporosis   . Multinodular goiter   . Menieres disease 2002  . Thrombocytopenia ([employer]) 2002  . Gastroparesis 7/03  . Colon cancer ([place]) 2007  . Pulmonary embolism ([place]) 2003    chronic coumadin  . CVA (cerebral vascular accident) ([place])   . Lymphedema of upper extremity 12/14/2014    PAST SURGICAL HISTORY: Past Surgical History  Procedure Laterality Date  . Fracture surgery  2010    rt wrist   . Mastectomy      bilat mastectomies  . Breast surgery    . Joint replacement      Bilateral total hip replacement  . Hardware removal  03/29/2012    Procedure: HARDWARE REMOVAL;  Surgeon: Alta Corning, MD;  Location: [facility];  Service: Orthopedics;  Laterality: Left;  Screw Removal Left Foot  . Shoulder arthroscopy Right   . Thyroidectomy    . Total hip arthroplasty    . Cholecystectomy    . Tonsillectomy    . Left ankle      ruptured achilles tendon  . Carpal tunnel release Bilateral   .  Thoracic outlet surgery Bilateral   . Ulnar nerve transposition Right   . Knee surgery Bilateral   . Lumbar laminectomy    . Nasal sinus surgery    . Cervical laminectomy    . Bunionectomy Right   . Right wrist    . Squamous cell carcinoma resec      FAMILY HISTORY: Family History  Problem Relation Age of Onset  . Cancer Father   . Heart failure Sister   . Tremor Maternal Grandmother   . Tremor Maternal Grandfather   . Tremor Paternal Grandmother   . Tremor Paternal Grandfather     SOCIAL HISTORY: Social History   Social History  . Marital Status: Married    Spouse Name: N/A  . Number of Children: 4  . Years of Education: college   Occupational History  . Retired    Social History Main Topics  . Smoking status: Former Smoker    Quit date: 02/25/1970  . Smokeless tobacco: Never Used  . Alcohol Use: No     Comment: occ wine  . Drug Use: No  . Sexual Activity: No   Other Topics Concern  . Not on file   Social History Narrative   Patient does not drink caffeine.   Patient is right handed.    Resides at [facility].       PHYSICAL EXAM  Filed Vitals:   06/28/15 1109  BP: 131/75  Pulse: 77  Height: 5\' 8"  (1.727 m)  Weight: 147 lb (66.679 kg)   Body mass index is 22.36 kg/(m^2). General: The patient is alert and cooperative at the time of the examination. Skin: No significant peripheral edema is noted. Compressions stockings  bilaterally   Neurologic Exam  Mental status: The patient is alert and oriented x 2 at the time of the examination (not oriented to date). The MMSE reveals a score of 21/30. AFT score is 5.Last 19/30.  Cranial nerves: Facial symmetry is present. Speech is normal, no aphasia or dysarthria is noted. Extraocular movements are full. Visual fields are full. Motor: The patient has good strength in all 4 extremities except poor grip left hand and mild weakness left arm. Sensory examination: Soft touch sensation is symmetric on the face, arms, and legs. Coordination: The patient has good finger-nose-finger and heel-to-shin bilaterally with the right, some difficulty with the left. The patient has some tremors with the left greater than right upper extremity. Gait and station: Requires maximal assistance with trying to stand. She is wheelchair-bound. Reflexes: Deep tendon reflexes are symmetric upper and lower. DIAGNOSTIC DATA (LABS, IMAGING, TESTING) - I reviewed patient records, labs, notes, testing and imaging myself where available.  Lab Results  Component Value Date   WBC 5.6 12/14/2014   HGB 13.1 12/14/2014   HCT 39.3 12/14/2014   MCV 99.4 12/14/2014   PLT 130* 12/14/2014      Component Value Date/Time   NA 146* 12/14/2014 1431   NA 143 06/09/2014 0456   NA 139 10/15/2012 1308   K 4.3 12/14/2014 1431   K 3.7 06/09/2014 0456   CL 105 06/09/2014 0456   CL 103 10/29/2012 1414   CO2 28 12/14/2014 1431   CO2 26 08/25/2013 1047   GLUCOSE 118 12/14/2014 1431   GLUCOSE 114* 06/09/2014 0456   GLUCOSE 142* 10/29/2012 1414   GLUCOSE 129* 10/15/2012 1308   BUN 17.0 12/14/2014 1431   BUN 23 06/09/2014 0456   BUN 11 10/15/2012 1308   CREATININE 0.7 12/14/2014 1431  CREATININE 0.70 06/09/2014 0456   CALCIUM 8.6 12/14/2014 1431   CALCIUM 8.6 08/25/2013 1047   PROT 6.3* 12/14/2014 1431   PROT 6.2 10/15/2012 1308   PROT 5.5* 10/25/2011 1401   ALBUMIN 3.8 12/14/2014 1431   ALBUMIN 4.3  10/15/2012 1308   ALBUMIN 3.7 10/25/2011 1401   AST 18 12/14/2014 1431   AST 22 10/15/2012 1308   ALT 9 12/14/2014 1431   ALT 17 10/15/2012 1308   ALKPHOS 85 12/14/2014 1431   ALKPHOS 63 10/15/2012 1308   BILITOT 0.44 12/14/2014 1431   BILITOT 0.3 10/15/2012 1308   GFRNONAA 87* 08/25/2013 1047   GFRAA >90 08/25/2013 1047     ASSESSMENT AND PLAN 1. Essential tremor 2. Delusional thinking which has improved per son  3. Gait disorder 4. Memory disorder CT of the head 04/08/15 was negative for acute intracranial traumatic injury. There is severe generalized atrophy, small vessel disease and remote right frontal infarction.MMSE today 21/30.  Discussed with Dr. Jannifer Franklin   Continue Zoloft  dose taking 150 mg a day to continue .  Namenda for memory with slow  taper.   She will continue Mysoline for her essential tremor and continue her home exercise program for gait disorder.  She will follow-up in 3 months [name], [facility], [facility], APRN  [facility] [address], [place], [place] [address] [phone]

## 2015-06-28 NOTE — Telephone Encounter (Signed)
Telephone call to son Shanon Brow who is the power of attorney to discuss memory medication. Left message to call me back

## 2015-06-28 NOTE — Patient Instructions (Signed)
Per skilled nsg sheet 

## 2015-06-28 NOTE — Telephone Encounter (Signed)
Pt's son returned Carolyn's call. He can be reached at [phone]

## 2015-06-29 ENCOUNTER — Telehealth: Payer: Self-pay | Admitting: *Deleted

## 2015-06-29 MED ORDER — MEMANTINE HCL 28 X 5 MG & 21 X 10 MG PO TABS: 5.0000 mg | ORAL_TABLET | ORAL | Status: DC

## 2015-06-29 NOTE — Telephone Encounter (Signed)
Telephone call to son Hetty Blend, made him aware that Dr. Jannifer Franklin and I have reviewed her CT of the head from December and significant atrophy was found however no acute findings. Will start Namenda. He is agreeable

## 2015-06-29 NOTE — Telephone Encounter (Signed)
Spoke to [employer] at [facility].  Pt uses [facility] in [place], [employer].  Faxed namenda titration pack to [phone] (confirmed).

## 2015-06-29 NOTE — Telephone Encounter (Signed)
I LM for med tech to call me back from [facility] in [place], [place].  Trying to find out if pt uses their pharmacy [facility] or has her own.   If uses their pharmacy need fax #.

## 2015-07-27 ENCOUNTER — Telehealth: Payer: Self-pay | Admitting: Neurology

## 2015-07-27 NOTE — Telephone Encounter (Signed)
Pt called sts pt mental status has deteriorated over the past several months but has gotten worse the past few weeks. He said she is in assisted living and is convinced they are poisoning the old people and putting salt in the water. She said there is a palm reader predicting death in the old people. He said this is very hard on his dad since they live together at the assisted living. He is wondering if tapering down on the zoloft several months ago and adding the namenda has had an impact on this. [facility] has requested a palliative care RN for assessment and he has agreed to this.

## 2015-07-28 MED ORDER — ARIPIPRAZOLE 2 MG PO TABS: 2.0000 mg | ORAL_TABLET | Freq: Every day | ORAL | Status: DC

## 2015-07-28 NOTE — Telephone Encounter (Signed)
I called the son. The Zoloft has been increased to 150, did not help with the delusional thinking. The patient has more issues with this in the afternoon and evenings. Delusional thinking can be quite difficult to control. I may add a low-dose of Abilify to Zoloft. Having the palliative care nurse talk with the patient may be very reasonable.  The patient resides at [facility].

## 2015-07-29 NOTE — Telephone Encounter (Signed)
Tomi with palliative care [phone] to discuss with [facility] and Abilify.

## 2015-07-29 NOTE — Telephone Encounter (Signed)
Called and talked to Tomi at [facility]. They are trying to stream line meds so do not want to add Abilify. Have stopped Namenda. Depakote added for behavior.

## 2015-09-20 ENCOUNTER — Encounter: Payer: Self-pay | Admitting: Nurse Practitioner

## 2015-09-20 ENCOUNTER — Ambulatory Visit (INDEPENDENT_AMBULATORY_CARE_PROVIDER_SITE_OTHER): Payer: Medicare Other | Admitting: Nurse Practitioner

## 2015-09-20 VITALS — BP 103/52 | HR 69 | Ht 68.0 in

## 2015-09-20 DIAGNOSIS — R269 Unspecified abnormalities of gait and mobility: Secondary | ICD-10-CM | POA: Diagnosis not present

## 2015-09-20 DIAGNOSIS — G250 Essential tremor: Secondary | ICD-10-CM

## 2015-09-20 DIAGNOSIS — I10 Essential (primary) hypertension: Secondary | ICD-10-CM | POA: Diagnosis not present

## 2015-09-20 NOTE — Progress Notes (Signed)
[facility]  PATIENT: [name] DOB: 04/27/1934   REASON FOR VISIT: Follow-up for essential tremor and gait abnormality, memory disorder HISTORY FROM: Patient and son    HISTORY OF PRESENT ILLNESS:: [name], 82 year old female returns for follow-up. She has a history of essential tremor and severe gait disorder. She is essentially nonambulatory and stays in a wheelchair. Since last seen in February, palliative care is involved in her treatment. For her delusional episodes they discontinued Abilify that was ordered by Dr. Jannifer Franklin and instead  placed her on Depakote. Her Namenda was stopped in an effort to streamline her medications. She remains on Mysoline for her tremor. Son reports that she has trouble feeding herself due to her old shoulder problems. She is unable to have surgery. Appetite is reportedly fair. She continues to require assistance with activities of daily living. She returns for reevaluation  06/28/15 CMMs. [name] is a 82 year old right-handed white female with a history of an essential tremor, and a severe gait disorder. The patient is essentially nonambulatory at this point. She can walk with assistance for short distances, she mainly is in a wheelchair. The patient has been residing in [facility] [place] with her husband. The patient does have some memory issues, and when last seen by Dr. Jannifer Franklin 03/25/15 she had developed increasing problems with delusional thinking. The patient will get a concept in her head and obcess over it. This has gotten somewhat better since her Zoloft was increased to 150 mg daily. She also has Ativan to take The patient is sleeping fairly well at night and appetite is good. The patient has not had any recent falls, but she did fall 2 months ago and went to the ER..CT of the head was negative for acute intracranial traumatic injury. There is severe generalized atrophy, small vessel disease and remote right frontal  infarction. She is on Mysoline for the tremor, . The patient does require some assistance with activities of daily living she has paid help 4 hours a day to assist with this . Son says she has had both shoulders injected since last seen. She is not a surgical candidate. The patient comes into the office today for an evaluation.Marland Kitchen   REVIEW OF SYSTEMS: Full 14 system review of systems performed and notable only for those listed, all others are neg:  Constitutional: neg  Cardiovascular: neg Ear/Nose/Throat: neg  Skin: neg Eyes: neg Respiratory: neg Gastroitestinal: neg  Hematology/Lymphatic: neg  Endocrine: neg Musculoskeletal:neg Allergy/Immunology: neg Neurological: neg Psychiatric: neg Sleep : neg   ALLERGIES: Allergies  Allergen Reactions  . Demerol   . Fentanyl   . Metoclopramide Other (See Comments)    crazy  . Metoclopramide Hcl   . Morphine And Related   . Nsaids     Upset stomach  . Oxycodone-Acetaminophen     Other reaction(s): Hallucinations  . Pentazocine     Other reaction(s): Hallucinations  . Pentazocine Lactate   . Versed [Midazolam] Other (See Comments)    HOME MEDICATIONS: Outpatient Prescriptions Prior to Visit  Medication Sig Dispense Refill  . albuterol (PROVENTIL HFA;VENTOLIN HFA) 108 (90 BASE) MCG/ACT inhaler Inhale 2 puffs into the lungs every 6 (six) hours as needed for wheezing or shortness of breath.    Marland Kitchen aspirin 81 MG tablet Take 81 mg by mouth daily.    . Cholecalciferol (VITAMIN D) 2000 UNITS CAPS Take 1 capsule by mouth daily.    . colestipol (COLESTID) 1 G tablet Take 1 g by mouth  2 (two) times daily.    Marland Kitchen CRANBERRY PO Take 425 mg by mouth daily.    . furosemide (LASIX) 20 MG tablet Take 10 mg by mouth daily.     . hydroxychloroquine (PLAQUENIL) 200 MG tablet Take 200 mg by mouth 2 (two) times daily.     . Infant Care Products [facility] EX) Apply topically daily as needed (for skin irratation).     Marland Kitchen LACTASE PO Take 2 tablets by mouth 3  (three) times daily with meals.     Marland Kitchen levothyroxine (SYNTHROID, LEVOTHROID) 200 MCG tablet Take 200 mcg by mouth daily before breakfast.     . LORazepam (ATIVAN) 0.5 MG tablet Take 1 tablet (0.5 mg total) by mouth 2 (two) times daily. Hold for sedation 30 tablet 0  . LORazepam (ATIVAN) 0.5 MG tablet Take 0.25 mg by mouth daily as needed for anxiety.    Marland Kitchen MINERAL OIL PO Place 2 drops into both ears once a week.     . nystatin ointment (MYCOSTATIN) Apply 1 application topically as needed.    Marland Kitchen Petrolatum ([place]) OINT Apply topically as needed (for skin irritation).    . potassium chloride (K-DUR) 10 MEQ tablet Take 10 mEq by mouth every other day.     . predniSONE (DELTASONE) 5 MG tablet Take 5 mg by mouth daily with breakfast.     . primidone (MYSOLINE) 250 MG tablet Take 0.5 tablets (125 mg total) by mouth 2 (two) times daily. Take with 1 tablet of Primidone 50mg  30 tablet 6  . primidone (MYSOLINE) 50 MG tablet Take 1 tablet (50 mg total) by mouth 2 (two) times daily. Take with 1/2 tablet Primidone 250 mg twice daily 60 tablet 6  . sertraline (ZOLOFT) 100 MG tablet Take 100 mg by mouth daily.     Marland Kitchen spiritus frumenti (ETHYL ALCOHOL) SOLN Take 1 each by mouth every evening. Wine (3 ounces)    . amoxicillin (AMOXIL) 500 MG capsule Take 2,000 mg by mouth See admin instructions. Reported on 09/20/2015    . acetaminophen (TYLENOL) 500 MG tablet Take 500 mg by mouth 3 (three) times daily. Reported on 09/20/2015    . ARIPiprazole (ABILIFY) 2 MG tablet Take 1 tablet (2 mg total) by mouth daily. 30 tablet 2  . carboxymethylcellulose (REFRESH) 1 % ophthalmic solution Apply 1 drop to eye at bedtime.     Marland Kitchen HYDROcodone-acetaminophen (NORCO/VICODIN) 5-325 MG tablet Take 1 tablet by mouth every 6 (six) hours as needed. 10 tablet 0  . memantine (NAMENDA TITRATION PAK) tablet pack Take 5 mg by mouth See admin instructions. 5 mg/day for =1 week; 5 mg twice daily for =1 week; 15 mg/day given in 5 mg and 10  mg separated doses for =1 week; then 10 mg twice daily 49 tablet 0   No facility-administered medications prior to visit.    PAST MEDICAL HISTORY: Past Medical History  Diagnosis Date  . PE (pulmonary embolism)   . Subarachnoid hemorrhage ([place])   . Spastic hemiplegia affecting nondominant side ([place])   . Intracerebral hemorrhage ([place])   . Hypertension   . Arthritis   . Stroke ([place]) 3/12  . Gastrointestinal problem   . Depression   . Breast CA ([place])     mastectomy  . Colon cancer ([place])   . Tremor     benign essential  . Rheumatoid arthritis(714.0)   . Hypothyroidism   . Dysrhythmia     palpitation  . Lymphedema of arm  right  . Atrophic vaginitis   . Osteoporosis   . Multinodular goiter   . [place]ieres disease 2002  . Thrombocytopenia ([place]) 2002  . Gastroparesis 7/03  . Colon cancer ([place]) 2007  . Pulmonary embolism ([place]) 2003    chronic coumadin  . CVA (cerebral vascular accident) ([place])   . Lymphedema of upper extremity 12/14/2014    PAST SURGICAL HISTORY: Past Surgical History  Procedure Laterality Date  . Fracture surgery  2010    rt wrist  . Mastectomy      bilat mastectomies  . Breast surgery    . Joint replacement      Bilateral total hip replacement  . Hardware removal  03/29/2012    Procedure: HARDWARE REMOVAL;  Surgeon: Alta Corning, MD;  Location: [place];  Service: Orthopedics;  Laterality: Left;  Screw Removal Left Foot  . Shoulder arthroscopy Right   . Thyroidectomy    . Total hip arthroplasty    . Cholecystectomy    . Tonsillectomy    . Left ankle      ruptured achilles tendon  . Carpal tunnel release Bilateral   . Thoracic outlet surgery Bilateral   . Ulnar nerve transposition Right   . Knee surgery Bilateral   . Lumbar laminectomy    . Nasal sinus surgery    . Cervical laminectomy    . Bunionectomy Right   . Right wrist    . Squamous cell carcinoma resec      FAMILY HISTORY: Family History  Problem Relation Age of  Onset  . Cancer Father   . Heart failure Sister   . Tremor Maternal Grandmother   . Tremor Maternal Grandfather   . Tremor Paternal Grandmother   . Tremor Paternal Grandfather     SOCIAL HISTORY: Social History   Social History  . Marital Status: Married    Spouse Name: N/A  . Number of Children: 4  . Years of Education: college   Occupational History  . Retired    Social History Main Topics  . Smoking status: Former Smoker    Quit date: 02/25/1970  . Smokeless tobacco: Never Used  . Alcohol Use: No     Comment: occ wine  . Drug Use: No  . Sexual Activity: No   Other Topics Concern  . Not on file   Social History Narrative   Patient does not drink caffeine.   Patient is right handed.    Resides at [place].       PHYSICAL EXAM  Filed Vitals:   09/20/15 1050  BP: 103/52  Pulse: 69  Height: 5\' 8"  (1.727 m)   There is no weight on file to calculate BMI. General: The patient is alert and cooperative at the time of the examination. Skin: No significant peripheral edema is noted. Compressions stockings bilaterally   Neurologic Exam  Mental status: The patient is alert and oriented to person,  Not date year etc. Cranial nerves: Facial symmetry is present. Speech is normal, no aphasia or dysarthria is noted. Extraocular movements are full. Visual fields are full. Motor: The patient has fair strength in all 4 extremities except poor grip left hand and mild weakness left arm.She also has weakness in r hand. Right foot drop noted . She has poor effort on exam Sensory examination: Soft touch sensation is symmetric on the face, arms, and legs to touch and pinprick. Coordination: The patient has good finger-nose-finger unable to perform heel-to-shin The patient has some tremors with  the left greater than right upper extremity. Gait and station: Requires maximal assistance with trying to stand. She is wheelchair-bound. Reflexes: Deep tendon reflexes are symmetric  upper and lower.  DIAGNOSTIC DATA (LABS, IMAGING, TESTING) - I reviewed patient records, labs, notes, testing and imaging myself where available.  Lab Results  Component Value Date   WBC 5.6 12/14/2014   HGB 13.1 12/14/2014   HCT 39.3 12/14/2014   MCV 99.4 12/14/2014   PLT 130* 12/14/2014      Component Value Date/Time   NA 146* 12/14/2014 1431   NA 143 06/09/2014 0456   NA 139 10/15/2012 1308   K 4.3 12/14/2014 1431   K 3.7 06/09/2014 0456   CL 105 06/09/2014 0456   CL 103 10/29/2012 1414   CO2 28 12/14/2014 1431   CO2 26 08/25/2013 1047   GLUCOSE 118 12/14/2014 1431   GLUCOSE 114* 06/09/2014 0456   GLUCOSE 142* 10/29/2012 1414   GLUCOSE 129* 10/15/2012 1308   BUN 17.0 12/14/2014 1431   BUN 23 06/09/2014 0456   BUN 11 10/15/2012 1308   CREATININE 0.7 12/14/2014 1431   CREATININE 0.70 06/09/2014 0456   CALCIUM 8.6 12/14/2014 1431   CALCIUM 8.6 08/25/2013 1047   PROT 6.3* 12/14/2014 1431   PROT 6.2 10/15/2012 1308   PROT 5.5* 10/25/2011 1401   ALBUMIN 3.8 12/14/2014 1431   ALBUMIN 4.3 10/15/2012 1308   ALBUMIN 3.7 10/25/2011 1401   AST 18 12/14/2014 1431   AST 22 10/15/2012 1308   ALT 9 12/14/2014 1431   ALT 17 10/15/2012 1308   ALKPHOS 85 12/14/2014 1431   ALKPHOS 63 10/15/2012 1308   BILITOT 0.44 12/14/2014 1431   BILITOT 0.3 10/15/2012 1308   GFRNONAA 87* 08/25/2013 1047   GFRAA >90 08/25/2013 1047    ASSESSMENT AND PLAN 82 year old female with history of essential tremor, delusional thinking, gait disorder and memory disorder CT of the head12/1/16 was negative for acute intracranial traumatic injury. There is severe generalized atrophy, small vessel disease and remote right frontal infarction. Namenda was started as well as Abilify for her delusional thinking but  palliative care got involved and to simplify her medications they discontinued the Namenda after Abilify. She remains on Mysoline for tremor.    PLAN: Continue Mysoline for her essential tremor   Occupational therapy to work with her poor grip strength  She will follow-up in 6 months, next with Dr. Jerline Pain, [facility], [facility], [facility] [address], [place], [place] [address] [phone]

## 2015-09-20 NOTE — Progress Notes (Signed)
I have read the note, and I agree with the clinical assessment and plan.  Cortavius Montesinos KEITH   

## 2015-09-20 NOTE — Patient Instructions (Signed)
Per nursing home sheet 

## 2015-09-28 ENCOUNTER — Emergency Department (HOSPITAL_COMMUNITY): Payer: Medicare Other

## 2015-09-28 ENCOUNTER — Encounter (HOSPITAL_COMMUNITY): Payer: Self-pay

## 2015-09-28 ENCOUNTER — Inpatient Hospital Stay (HOSPITAL_COMMUNITY)
Admission: EM | Admit: 2015-09-28 | Discharge: 2015-09-30 | DRG: 872 | Disposition: A | Discharge status: Hospice | Payer: Medicare Other | Attending: Internal Medicine | Admitting: Internal Medicine

## 2015-09-28 DIAGNOSIS — M810 Age-related osteoporosis without current pathological fracture: Secondary | ICD-10-CM | POA: Diagnosis present

## 2015-09-28 DIAGNOSIS — Z886 Allergy status to analgesic agent status: Secondary | ICD-10-CM

## 2015-09-28 DIAGNOSIS — A4159 Other Gram-negative sepsis: Secondary | ICD-10-CM | POA: Diagnosis present

## 2015-09-28 DIAGNOSIS — B9620 Unspecified Escherichia coli [E. coli] as the cause of diseases classified elsewhere: Secondary | ICD-10-CM | POA: Diagnosis present

## 2015-09-28 DIAGNOSIS — Z885 Allergy status to narcotic agent status: Secondary | ICD-10-CM

## 2015-09-28 DIAGNOSIS — R1084 Generalized abdominal pain: Secondary | ICD-10-CM | POA: Diagnosis present

## 2015-09-28 DIAGNOSIS — Z96643 Presence of artificial hip joint, bilateral: Secondary | ICD-10-CM | POA: Diagnosis present

## 2015-09-28 DIAGNOSIS — R7881 Bacteremia: Secondary | ICD-10-CM | POA: Diagnosis present

## 2015-09-28 DIAGNOSIS — D696 Thrombocytopenia, unspecified: Secondary | ICD-10-CM | POA: Diagnosis present

## 2015-09-28 DIAGNOSIS — F32A Depression, unspecified: Secondary | ICD-10-CM | POA: Diagnosis present

## 2015-09-28 DIAGNOSIS — Z66 Do not resuscitate: Secondary | ICD-10-CM | POA: Diagnosis present

## 2015-09-28 DIAGNOSIS — Z87891 Personal history of nicotine dependence: Secondary | ICD-10-CM

## 2015-09-28 DIAGNOSIS — E039 Hypothyroidism, unspecified: Secondary | ICD-10-CM | POA: Diagnosis present

## 2015-09-28 DIAGNOSIS — Z86711 Personal history of pulmonary embolism: Secondary | ICD-10-CM

## 2015-09-28 DIAGNOSIS — F329 Major depressive disorder, single episode, unspecified: Secondary | ICD-10-CM | POA: Diagnosis present

## 2015-09-28 DIAGNOSIS — I10 Essential (primary) hypertension: Secondary | ICD-10-CM | POA: Diagnosis present

## 2015-09-28 DIAGNOSIS — A419 Sepsis, unspecified organism: Secondary | ICD-10-CM | POA: Diagnosis not present

## 2015-09-28 DIAGNOSIS — Z79899 Other long term (current) drug therapy: Secondary | ICD-10-CM

## 2015-09-28 DIAGNOSIS — F0390 Unspecified dementia without behavioral disturbance: Secondary | ICD-10-CM | POA: Diagnosis present

## 2015-09-28 DIAGNOSIS — Z515 Encounter for palliative care: Secondary | ICD-10-CM | POA: Diagnosis not present

## 2015-09-28 DIAGNOSIS — F419 Anxiety disorder, unspecified: Secondary | ICD-10-CM | POA: Diagnosis present

## 2015-09-28 DIAGNOSIS — Z7901 Long term (current) use of anticoagulants: Secondary | ICD-10-CM

## 2015-09-28 DIAGNOSIS — Z85038 Personal history of other malignant neoplasm of large intestine: Secondary | ICD-10-CM

## 2015-09-28 DIAGNOSIS — E890 Postprocedural hypothyroidism: Secondary | ICD-10-CM | POA: Diagnosis present

## 2015-09-28 DIAGNOSIS — Z7982 Long term (current) use of aspirin: Secondary | ICD-10-CM

## 2015-09-28 DIAGNOSIS — Z888 Allergy status to other drugs, medicaments and biological substances status: Secondary | ICD-10-CM

## 2015-09-28 DIAGNOSIS — M069 Rheumatoid arthritis, unspecified: Secondary | ICD-10-CM | POA: Diagnosis present

## 2015-09-28 DIAGNOSIS — N390 Urinary tract infection, site not specified: Secondary | ICD-10-CM | POA: Diagnosis present

## 2015-09-28 DIAGNOSIS — Z87440 Personal history of urinary (tract) infections: Secondary | ICD-10-CM

## 2015-09-28 LAB — COMPREHENSIVE METABOLIC PANEL
ALBUMIN: 3.5 g/dL (ref 3.5–5.0)
ALT: 26 U/L (ref 14–54)
ANION GAP: 13 (ref 5–15)
AST: 42 U/L — ABNORMAL HIGH (ref 15–41)
Alkaline Phosphatase: 144 U/L — ABNORMAL HIGH (ref 38–126)
BILIRUBIN TOTAL: 0.9 mg/dL (ref 0.3–1.2)
BUN: 19 mg/dL (ref 6–20)
CALCIUM: 8.9 mg/dL (ref 8.9–10.3)
CO2: 25 mmol/L (ref 22–32)
Chloride: 103 mmol/L (ref 101–111)
Creatinine, Ser: 0.95 mg/dL (ref 0.44–1.00)
GFR calc non Af Amer: 55 mL/min — ABNORMAL LOW (ref >60)
GLUCOSE: 140 mg/dL — AB (ref 65–99)
POTASSIUM: 4.3 mmol/L (ref 3.5–5.1)
SODIUM: 141 mmol/L (ref 135–145)
TOTAL PROTEIN: 6.8 g/dL (ref 6.5–8.1)

## 2015-09-28 LAB — CBC
HEMATOCRIT: 40.9 % (ref 36.0–46.0)
HEMOGLOBIN: 13.2 g/dL (ref 12.0–15.0)
MCH: 32 pg (ref 26.0–34.0)
MCHC: 32.3 g/dL (ref 30.0–36.0)
MCV: 99.3 fL (ref 78.0–100.0)
Platelets: 143 10*3/uL — ABNORMAL LOW (ref 150–400)
RBC: 4.12 MIL/uL (ref 3.87–5.11)
RDW: 14.5 % (ref 11.5–15.5)
WBC: 9.9 10*3/uL (ref 4.0–10.5)

## 2015-09-28 LAB — URINE MICROSCOPIC-ADD ON

## 2015-09-28 LAB — URINALYSIS, ROUTINE W REFLEX MICROSCOPIC
BILIRUBIN URINE: NEGATIVE
Glucose, UA: NEGATIVE mg/dL
Ketones, ur: NEGATIVE mg/dL
NITRITE: POSITIVE — AB
PH: 5.5 (ref 5.0–8.0)
Protein, ur: >300 mg/dL — AB
Specific Gravity, Urine: 1.017 (ref 1.005–1.030)

## 2015-09-28 LAB — I-STAT CG4 LACTIC ACID, ED
LACTIC ACID, VENOUS: 3.5 mmol/L — AB (ref 0.5–2.0)
Lactic Acid, Venous: 1.63 mmol/L (ref 0.5–2.0)

## 2015-09-28 MED ORDER — PIPERACILLIN-TAZOBACTAM 3.375 G IVPB
3.3750 g | Freq: Three times a day (TID) | INTRAVENOUS | Status: DC
  Filled 2015-09-28 (×2): qty 50

## 2015-09-28 MED ORDER — HYDROMORPHONE HCL 1 MG/ML IJ SOLN
0.5000 mg | Freq: Once | INTRAMUSCULAR | Status: AC
  Administered 2015-09-28: 0.5 mg via INTRAVENOUS
  Filled 2015-09-28: qty 1

## 2015-09-28 MED ORDER — ACETAMINOPHEN 325 MG PO TABS
650.0000 mg | ORAL_TABLET | Freq: Once | ORAL | Status: AC
  Administered 2015-09-28: 650 mg via ORAL
  Filled 2015-09-28: qty 2

## 2015-09-28 MED ORDER — VANCOMYCIN HCL IN DEXTROSE 1-5 GM/200ML-% IV SOLN
1000.0000 mg | Freq: Once | INTRAVENOUS | Status: AC
  Administered 2015-09-28: 1000 mg via INTRAVENOUS
  Filled 2015-09-28: qty 200

## 2015-09-28 MED ORDER — PIPERACILLIN-TAZOBACTAM 3.375 G IVPB 30 MIN
3.3750 g | Freq: Once | INTRAVENOUS | Status: AC
  Administered 2015-09-28: 3.375 g via INTRAVENOUS
  Filled 2015-09-28: qty 50

## 2015-09-28 MED ORDER — VANCOMYCIN HCL 500 MG IV SOLR
500.0000 mg | Freq: Two times a day (BID) | INTRAVENOUS | Status: DC
  Filled 2015-09-28: qty 500

## 2015-09-28 MED ORDER — IOPAMIDOL (ISOVUE-300) INJECTION 61%
INTRAVENOUS | Status: AC
  Administered 2015-09-28: 100 mL
  Filled 2015-09-28: qty 100

## 2015-09-28 MED ORDER — SODIUM CHLORIDE 0.9 % IV SOLN
1000.0000 mL | INTRAVENOUS | Status: DC
  Administered 2015-09-28: 1000 mL via INTRAVENOUS

## 2015-09-28 NOTE — ED Notes (Signed)
This RN with multiple attempts at IV access unsuccessful. Jon RN to gain access via ultrasound.

## 2015-09-28 NOTE — Progress Notes (Signed)
Pharmacy Antibiotic Note  [name] is a 82 y.o. female admitted on 09/28/2015 with sepsis.  Pharmacy has been consulted for vancomycin and zosyn dosing. Tmax is 102 and WBC is WNL. SCr is 0.95 and lactic acid is elevated at 3.5.   Plan: - Vanc 1gm IV x 1 then 500mg  IV Q12H - Zosyn 3.375gm IV Q8H (4 hr inf) - F/u renal fxn, C&S, clinical status and trough at SS  Height: 5\' 5"  (165.1 cm) Weight: 147 lb (66.679 kg) IBW/kg (Calculated) : 57  Temp (24hrs), Avg:102 F (38.9 C), Min:102 F (38.9 C), Max:102 F (38.9 C)   Recent Labs Lab 09/28/15 1953 09/28/15 2007  WBC 9.9  --   CREATININE 0.95  --   LATICACIDVEN  --  3.50*    Estimated Creatinine Clearance: 43.2 mL/min (by C-G formula based on Cr of 0.95).    Allergies  Allergen Reactions  . Demerol   . Fentanyl   . Metoclopramide Other (See Comments)    crazy  . Metoclopramide Hcl   . Morphine And Related   . Nsaids     Upset stomach  . Oxycodone-Acetaminophen     Other reaction(s): Hallucinations  . Pentazocine     Other reaction(s): Hallucinations  . Pentazocine Lactate   . Versed [Midazolam] Other (See Comments)    Antimicrobials this admission: Vanc 5/23>> Zosyn 5/23>>  Dose adjustments this admission: N/A  Microbiology results: Pending  Thank you for allowing pharmacy to be a part of this patient's care.  Lester Platas, Rande Lawman 09/28/2015 9:24 PM

## 2015-09-28 NOTE — ED Notes (Signed)
Per EMS, pt from [facility], staff reports fever x 3 days with increasing lethargy x 2 days. Pt had temp of 100 oral with facility. Pt warm to touch. Pt has no complaints. Facility reports that pt answers questions selectively. VS 104/45, HR 70, spo2 95% on RA, RR 16. No dementia noted in pt's paperwork. Hx of frequent UTI's but facility unable to provide recent information on patient's urinary status. Pt has hx of cva and has residual of left sided weakness. Pt leans to the right which facility states is normal. Facility also reports that systolic BP of 123XX123 is normal for patient.

## 2015-09-28 NOTE — ED Notes (Signed)
Pt back from x-ray.

## 2015-09-28 NOTE — ED Notes (Signed)
This RN spoke with Dr Winfred Leeds about possible bolus for patient and he stated that we are giving the pt 111ml/hr unless lactate is greater than 4.

## 2015-09-28 NOTE — ED Provider Notes (Signed)
CSN: [id number]     Arrival date & time 09/28/15  1836 History   First MD Initiated Contact with Patient 09/28/15 1852     Chief Complaint  Patient presents with  . Altered Mental Status    Level V caveat altered mental status. History is obtained from records coming from patient and from her son who accompanies her (Consider location/radiation/quality/duration/timing/severity/associated sxs/prior Treatment) HPI  Patient has had steady decline becoming more lethargic and "not making sense" over the past 2-3 days. She comes complained of lower abdominal pain. She presently complains of pain but is unable to elicit where. She denies other associated symptoms. No treatment prior to coming here Past Medical History  Diagnosis Date  . PE (pulmonary embolism)   . Subarachnoid hemorrhage ([place])   . Spastic hemiplegia affecting nondominant side ([place])   . Intracerebral hemorrhage ([place])   . Hypertension   . Arthritis   . Stroke ([place]) 3/12  . Gastrointestinal problem   . Depression   . Breast CA ([place])     mastectomy  . Colon cancer ([place])   . Tremor     benign essential  . Rheumatoid arthritis(714.0)   . Hypothyroidism   . Dysrhythmia     palpitation  . Lymphedema of arm     right  . Atrophic vaginitis   . Osteoporosis   . Multinodular goiter   . Menieres disease 2002  . Thrombocytopenia ([place]) 2002  . Gastroparesis 7/03  . Colon cancer ([place]) 2007  . Pulmonary embolism ([place]) 2003    chronic coumadin  . CVA (cerebral vascular accident) ([place])   . Lymphedema of upper extremity 12/14/2014   Past Surgical History  Procedure Laterality Date  . Fracture surgery  2010    rt wrist  . Mastectomy      bilat mastectomies  . Breast surgery    . Joint replacement      Bilateral total hip replacement  . Hardware removal  03/29/2012    Procedure: HARDWARE REMOVAL;  Surgeon: Alta Corning, MD;  Location: [facility];  Service: Orthopedics;  Laterality: Left;  Screw Removal Left  Foot  . Shoulder arthroscopy Right   . Thyroidectomy    . Total hip arthroplasty    . Cholecystectomy    . Tonsillectomy    . Left ankle      ruptured achilles tendon  . Carpal tunnel release Bilateral   . Thoracic outlet surgery Bilateral   . Ulnar nerve transposition Right   . Knee surgery Bilateral   . Lumbar laminectomy    . Nasal sinus surgery    . Cervical laminectomy    . Bunionectomy Right   . Right wrist    . Squamous cell carcinoma resec     Family History  Problem Relation Age of Onset  . Cancer Father   . Heart failure Sister   . Tremor Maternal Grandmother   . Tremor Maternal Grandfather   . Tremor Paternal Grandmother   . Tremor Paternal Grandfather    Social History  Substance Use Topics  . Smoking status: Former Smoker    Quit date: 02/25/1970  . Smokeless tobacco: Never Used  . Alcohol Use: No     Comment: occ wine   OB History    Gravida Para Term Preterm AB TAB SAB Ectopic Multiple Living   4 4   0     4     Review of Systems  Unable to perform ROS: Dementia  Allergies  Demerol; Fentanyl; Metoclopramide; Metoclopramide hcl; Morphine and related; Nsaids; Oxycodone-acetaminophen; Pentazocine; Pentazocine lactate; and Versed  Home Medications   Prior to Admission medications   Medication Sig Start Date End Date Taking? Authorizing Provider  albuterol (PROVENTIL HFA;VENTOLIN HFA) 108 (90 BASE) MCG/ACT inhaler Inhale 2 puffs into the lungs every 6 (six) hours as needed for wheezing or shortness of breath.    Historical Provider, MD  amoxicillin (AMOXIL) 500 MG capsule Take 2,000 mg by mouth See admin instructions. Reported on 09/20/2015 03/10/14   Historical Provider, MD  aspirin 81 MG tablet Take 81 mg by mouth daily.    Historical Provider, MD  Cholecalciferol (VITAMIN D) 2000 UNITS CAPS Take 1 capsule by mouth daily.    Historical Provider, MD  colestipol (COLESTID) 1 G tablet Take 1 g by mouth 2 (two) times daily.    Historical Provider,  MD  CRANBERRY PO Take 425 mg by mouth daily.    Historical Provider, MD  divalproex (DEPAKOTE SPRINKLE) 125 MG capsule Take by mouth 3 (three) times daily.    Historical Provider, MD  furosemide (LASIX) 20 MG tablet Take 10 mg by mouth daily.     Historical Provider, MD  hydroxychloroquine (PLAQUENIL) 200 MG tablet Take 200 mg by mouth 2 (two) times daily.     Historical Provider, MD  Infant Care Products [facility] EX) Apply topically daily as needed (for skin irratation).     Historical Provider, MD  LACTASE PO Take 2 tablets by mouth 3 (three) times daily with meals.     Historical Provider, MD  levothyroxine (SYNTHROID, LEVOTHROID) 200 MCG tablet Take 200 mcg by mouth daily before breakfast.     Historical Provider, MD  levothyroxine (SYNTHROID, LEVOTHROID) 25 MCG tablet Take 25 mcg by mouth daily before breakfast.    Historical Provider, MD  LORazepam (ATIVAN) 0.5 MG tablet Take 1 tablet (0.5 mg total) by mouth 2 (two) times daily. Hold for sedation 08/26/13   Delfina Redwood, MD  LORazepam (ATIVAN) 0.5 MG tablet Take 0.25 mg by mouth daily as needed for anxiety.    Historical Provider, MD  MINERAL OIL PO Place 2 drops into both ears once a week.     Historical Provider, MD  nystatin ointment (MYCOSTATIN) Apply 1 application topically as needed.    Historical Provider, MD  Petrolatum ([place]) OINT Apply topically as needed (for skin irritation).    Historical Provider, MD  potassium chloride (K-DUR) 10 MEQ tablet Take 10 mEq by mouth every other day.     Historical Provider, MD  predniSONE (DELTASONE) 5 MG tablet Take 5 mg by mouth daily with breakfast.  03/25/13   Historical Provider, MD  primidone (MYSOLINE) 250 MG tablet Take 0.5 tablets (125 mg total) by mouth 2 (two) times daily. Take with 1 tablet of Primidone 50mg  07/23/14   Kathrynn Ducking, MD  primidone (MYSOLINE) 50 MG tablet Take 1 tablet (50 mg total) by mouth 2 (two) times daily. Take with 1/2 tablet Primidone 250 mg  twice daily 07/18/14   Kathrynn Ducking, MD  sertraline (ZOLOFT) 100 MG tablet Take 100 mg by mouth daily.     Historical Provider, MD  spiritus frumenti (ETHYL ALCOHOL) SOLN Take 1 each by mouth every evening. Wine (3 ounces)    Historical Provider, MD  UNABLE TO FIND Med Name: Tussin 4 tsp po tid prn    Historical Provider, MD   BP 119/55 mmHg  Pulse 93  Temp(Src) 102 F (38.9 C) (  Rectal)  Resp 19  Ht 5\' 5"  (1.651 m)  Wt 147 lb (66.679 kg)  BMI 24.46 kg/m2  SpO2 100%  LMP 06/08/1986 Physical Exam  Constitutional: She appears distressed.  Chronically ill-appearing  HENT:  Head: Normocephalic and atraumatic.  Eyes: Conjunctivae are normal. Pupils are equal, round, and reactive to light.  Neck: Neck supple. No tracheal deviation present. No thyromegaly present.  Cardiovascular: Normal rate and regular rhythm.   No murmur heard. Pulmonary/Chest: Effort normal and breath sounds normal.  Abdominal: Soft. Bowel sounds are normal. She exhibits no distension. There is no tenderness.  Tender at suprapubic area  Genitourinary: Vagina normal.  Normal external female genitalia  Musculoskeletal: Normal range of motion. She exhibits no edema or tenderness.  Neurological: She is alert. Coordination normal.  Does not follow simple commands, moves all extremities  Skin: Skin is warm and dry. No rash noted.  Nursing note and vitals reviewed.  12:05 AM patient resting comfortably after treatment with intravenous hydromorphone and intravenous antibiotics.  ED Course  Procedures (including critical care time) Labs Review Labs Reviewed  COMPREHENSIVE METABOLIC PANEL  CBC  URINALYSIS, ROUTINE W REFLEX MICROSCOPIC (NOT AT [facility])    Imaging Review No results found. I have personally reviewed and evaluated these images and lab results as part of my medical decision-making.   EKG Interpretation None     Results for orders placed or performed during the hospital encounter of 09/28/15   Comprehensive metabolic panel  Result Value Ref Range   Sodium 141 135 - 145 mmol/L   Potassium 4.3 3.5 - 5.1 mmol/L   Chloride 103 101 - 111 mmol/L   CO2 25 22 - 32 mmol/L   Glucose, Bld 140 (H) 65 - 99 mg/dL   BUN 19 6 - 20 mg/dL   Creatinine, Ser 0.95 0.44 - 1.00 mg/dL   Calcium 8.9 8.9 - 10.3 mg/dL   Total Protein 6.8 6.5 - 8.1 g/dL   Albumin 3.5 3.5 - 5.0 g/dL   AST 42 (H) 15 - 41 U/L   ALT 26 14 - 54 U/L   Alkaline Phosphatase 144 (H) 38 - 126 U/L   Total Bilirubin 0.9 0.3 - 1.2 mg/dL   GFR calc non Af Amer 55 (L) >60 mL/min   GFR calc Af Amer >60 >60 mL/min   Anion gap 13 5 - 15  CBC  Result Value Ref Range   WBC 9.9 4.0 - 10.5 K/uL   RBC 4.12 3.87 - 5.11 MIL/uL   Hemoglobin 13.2 12.0 - 15.0 g/dL   HCT 40.9 36.0 - 46.0 %   MCV 99.3 78.0 - 100.0 fL   MCH 32.0 26.0 - 34.0 pg   MCHC 32.3 30.0 - 36.0 g/dL   RDW 14.5 11.5 - 15.5 %   Platelets 143 (L) 150 - 400 K/uL  Urinalysis, Routine w reflex microscopic (not at [facility])  Result Value Ref Range   Color, Urine YELLOW YELLOW   APPearance TURBID (A) CLEAR   Specific Gravity, Urine 1.017 1.005 - 1.030   pH 5.5 5.0 - 8.0   Glucose, UA NEGATIVE NEGATIVE mg/dL   Hgb urine dipstick MODERATE (A) NEGATIVE   Bilirubin Urine NEGATIVE NEGATIVE   Ketones, ur NEGATIVE NEGATIVE mg/dL   Protein, ur >300 (A) NEGATIVE mg/dL   Nitrite POSITIVE (A) NEGATIVE   Leukocytes, UA MODERATE (A) NEGATIVE  Urine microscopic-add on  Result Value Ref Range   Squamous Epithelial / LPF 0-5 (A) NONE SEEN   WBC, UA 6-30  0 - 5 WBC/hpf   RBC / HPF 0-5 0 - 5 RBC/hpf   Bacteria, UA MANY (A) NONE SEEN  I-Stat CG4 Lactic Acid, ED  (not at  [facility])  Result Value Ref Range   Lactic Acid, Venous 3.50 (HH) 0.5 - 2.0 mmol/L   Comment NOTIFIED PHYSICIAN   I-Stat CG4 Lactic Acid, ED  (not at  [facility])  Result Value Ref Range   Lactic Acid, Venous 1.63 0.5 - 2.0 mmol/L   Dg Chest 1 View  09/28/2015  CLINICAL DATA:  Acute onset of fever and lethargy. Altered  mental status. Initial encounter. EXAM: CHEST 1 VIEW COMPARISON:  Chest radiograph from 04/15/2015 FINDINGS: The lungs are well-aerated. Vascular congestion is noted. Patchy bilateral airspace opacities raise concern for pulmonary edema or pneumonia. A small left pleural effusion is noted. No pneumothorax is seen. The cardiomediastinal silhouette is mildly enlarged. No acute osseous abnormalities are seen. Postoperative change is noted about the thyroid bed and at the right axilla. IMPRESSION: Vascular congestion and mild cardiomegaly. Patchy bilateral airspace opacities raise concern for pulmonary edema or pneumonia. Small left pleural effusion noted. Electronically Signed   By: Garald Balding M.D.   On: 09/28/2015 22:37   Ct Abdomen Pelvis W Contrast  09/28/2015  CLINICAL DATA:  Sepsis and abdominal pain.  Frequent UTIs. EXAM: CT ABDOMEN AND PELVIS WITH CONTRAST TECHNIQUE: Multidetector CT imaging of the abdomen and pelvis was performed using the standard protocol following bolus administration of intravenous contrast. CONTRAST:  150mL ISOVUE-300 IOPAMIDOL (ISOVUE-300) INJECTION 61% COMPARISON:  Pelvic CT 05/28/2008 and CT abdomen/ pelvis 02/11/2008 FINDINGS: Lung bases demonstrate bibasilar dependent atelectasis and small amount of bilateral pleural fluid. Moderate cardiomegaly with calcification of the mitral valve. Abdominal images demonstrate evidence of previous cholecystectomy. Persistent moderate dilatation of the common bile duct measuring 1.8 cm with mild prominence of the central intrahepatic ducts without significant change. There is fatty atrophy of the pancreas. The spleen, liver and adrenal glands within normal. Kidneys are normal in size without hydronephrosis or nephrolithiasis. There are several left renal cysts as well as several right renal cortical hypodensities which are too small characterize but likely cysts. Mild prominence of the left ureter with mucosal enhancement. Subtle middle  enhancement of the right ureter which is not dilated. Distal ureters are obscured by moderate streak artifact from bilateral hip prostheses. Mild calcified plaque involving the abdominal aorta. Stomach is within normal. There are multiple surgical clips in right mid to lower abdomen. There is a surgical suture line over the right colon and possible small bowel loop in the right abdomen. Mild diverticulosis of the colon. There is mild wall thickening of the distal descending and sigmoid colon. Uterus and adnexa are unremarkable. No definite dilated small bowel loops. Pelvic images demonstrate mild enhancement and thickening of the bladder wall. Much of the bladder is obscured due to the streak artifact from the bilateral hip prostheses. There is a lytic process with soft tissue density occupying the right sacrum measuring 2.5 x 4 cm. This likely represents a metastatic lesion in this patient with known history breast and colon cancer. Postsurgical changes of the spine. IMPRESSION: Mild wall thickening of the distal descending and sigmoid colon which may be due to a mild acute colitis. Mild enhancement and wall thickening of the bladder which may be due to cystitis. Lytic process with soft tissue mass involving the right inferior sacrum measuring 2.5 x 4 cm likely a metastatic lesion. Mild bibasilar atelectasis with small bilateral pleural effusions.  Moderate cardiomegaly. Stable post cholecystectomy prominence of the common bile duct measuring 1.8 cm. Bilateral renal cysts. Diverticulosis of the colon. Electronically Signed   By: Marin Olp M.D.   On: 09/28/2015 23:56    MDM  Code sepsis called based on surgical criteria of fever and heart rate. Source of infection unknown. I spoke with patient's son who is her healthcare power of attorney. She is DO NOT RESUSCITATE CODE STATUS. Palliative care consultation ordered to discuss goals of treatment Final diagnoses:  None  Hospitalist physician Dr. Donell Beers  for inpatient stay Plan admit to medical surgical floor Diagnoses #1sepsis #2 urinary tract infection #3hyperglycemia #4 sacral lesion     Orlie Dakin, MD 09/29/15 [id number]

## 2015-09-29 ENCOUNTER — Encounter (HOSPITAL_COMMUNITY): Payer: Self-pay | Admitting: Family Medicine

## 2015-09-29 DIAGNOSIS — I10 Essential (primary) hypertension: Secondary | ICD-10-CM

## 2015-09-29 DIAGNOSIS — N390 Urinary tract infection, site not specified: Secondary | ICD-10-CM | POA: Diagnosis present

## 2015-09-29 DIAGNOSIS — Z79899 Other long term (current) drug therapy: Secondary | ICD-10-CM | POA: Diagnosis not present

## 2015-09-29 DIAGNOSIS — Z515 Encounter for palliative care: Secondary | ICD-10-CM | POA: Diagnosis present

## 2015-09-29 DIAGNOSIS — Z7901 Long term (current) use of anticoagulants: Secondary | ICD-10-CM | POA: Diagnosis not present

## 2015-09-29 DIAGNOSIS — Z96643 Presence of artificial hip joint, bilateral: Secondary | ICD-10-CM | POA: Diagnosis present

## 2015-09-29 DIAGNOSIS — Z7982 Long term (current) use of aspirin: Secondary | ICD-10-CM | POA: Diagnosis not present

## 2015-09-29 DIAGNOSIS — D696 Thrombocytopenia, unspecified: Secondary | ICD-10-CM | POA: Diagnosis present

## 2015-09-29 DIAGNOSIS — E890 Postprocedural hypothyroidism: Secondary | ICD-10-CM

## 2015-09-29 DIAGNOSIS — F329 Major depressive disorder, single episode, unspecified: Secondary | ICD-10-CM | POA: Diagnosis present

## 2015-09-29 DIAGNOSIS — Z87440 Personal history of urinary (tract) infections: Secondary | ICD-10-CM | POA: Diagnosis not present

## 2015-09-29 DIAGNOSIS — F419 Anxiety disorder, unspecified: Secondary | ICD-10-CM

## 2015-09-29 DIAGNOSIS — A4151 Sepsis due to Escherichia coli [E. coli]: Secondary | ICD-10-CM | POA: Diagnosis not present

## 2015-09-29 DIAGNOSIS — Z87891 Personal history of nicotine dependence: Secondary | ICD-10-CM | POA: Diagnosis not present

## 2015-09-29 DIAGNOSIS — Z66 Do not resuscitate: Secondary | ICD-10-CM | POA: Diagnosis present

## 2015-09-29 DIAGNOSIS — Z885 Allergy status to narcotic agent status: Secondary | ICD-10-CM | POA: Diagnosis not present

## 2015-09-29 DIAGNOSIS — R7881 Bacteremia: Secondary | ICD-10-CM | POA: Diagnosis not present

## 2015-09-29 DIAGNOSIS — M810 Age-related osteoporosis without current pathological fracture: Secondary | ICD-10-CM | POA: Diagnosis present

## 2015-09-29 DIAGNOSIS — M069 Rheumatoid arthritis, unspecified: Secondary | ICD-10-CM | POA: Diagnosis present

## 2015-09-29 DIAGNOSIS — Z85038 Personal history of other malignant neoplasm of large intestine: Secondary | ICD-10-CM | POA: Diagnosis not present

## 2015-09-29 DIAGNOSIS — Z888 Allergy status to other drugs, medicaments and biological substances status: Secondary | ICD-10-CM | POA: Diagnosis not present

## 2015-09-29 DIAGNOSIS — B9620 Unspecified Escherichia coli [E. coli] as the cause of diseases classified elsewhere: Secondary | ICD-10-CM | POA: Diagnosis present

## 2015-09-29 DIAGNOSIS — F0390 Unspecified dementia without behavioral disturbance: Secondary | ICD-10-CM | POA: Diagnosis not present

## 2015-09-29 DIAGNOSIS — Z86711 Personal history of pulmonary embolism: Secondary | ICD-10-CM | POA: Diagnosis not present

## 2015-09-29 DIAGNOSIS — R319 Hematuria, unspecified: Secondary | ICD-10-CM

## 2015-09-29 DIAGNOSIS — A419 Sepsis, unspecified organism: Secondary | ICD-10-CM | POA: Diagnosis present

## 2015-09-29 DIAGNOSIS — R1084 Generalized abdominal pain: Secondary | ICD-10-CM | POA: Diagnosis present

## 2015-09-29 DIAGNOSIS — A4159 Other Gram-negative sepsis: Secondary | ICD-10-CM | POA: Diagnosis present

## 2015-09-29 DIAGNOSIS — Z886 Allergy status to analgesic agent status: Secondary | ICD-10-CM | POA: Diagnosis not present

## 2015-09-29 LAB — BASIC METABOLIC PANEL
ANION GAP: 13 (ref 5–15)
BUN: 23 mg/dL — ABNORMAL HIGH (ref 6–20)
CHLORIDE: 107 mmol/L (ref 101–111)
CO2: 24 mmol/L (ref 22–32)
CREATININE: 0.92 mg/dL (ref 0.44–1.00)
Calcium: 8.8 mg/dL — ABNORMAL LOW (ref 8.9–10.3)
GFR calc non Af Amer: 58 mL/min — ABNORMAL LOW (ref >60)
Glucose, Bld: 99 mg/dL (ref 65–99)
Potassium: 4 mmol/L (ref 3.5–5.1)
Sodium: 144 mmol/L (ref 135–145)

## 2015-09-29 LAB — BLOOD CULTURE ID PANEL (REFLEXED)
ACINETOBACTER BAUMANNII: NOT DETECTED
CANDIDA ALBICANS: NOT DETECTED
Candida glabrata: NOT DETECTED
Candida krusei: NOT DETECTED
Candida parapsilosis: NOT DETECTED
Candida tropicalis: NOT DETECTED
Carbapenem resistance: NOT DETECTED
ENTEROBACTER CLOACAE COMPLEX: NOT DETECTED
ENTEROBACTERIACEAE SPECIES: DETECTED — AB
ENTEROCOCCUS SPECIES: NOT DETECTED
Escherichia coli: DETECTED — AB
HAEMOPHILUS INFLUENZAE: NOT DETECTED
Klebsiella oxytoca: NOT DETECTED
Klebsiella pneumoniae: NOT DETECTED
Listeria monocytogenes: NOT DETECTED
METHICILLIN RESISTANCE: NOT DETECTED
NEISSERIA MENINGITIDIS: NOT DETECTED
PSEUDOMONAS AERUGINOSA: NOT DETECTED
Proteus species: NOT DETECTED
STAPHYLOCOCCUS AUREUS BCID: NOT DETECTED
STREPTOCOCCUS PNEUMONIAE: NOT DETECTED
STREPTOCOCCUS PYOGENES: NOT DETECTED
STREPTOCOCCUS SPECIES: NOT DETECTED
Serratia marcescens: NOT DETECTED
Staphylococcus species: NOT DETECTED
Streptococcus agalactiae: NOT DETECTED
VANCOMYCIN RESISTANCE: NOT DETECTED

## 2015-09-29 LAB — LACTIC ACID, PLASMA
LACTIC ACID, VENOUS: 1.3 mmol/L (ref 0.5–2.0)
LACTIC ACID, VENOUS: 1.1 mmol/L (ref 0.5–2.0)
Lactic Acid, Venous: 2.2 mmol/L (ref 0.5–2.0)

## 2015-09-29 LAB — GLUCOSE, CAPILLARY: Glucose-Capillary: 109 mg/dL — ABNORMAL HIGH (ref 65–99)

## 2015-09-29 LAB — PROTIME-INR
INR: 1.37 (ref 0.00–1.49)
PROTHROMBIN TIME: 17 s — AB (ref 11.6–15.2)

## 2015-09-29 LAB — APTT: aPTT: 36 seconds (ref 24–37)

## 2015-09-29 LAB — PROCALCITONIN: Procalcitonin: 5.35 ng/mL

## 2015-09-29 MED ORDER — VITAMIN D 1000 UNITS PO TABS
2000.0000 [IU] | ORAL_TABLET | Freq: Every day | ORAL | Status: DC
  Administered 2015-09-29: 2000 [IU] via ORAL
  Filled 2015-09-29: qty 2

## 2015-09-29 MED ORDER — ONDANSETRON HCL 4 MG/2ML IJ SOLN: 4.0000 mg | Freq: Four times a day (QID) | INTRAMUSCULAR | Status: DC | PRN

## 2015-09-29 MED ORDER — LEVOTHYROXINE SODIUM 100 MCG PO TABS: 200.0000 ug | ORAL_TABLET | Freq: Every day | ORAL | Status: DC

## 2015-09-29 MED ORDER — LORAZEPAM 0.5 MG PO TABS
0.2500 mg | ORAL_TABLET | Freq: Every day | ORAL | Status: DC | PRN
  Filled 2015-09-29: qty 1

## 2015-09-29 MED ORDER — ONDANSETRON HCL 4 MG PO TABS
4.0000 mg | ORAL_TABLET | Freq: Four times a day (QID) | ORAL | Status: DC | PRN
Start: 2015-09-29 — End: 2015-09-30

## 2015-09-29 MED ORDER — POLYETHYLENE GLYCOL 3350 17 G PO PACK: 17.0000 g | PACK | Freq: Every day | ORAL | Status: DC | PRN

## 2015-09-29 MED ORDER — HYDROCORTISONE NA SUCCINATE PF 100 MG IJ SOLR
100.0000 mg | Freq: Three times a day (TID) | INTRAMUSCULAR | Status: DC
  Administered 2015-09-29 – 2015-09-30 (×4): 100 mg via INTRAVENOUS
  Filled 2015-09-29 (×4): qty 2

## 2015-09-29 MED ORDER — LEVOTHYROXINE SODIUM 25 MCG PO TABS: 25.0000 ug | ORAL_TABLET | Freq: Every day | ORAL | Status: DC

## 2015-09-29 MED ORDER — SODIUM CHLORIDE 0.9 % IV SOLN: 250.0000 mL | INTRAVENOUS | Status: DC | PRN

## 2015-09-29 MED ORDER — LEVOTHYROXINE SODIUM 75 MCG PO TABS
225.0000 ug | ORAL_TABLET | Freq: Every day | ORAL | Status: DC
  Administered 2015-09-29 – 2015-09-30 (×2): 225 ug via ORAL
  Filled 2015-09-29 (×2): qty 3

## 2015-09-29 MED ORDER — SODIUM CHLORIDE 0.9 % IV BOLUS (SEPSIS)
500.0000 mL | Freq: Once | INTRAVENOUS | Status: AC
  Administered 2015-09-29: 500 mL via INTRAVENOUS

## 2015-09-29 MED ORDER — CETYLPYRIDINIUM CHLORIDE 0.05 % MT LIQD
7.0000 mL | Freq: Two times a day (BID) | OROMUCOSAL | Status: DC
  Administered 2015-09-29 – 2015-09-30 (×2): 7 mL via OROMUCOSAL

## 2015-09-29 MED ORDER — BISACODYL 10 MG RE SUPP: 10.0000 mg | Freq: Every day | RECTAL | Status: DC | PRN

## 2015-09-29 MED ORDER — ASPIRIN 81 MG PO CHEW
81.0000 mg | CHEWABLE_TABLET | Freq: Every day | ORAL | Status: DC
  Administered 2015-09-29: 81 mg via ORAL
  Filled 2015-09-29: qty 1

## 2015-09-29 MED ORDER — DEXTROSE 5 % IV SOLN
2.0000 g | Freq: Two times a day (BID) | INTRAVENOUS | Status: DC
  Administered 2015-09-29 – 2015-09-30 (×3): 2 g via INTRAVENOUS
  Filled 2015-09-29 (×4): qty 2

## 2015-09-29 MED ORDER — PREDNISONE 5 MG PO TABS
5.0000 mg | ORAL_TABLET | Freq: Every day | ORAL | Status: DC
  Filled 2015-09-29: qty 1

## 2015-09-29 MED ORDER — ENOXAPARIN SODIUM 40 MG/0.4ML ~~LOC~~ SOLN
40.0000 mg | SUBCUTANEOUS | Status: DC
Start: 2015-09-29 — End: 2015-09-30
  Administered 2015-09-29: 40 mg via SUBCUTANEOUS
  Filled 2015-09-29: qty 0.4

## 2015-09-29 MED ORDER — PRIMIDONE 50 MG PO TABS
175.0000 mg | ORAL_TABLET | Freq: Two times a day (BID) | ORAL | Status: DC
  Administered 2015-09-29 – 2015-09-30 (×4): 175 mg via ORAL
  Filled 2015-09-29 (×4): qty 1

## 2015-09-29 MED ORDER — DEXTROSE 5 % IV SOLN
2.0000 g | Freq: Once | INTRAVENOUS | Status: DC
  Filled 2015-09-29: qty 2

## 2015-09-29 MED ORDER — HYDROXYCHLOROQUINE SULFATE 200 MG PO TABS
200.0000 mg | ORAL_TABLET | Freq: Two times a day (BID) | ORAL | Status: DC
  Administered 2015-09-29 – 2015-09-30 (×4): 200 mg via ORAL
  Filled 2015-09-29 (×4): qty 1

## 2015-09-29 MED ORDER — SODIUM CHLORIDE 0.9% FLUSH: 3.0000 mL | INTRAVENOUS | Status: DC | PRN

## 2015-09-29 MED ORDER — LORAZEPAM 0.5 MG PO TABS
0.5000 mg | ORAL_TABLET | Freq: Two times a day (BID) | ORAL | Status: DC
  Administered 2015-09-29 – 2015-09-30 (×4): 0.5 mg via ORAL
  Filled 2015-09-29 (×4): qty 1

## 2015-09-29 MED ORDER — COLESTIPOL HCL 1 G PO TABS
1.0000 g | ORAL_TABLET | Freq: Two times a day (BID) | ORAL | Status: DC
  Administered 2015-09-29 – 2015-09-30 (×2): 1 g via ORAL
  Filled 2015-09-29 (×3): qty 1

## 2015-09-29 MED ORDER — HYDROMORPHONE HCL 1 MG/ML IJ SOLN
1.0000 mg | INTRAMUSCULAR | Status: DC | PRN
  Administered 2015-09-29: 1 mg via INTRAVENOUS
  Filled 2015-09-29: qty 1

## 2015-09-29 MED ORDER — ALBUTEROL SULFATE (2.5 MG/3ML) 0.083% IN NEBU: 3.0000 mL | INHALATION_SOLUTION | Freq: Four times a day (QID) | RESPIRATORY_TRACT | Status: DC | PRN

## 2015-09-29 MED ORDER — SERTRALINE HCL 100 MG PO TABS
100.0000 mg | ORAL_TABLET | Freq: Every day | ORAL | Status: DC
  Administered 2015-09-29 – 2015-09-30 (×2): 100 mg via ORAL
  Filled 2015-09-29 (×2): qty 1

## 2015-09-29 MED ORDER — DIVALPROEX SODIUM 125 MG PO CSDR
125.0000 mg | DELAYED_RELEASE_CAPSULE | Freq: Three times a day (TID) | ORAL | Status: DC
  Administered 2015-09-29 (×2): 125 mg via ORAL
  Filled 2015-09-29 (×6): qty 1

## 2015-09-29 MED ORDER — SACCHAROMYCES BOULARDII 250 MG PO CAPS
250.0000 mg | ORAL_CAPSULE | Freq: Two times a day (BID) | ORAL | Status: DC
  Administered 2015-09-29 – 2015-09-30 (×4): 250 mg via ORAL
  Filled 2015-09-29 (×4): qty 1

## 2015-09-29 MED ORDER — SODIUM CHLORIDE 0.9 % IV SOLN
INTRAVENOUS | Status: AC
  Administered 2015-09-29: 03:00:00 via INTRAVENOUS

## 2015-09-29 MED ORDER — HYDROMORPHONE HCL 1 MG/ML IJ SOLN
0.5000 mg | INTRAMUSCULAR | Status: DC
  Administered 2015-09-29 – 2015-09-30 (×5): 0.5 mg via INTRAVENOUS
  Filled 2015-09-29 (×6): qty 1

## 2015-09-29 MED ORDER — PRIMIDONE 250 MG PO TABS: 125.0000 mg | ORAL_TABLET | Freq: Two times a day (BID) | ORAL | Status: DC

## 2015-09-29 MED ORDER — SODIUM CHLORIDE 0.9% FLUSH
3.0000 mL | Freq: Two times a day (BID) | INTRAVENOUS | Status: DC
  Administered 2015-09-30: 3 mL via INTRAVENOUS

## 2015-09-29 MED ORDER — PRIMIDONE 50 MG PO TABS: 50.0000 mg | ORAL_TABLET | Freq: Two times a day (BID) | ORAL | Status: DC

## 2015-09-29 NOTE — Progress Notes (Signed)
PHARMACY - PHYSICIAN COMMUNICATION CRITICAL VALUE ALERT - BLOOD CULTURE IDENTIFICATION (BCID)  Results for orders placed or performed during the hospital encounter of 09/28/15  Blood Culture ID Panel (Reflexed) (Collected: 09/28/2015  8:57 PM)  Result Value Ref Range   Enterococcus species NOT DETECTED NOT DETECTED   Vancomycin resistance NOT DETECTED NOT DETECTED   Listeria monocytogenes NOT DETECTED NOT DETECTED   Staphylococcus species NOT DETECTED NOT DETECTED   Staphylococcus aureus NOT DETECTED NOT DETECTED   Methicillin resistance NOT DETECTED NOT DETECTED   Streptococcus species NOT DETECTED NOT DETECTED   Streptococcus agalactiae NOT DETECTED NOT DETECTED   Streptococcus pneumoniae NOT DETECTED NOT DETECTED   Streptococcus pyogenes NOT DETECTED NOT DETECTED   Acinetobacter baumannii NOT DETECTED NOT DETECTED   Enterobacteriaceae species DETECTED (A) NOT DETECTED   Enterobacter cloacae complex NOT DETECTED NOT DETECTED   Escherichia coli DETECTED (A) NOT DETECTED   Klebsiella oxytoca NOT DETECTED NOT DETECTED   Klebsiella pneumoniae NOT DETECTED NOT DETECTED   Proteus species NOT DETECTED NOT DETECTED   Serratia marcescens NOT DETECTED NOT DETECTED   Carbapenem resistance NOT DETECTED NOT DETECTED   Haemophilus influenzae NOT DETECTED NOT DETECTED   Neisseria meningitidis NOT DETECTED NOT DETECTED   Pseudomonas aeruginosa NOT DETECTED NOT DETECTED   Candida albicans NOT DETECTED NOT DETECTED   Candida glabrata NOT DETECTED NOT DETECTED   Candida krusei NOT DETECTED NOT DETECTED   Candida parapsilosis NOT DETECTED NOT DETECTED   Candida tropicalis NOT DETECTED NOT DETECTED    Name of physician (or Provider) Contacted: Dr. Posey Pronto  Changes to prescribed antibiotics required: No change in antibiotics.  Patient on cefepime and MD would like to wait until sensitivities are back to narrow.  Enda Santo L. Nicole Kindred, PharmD PGY2 Infectious Diseases Pharmacy Resident Pager:  [phone] 09/29/2015 10:03 AM

## 2015-09-29 NOTE — H&P (Signed)
History and Physical    [name] [id number] DOB: [birth year] DOA: 09/28/2015  PCP: [facility]   Patient coming from: ALF  Chief Complaint: Fevers, lethargy  HPI: [name] is a 82 y.o. female with medical history significant for hypertension, hypothyroidism, rheumatoid arthritis, depression, anxiety, and dementia  Who presents from her assisted living facility for evaluation of fevers and lethargy of 3 days' duration. There had been no cough noted at her facility and the patient had not made any specific complaints, but was noted to be afebrile for the past 3 days and with worsening lethargy. The patient's son at bedside confirms that she has baseline dementia, but there was some increased confusion associated with this illness. She has history of urinary tract infections in the past with cultures having grown pan-sensitive Escherichia coli.  ED Course: Upon arrival to the ED, patient is found to be febrile to 38.9 C, saturating adequately on room air, tachycardic low 100s, and with blood pressures in the 100/55 range. Chemistry panel is largely unremarkable and CBC is notable for a platelet count of 143,000. Lactic acid is elevated to value 3.50  And urinalysis features many bacteria, moderate leukocyte, positive nitrite, and 6-30 WBC. Urine and blood cultures were obtained,, symptomatic care provided with Dilaudid, and normal saline was run at 125 cc/hr. In the antibiotics were initiated with vancomycin and Zosyn. Tachycardia resolved with the IV fluids, patient remained hemodynamically stable, and will be admitted to the hospital for ongoing evaluation and management of sepsis suspected secondary to UTI.  Review of Systems:  All other systems reviewed and apart from HPI, are negative.  Past Medical History  Diagnosis Date  . PE (pulmonary embolism)   . Subarachnoid hemorrhage ([place])   . Spastic hemiplegia affecting nondominant side ([place])   . Intracerebral  hemorrhage ([place])   . Hypertension   . Arthritis   . Stroke ([place]) 3/12  . Gastrointestinal problem   . Depression   . Breast CA ([place])     mastectomy  . Colon cancer ([place])   . Tremor     benign essential  . Rheumatoid arthritis(714.0)   . Hypothyroidism   . Dysrhythmia     palpitation  . Lymphedema of arm     right  . Atrophic vaginitis   . Osteoporosis   . Multinodular goiter   . [place] disease 2002  . Thrombocytopenia ([place]) 2002  . Gastroparesis 7/03  . Colon cancer ([place]) 2007  . Pulmonary embolism ([place]) 2003    chronic coumadin  . CVA (cerebral vascular accident) ([place])   . Lymphedema of upper extremity 12/14/2014    Past Surgical History  Procedure Laterality Date  . Fracture surgery  2010    rt wrist  . Mastectomy      bilat mastectomies  . Breast surgery    . Joint replacement      Bilateral total hip replacement  . Hardware removal  03/29/2012    Procedure: HARDWARE REMOVAL;  Surgeon: Alta Corning, MD;  Location: [facility];  Service: Orthopedics;  Laterality: Left;  Screw Removal Left Foot  . Shoulder arthroscopy Right   . Thyroidectomy    . Total hip arthroplasty    . Cholecystectomy    . Tonsillectomy    . Left ankle      ruptured achilles tendon  . Carpal tunnel release Bilateral   . Thoracic outlet surgery Bilateral   . Ulnar nerve transposition Right   . Knee  surgery Bilateral   . Lumbar laminectomy    . Nasal sinus surgery    . Cervical laminectomy    . Bunionectomy Right   . Right wrist    . Squamous cell carcinoma resec       reports that she quit smoking about 45 years ago. She has never used smokeless tobacco. She reports that she does not drink alcohol or use illicit drugs.  Allergies  Allergen Reactions  . Demerol   . Fentanyl   . Metoclopramide Other (See Comments)    crazy  . Metoclopramide Hcl   . Morphine And Related   . Nsaids     Upset stomach  . Oxycodone-Acetaminophen     Other reaction(s):  Hallucinations  . Pentazocine     Other reaction(s): Hallucinations  . Pentazocine Lactate   . Versed [Midazolam] Other (See Comments)    Family History  Problem Relation Age of Onset  . Cancer Father   . Heart failure Sister   . Tremor Maternal Grandmother   . Tremor Maternal Grandfather   . Tremor Paternal Grandmother   . Tremor Paternal Grandfather      Prior to Admission medications   Medication Sig Start Date End Date Taking? Authorizing Provider  albuterol (PROVENTIL HFA;VENTOLIN HFA) 108 (90 BASE) MCG/ACT inhaler Inhale 2 puffs into the lungs every 6 (six) hours as needed for wheezing or shortness of breath.    Historical Provider, MD  amoxicillin (AMOXIL) 500 MG capsule Take 2,000 mg by mouth See admin instructions. Reported on 09/20/2015 03/10/14   Historical Provider, MD  aspirin 81 MG tablet Take 81 mg by mouth daily.    Historical Provider, MD  Cholecalciferol (VITAMIN D) 2000 UNITS CAPS Take 1 capsule by mouth daily.    Historical Provider, MD  colestipol (COLESTID) 1 G tablet Take 1 g by mouth 2 (two) times daily.    Historical Provider, MD  CRANBERRY PO Take 425 mg by mouth daily.    Historical Provider, MD  divalproex (DEPAKOTE SPRINKLE) 125 MG capsule Take by mouth 3 (three) times daily.    Historical Provider, MD  furosemide (LASIX) 20 MG tablet Take 10 mg by mouth daily.     Historical Provider, MD  hydroxychloroquine (PLAQUENIL) 200 MG tablet Take 200 mg by mouth 2 (two) times daily.     Historical Provider, MD  Infant Care [employer] [facility] EX) Apply topically daily as needed (for skin irratation).     Historical Provider, MD  LACTASE PO Take 2 tablets by mouth 3 (three) times daily with meals.     Historical Provider, MD  levothyroxine (SYNTHROID, LEVOTHROID) 200 MCG tablet Take 200 mcg by mouth daily before breakfast.     Historical Provider, MD  levothyroxine (SYNTHROID, LEVOTHROID) 25 MCG tablet Take 25 mcg by mouth daily before breakfast.    Historical  Provider, MD  LORazepam (ATIVAN) 0.5 MG tablet Take 1 tablet (0.5 mg total) by mouth 2 (two) times daily. Hold for sedation 08/26/13   Delfina Redwood, MD  LORazepam (ATIVAN) 0.5 MG tablet Take 0.25 mg by mouth daily as needed for anxiety.    Historical Provider, MD  MINERAL OIL PO Place 2 drops into both ears once a week.     Historical Provider, MD  nystatin ointment (MYCOSTATIN) Apply 1 application topically as needed.    Historical Provider, MD  Petrolatum ([place]) OINT Apply topically as needed (for skin irritation).    Historical Provider, MD  potassium chloride (K-DUR) 10 MEQ tablet  Take 10 mEq by mouth every other day.     Historical Provider, MD  predniSONE (DELTASONE) 5 MG tablet Take 5 mg by mouth daily with breakfast.  03/25/13   Historical Provider, MD  primidone (MYSOLINE) 250 MG tablet Take 0.5 tablets (125 mg total) by mouth 2 (two) times daily. Take with 1 tablet of Primidone 50mg  07/23/14   Kathrynn Ducking, MD  primidone (MYSOLINE) 50 MG tablet Take 1 tablet (50 mg total) by mouth 2 (two) times daily. Take with 1/2 tablet Primidone 250 mg twice daily 07/18/14   Kathrynn Ducking, MD  sertraline (ZOLOFT) 100 MG tablet Take 100 mg by mouth daily.     Historical Provider, MD  spiritus frumenti (ETHYL ALCOHOL) SOLN Take 1 each by mouth every evening. Wine (3 ounces)    Historical Provider, MD  UNABLE TO FIND Med Name: Tussin 4 tsp po tid prn    Historical Provider, MD    Physical Exam: Filed Vitals:   09/28/15 2145 09/28/15 2250 09/28/15 2300 09/29/15 0129  BP: 103/56  99/53 92/47  Pulse: 102  100 94  Temp:  98 F (36.7 C)  99 F (37.2 C)  TempSrc:  Oral  Oral  Resp: 18  15 16   Height:      Weight:      SpO2: 92%  91% 93%      Constitutional: NAD, calm, comfortable Eyes: PERTLA, lids and conjunctivae normal ENMT: Mucous membranes are moist. Posterior pharynx clear of any exudate or lesions.   Neck: normal, supple, no masses, no thyromegaly Respiratory:  clear to auscultation bilaterally, no wheezing, no crackles. Normal respiratory effort.   Cardiovascular: S1 & S2 heard, regular rate and rhythm, grade 4 systolic murmur focused at apex. 2+ pedal pulses. No significant JVD. Abdomen: No distension, suprapubic tenderness, no rebound pain or guarding. Bowel sounds normal.  Musculoskeletal: no clubbing / cyanosis. No joint deformity upper and lower extremities. Normal muscle tone.  Skin: no significant rashes, lesions, ulcers. Warm, dry, well-perfused. Neurologic: Lethargic, aroused with difficulty. Disoriented. CN 2-12 grossly intact. Sensation intact, DTR normal. Strength 5/5 in all 4 limbs.  Psychiatric: Difficult to assess given the clinical scenario.     Labs on Admission: I have personally reviewed following labs and imaging studies  CBC:  Recent Labs Lab 09/28/15 1953  WBC 9.9  HGB 13.2  HCT 40.9  MCV 99.3  PLT [id number]*   Basic Metabolic Panel:  Recent Labs Lab 09/28/15 1953  NA 141  K 4.3  CL 103  CO2 25  GLUCOSE 140*  BUN 19  CREATININE 0.95  CALCIUM 8.9   GFR: Estimated Creatinine Clearance: 43.2 mL/min (by C-G formula based on Cr of 0.95). Liver Function Tests:  Recent Labs Lab 09/28/15 1953  AST 42*  ALT 26  ALKPHOS 144*  BILITOT 0.9  PROT 6.8  ALBUMIN 3.5   No results for input(s): LIPASE, AMYLASE in the last 168 hours. No results for input(s): AMMONIA in the last 168 hours. Coagulation Profile: No results for input(s): INR, PROTIME in the last 168 hours. Cardiac Enzymes: No results for input(s): CKTOTAL, CKMB, CKMBINDEX, TROPONINI in the last 168 hours. BNP (last 3 results) No results for input(s): PROBNP in the last 8760 hours. HbA1C: No results for input(s): HGBA1C in the last 72 hours. CBG: No results for input(s): GLUCAP in the last 168 hours. Lipid Profile: No results for input(s): CHOL, HDL, LDLCALC, TRIG, CHOLHDL, LDLDIRECT in the last 72 hours. Thyroid Function Tests: No results  for  input(s): TSH, T4TOTAL, FREET4, T3FREE, THYROIDAB in the last 72 hours. Anemia Panel: No results for input(s): VITAMINB12, FOLATE, FERRITIN, TIBC, IRON, RETICCTPCT in the last 72 hours. Urine analysis:    Component Value Date/Time   COLORURINE YELLOW 09/28/2015 1939   APPEARANCEUR TURBID* 09/28/2015 1939   LABSPEC 1.017 09/28/2015 1939   PHURINE 5.5 09/28/2015 1939   GLUCOSEU NEGATIVE 09/28/2015 1939   HGBUR MODERATE* 09/28/2015 1939   BILIRUBINUR NEGATIVE 09/28/2015 1939   KETONESUR NEGATIVE 09/28/2015 1939   PROTEINUR >300* 09/28/2015 1939   UROBILINOGEN 0.2 08/17/2013 2115   NITRITE POSITIVE* 09/28/2015 1939   LEUKOCYTESUR MODERATE* 09/28/2015 1939   Sepsis Labs: @LABRCNTIP (procalcitonin:4,lacticidven:4) )No results found for this or any previous visit (from the past 240 hour(s)).   Radiological Exams on Admission: Dg Chest 1 View  09/28/2015  CLINICAL DATA:  Acute onset of fever and lethargy. Altered mental status. Initial encounter. EXAM: CHEST 1 VIEW COMPARISON:  Chest radiograph from 04/15/2015 FINDINGS: The lungs are well-aerated. Vascular congestion is noted. Patchy bilateral airspace opacities raise concern for pulmonary edema or pneumonia. A small left pleural effusion is noted. No pneumothorax is seen. The cardiomediastinal silhouette is mildly enlarged. No acute osseous abnormalities are seen. Postoperative change is noted about the thyroid bed and at the right axilla. IMPRESSION: Vascular congestion and mild cardiomegaly. Patchy bilateral airspace opacities raise concern for pulmonary edema or pneumonia. Small left pleural effusion noted. Electronically Signed   By: Garald Balding M.D.   On: 09/28/2015 22:37   Ct Abdomen Pelvis W Contrast  09/28/2015  CLINICAL DATA:  Sepsis and abdominal pain.  Frequent UTIs. EXAM: CT ABDOMEN AND PELVIS WITH CONTRAST TECHNIQUE: Multidetector CT imaging of the abdomen and pelvis was performed using the standard protocol following bolus  administration of intravenous contrast. CONTRAST:  153mL ISOVUE-300 IOPAMIDOL (ISOVUE-300) INJECTION 61% COMPARISON:  Pelvic CT 05/28/2008 and CT abdomen/ pelvis 02/11/2008 FINDINGS: Lung bases demonstrate bibasilar dependent atelectasis and small amount of bilateral pleural fluid. Moderate cardiomegaly with calcification of the mitral valve. Abdominal images demonstrate evidence of previous cholecystectomy. Persistent moderate dilatation of the common bile duct measuring 1.8 cm with mild prominence of the central intrahepatic ducts without significant change. There is fatty atrophy of the pancreas. The spleen, liver and adrenal glands within normal. Kidneys are normal in size without hydronephrosis or nephrolithiasis. There are several left renal cysts as well as several right renal cortical hypodensities which are too small characterize but likely cysts. Mild prominence of the left ureter with mucosal enhancement. Subtle middle enhancement of the right ureter which is not dilated. Distal ureters are obscured by moderate streak artifact from bilateral hip prostheses. Mild calcified plaque involving the abdominal aorta. Stomach is within normal. There are multiple surgical clips in right mid to lower abdomen. There is a surgical suture line over the right colon and possible small bowel loop in the right abdomen. Mild diverticulosis of the colon. There is mild wall thickening of the distal descending and sigmoid colon. Uterus and adnexa are unremarkable. No definite dilated small bowel loops. Pelvic images demonstrate mild enhancement and thickening of the bladder wall. Much of the bladder is obscured due to the streak artifact from the bilateral hip prostheses. There is a lytic process with soft tissue density occupying the right sacrum measuring 2.5 x 4 cm. This likely represents a metastatic lesion in this patient with known history breast and colon cancer. Postsurgical changes of the spine. IMPRESSION: Mild wall  thickening of the distal descending and sigmoid  colon which may be due to a mild acute colitis. Mild enhancement and wall thickening of the bladder which may be due to cystitis. Lytic process with soft tissue mass involving the right inferior sacrum measuring 2.5 x 4 cm likely a metastatic lesion. Mild bibasilar atelectasis with small bilateral pleural effusions. Moderate cardiomegaly. Stable post cholecystectomy prominence of the common bile duct measuring 1.8 cm. Bilateral renal cysts. Diverticulosis of the colon. Electronically Signed   By: Marin Olp M.D.   On: 09/28/2015 23:56    EKG: Not performed, will obtain as appropriate.   Assessment/Plan  1. Sepsis, suspected secondary to UTI  - Meets sepsis criteria on admission with fever, tachycardia, elevated lactate, and UTI - Urine and blood cultures are incubating - Historically, patient's urine cultures have grown pan-sensitive Escherichia coli - Empiric vancomycin and Zosyn administered in the emergency department - Continue empiric antibiotics with cefepime while awaiting culture data - Initial lactate 3.50, normalized with IVF - Trend PCT   2. Hypertension  - Patient has reported history of such but has been running on the low side here - Monitor and initiate treatment prn  3. Hypothyroidism  - Appears stable - Continue current dose Synthroid  4. Depression, anxiety  - Difficult to assess at this time given the clinical scenario - Continue current dose Zoloft and prn Ativan  5. Rheumatoid arthritis  - Stable - Continue current dose Plaquenil and prednisone  6. Thrombocytopenia  - Platelet count 143,000 on admission - Stable relative to prior measurements - Possibly secondary to Plaquenil - No sign of active blood loss   DVT prophylaxis: sq Lovenox   Code Status: DNR  Family Communication: Son at bedside  Disposition Plan: Admit to med/surg  Consults called: None   Admission status: Inpatient    Vianne Bulls,  MD [facility] Pager [phone]  If 7PM-7AM, please contact night-coverage [email or web address] Password [email or web address]  09/29/2015, 1:52 AM

## 2015-09-29 NOTE — Progress Notes (Signed)
Pharmacy Antibiotic Note  [name] is a 82 y.o. female admitted on 09/28/2015 with sepsis.  Pharmacy has been consulted for vancomycin and zosyn dosing. Tmax is 102 and WBC is WNL. SCr is 0.95 and lactic acid is elevated at 3.5>>1.63  Pharmacy is consulted to transition pt to cefepime for UTI.   Plan: Stop vanc/zosyn Start cefepime 2g IV q12h Monitor culture data, renal function and clinical course  Height: 5\' 5"  (165.1 cm) Weight: 147 lb (66.679 kg) IBW/kg (Calculated) : 57  Temp (24hrs), Avg:99.7 F (37.6 C), Min:98 F (36.7 C), Max:102 F (38.9 C)   Recent Labs Lab 09/28/15 1953 09/28/15 2007 09/28/15 2254  WBC 9.9  --   --   CREATININE 0.95  --   --   LATICACIDVEN  --  3.50* 1.63    Estimated Creatinine Clearance: 43.2 mL/min (by C-G formula based on Cr of 0.95).    Allergies  Allergen Reactions  . Demerol   . Fentanyl   . Metoclopramide Other (See Comments)    crazy  . Metoclopramide Hcl   . Morphine And Related   . Nsaids     Upset stomach  . Oxycodone-Acetaminophen     Other reaction(s): Hallucinations  . Pentazocine     Other reaction(s): Hallucinations  . Pentazocine Lactate   . Versed [Midazolam] Other (See Comments)    Antimicrobials this admission: Vanc 5/23>>5/24 Zosyn 5/23>>5/24 Cefepime 5/24>>  Dose adjustments this admission: N/A  Andrey Cota. Diona Foley, PharmD, BCPS Clinical Pharmacist Pager [phone]  09/29/2015 1:54 AM

## 2015-09-29 NOTE — Consult Note (Signed)
Consultation Note Date: 09/29/2015   Patient Name: [name]  DOB: Jul 15, 1933  MRN: [id number]  Age / Sex: 82 y.o., female  PCP: [place] Referring Physician: Lavina Hamman, MD  Reason for Consultation: Establishing goals of care  HPI/Patient Profile:   82 y.o. female   admitted on 09/28/2015 with PMH medical history significant for hypertension, hypothyroidism, rheumatoid arthritis, depression, anxiety, and dementia  Presents from her assisted living facility for evaluation of fevers and lethargy of 3 days' duration.  The patient's son  confirms that she has baseline dementia, but there was some increased confusion associated with this illness.   Per family continued physical, functional and cognitive decline over the past many months, had been seen by [facility] in the community.  In  ED, patient is found to be febrile to 38.9 C, saturating adequately on room air, tachycardic low 100s, and with blood pressures in the 100/55 range. Chemistry panel is largely unremarkable and CBC is notable for a platelet count of 143,000. Lactic acid is elevated to value 3.50 And urinalysis features many bacteria, moderate leukocyte, positive nitrite, and 6-30 WBC. Urine and blood cultures were obtained,, symptomatic care provided with Dilaudid, and normal saline was run at 125 cc/hr. In the antibiotics were initiated with vancomycin and Zosyn.   Admitted for continued work-up and stabilization.  CT- abdomen:    H/O colon cancer  IMPRESSION: Mild wall thickening of the distal descending and sigmoid colon which may be due to a mild acute colitis. Mild enhancement and wall thickening of the bladder which may be due to cystitis. Lytic process with soft tissue mass involving the right inferior sacrum measuring 2.5 x 4 cm likely a metastatic lesion. Mild bibasilar atelectasis with small bilateral pleural  effusions. Moderate cardiomegaly. Stable post cholecystectomy prominence of the common bile duct measuring 1.8 cm.   Bilateral renal cysts.     Diverticulosis of the colon.   Clinical Assessment and Goals of Care:  This NP Wadie Lessen reviewed medical records, received report from team, assessed the patient and then meet at the patient's bedside along with her son/  Shanon Brow  to discuss diagnosis, prognosis, GOC, EOL wishes disposition and options.   A detailed discussion was had today regarding advanced directives.  Concepts specific to code status, artifical feeding and hydration, continued IV antibiotics and rehospitalization was had.  The difference between a aggressive medical intervention path  and a palliative comfort care path for this patient at this time was had.  Values and goals of care important to patient and family were attempted to be elicited.  Concept of Hospice and Palliative Care were discussed.  MOST form introduced  Natural trajectory and expectations at EOL were discussed.  Questions and concerns addressed.  Hard Choices booklet left for review. Family encouraged to call with questions or concerns.  PMT will continue to support holistically.   HCPOA- son    SUMMARY OF RECOMMENDATIONS    - Focus of care is comfort and dignity - schedule pain  medication at family request " she cannot ask for it if she needs it" - begin to minimize medications and de-escalate level of care -continue fluids and antibiotics for next 24 hrs -re-meet with this NP in the morning at 1000 for further clarification of goals, leaning toward hospice   Code Status/Advance Care Planning:  DNR   Symptom Management:   Pain: Dilaudid 0.5 mg IV every 4 hrs scheduled for abdominal pain  Palliative Prophylaxis:   Aspiration, Bowel Regimen, Delirium Protocol, Frequent Pain Assessment and Oral Care   Psycho-social/Spiritual:   Desire for further Chaplaincy support:no- declined at this  time  Additional Recommendations: Education on Hospice  Prognosis:   < 2 weeks  Discharge Planning: To Be Determined      Primary Diagnoses: Present on Admission:  . Sepsis ([place]) . Essential hypertension, benign . Anxiety . Depression . Postsurgical hypothyroidism . UTI (urinary tract infection) . Sepsis, unspecified organism ([place])  I have reviewed the medical record, interviewed the patient and family, and examined the patient. The following aspects are pertinent.  Past Medical History  Diagnosis Date  . PE (pulmonary embolism)   . Subarachnoid hemorrhage ([place])   . Spastic hemiplegia affecting nondominant side ([place])   . Intracerebral hemorrhage ([place])   . Hypertension   . Arthritis   . Stroke ([place]) 3/12  . Gastrointestinal problem   . Depression   . Breast CA ([place])     mastectomy  . Colon cancer ([place])   . Tremor     benign essential  . Rheumatoid arthritis(714.0)   . Hypothyroidism   . Dysrhythmia     palpitation  . Lymphedema of arm     right  . Atrophic vaginitis   . Osteoporosis   . Multinodular goiter   . [place] disease 2002  . Thrombocytopenia ([place]) 2002  . Gastroparesis 7/03  . Colon cancer ([place]) 2007  . Pulmonary embolism ([place]) 2003    chronic coumadin  . CVA (cerebral vascular accident) ([place])   . Lymphedema of upper extremity 12/14/2014   Social History   Social History  . Marital Status: Married    Spouse Name: N/A  . Number of Children: 4  . Years of Education: college   Occupational History  . Retired    Social History Main Topics  . Smoking status: Former Smoker    Quit date: 02/25/1970  . Smokeless tobacco: Never Used  . Alcohol Use: No     Comment: occ wine  . Drug Use: No  . Sexual Activity: No   Other Topics Concern  . None   Social History Narrative   Patient does not drink caffeine.   Patient is right handed.    Resides at [facility].     Family History  Problem Relation Age of Onset  . Cancer Father   .  Heart failure Sister   . Tremor Maternal Grandmother   . Tremor Maternal Grandfather   . Tremor Paternal Grandmother   . Tremor Paternal Grandfather    Scheduled Meds: . aspirin  81 mg Oral Daily  . ceFEPime (MAXIPIME) IV  2 g Intravenous Q12H  . cholecalciferol  2,000 Units Oral Daily  . colestipol  1 g Oral BID  . divalproex  125 mg Oral Q8H  . enoxaparin (LOVENOX) injection  40 mg Subcutaneous Q24H  . hydrocortisone sod succinate (SOLU-CORTEF) inj  100 mg Intravenous Q8H  . hydroxychloroquine  200 mg Oral BID  . levothyroxine  225 mcg Oral QAC breakfast  .  LORazepam  0.5 mg Oral BID  . primidone  175 mg Oral BID  . saccharomyces boulardii  250 mg Oral BID  . sertraline  100 mg Oral Daily  . sodium chloride flush  3 mL Intravenous Q12H   Continuous Infusions: . sodium chloride 100 mL/hr at 09/29/15 0323   PRN Meds:.sodium chloride, albuterol, bisacodyl, LORazepam, ondansetron **OR** ondansetron (ZOFRAN) IV, polyethylene glycol, sodium chloride flush Medications Prior to Admission:  Prior to Admission medications   Medication Sig Start Date End Date Taking? Authorizing Provider  albuterol (PROVENTIL HFA;VENTOLIN HFA) 108 (90 BASE) MCG/ACT inhaler Inhale 2 puffs into the lungs every 6 (six) hours as needed for wheezing or shortness of breath.    Historical Provider, MD  amoxicillin (AMOXIL) 500 MG capsule Take 2,000 mg by mouth See admin instructions. Reported on 09/20/2015 03/10/14   Historical Provider, MD  aspirin 81 MG tablet Take 81 mg by mouth daily.    Historical Provider, MD  Cholecalciferol (VITAMIN D) 2000 UNITS CAPS Take 1 capsule by mouth daily.    Historical Provider, MD  colestipol (COLESTID) 1 G tablet Take 1 g by mouth 2 (two) times daily.    Historical Provider, MD  CRANBERRY PO Take 425 mg by mouth daily.    Historical Provider, MD  divalproex (DEPAKOTE SPRINKLE) 125 MG capsule Take by mouth 3 (three) times daily.    Historical Provider, MD  furosemide (LASIX) 20  MG tablet Take 10 mg by mouth daily.     Historical Provider, MD  hydroxychloroquine (PLAQUENIL) 200 MG tablet Take 200 mg by mouth 2 (two) times daily.     Historical Provider, MD  Infant Care Products [facility] EX) Apply topically daily as needed (for skin irratation).     Historical Provider, MD  LACTASE PO Take 2 tablets by mouth 3 (three) times daily with meals.     Historical Provider, MD  levothyroxine (SYNTHROID, LEVOTHROID) 200 MCG tablet Take 200 mcg by mouth daily before breakfast.     Historical Provider, MD  levothyroxine (SYNTHROID, LEVOTHROID) 25 MCG tablet Take 25 mcg by mouth daily before breakfast.    Historical Provider, MD  LORazepam (ATIVAN) 0.5 MG tablet Take 1 tablet (0.5 mg total) by mouth 2 (two) times daily. Hold for sedation 08/26/13   Delfina Redwood, MD  LORazepam (ATIVAN) 0.5 MG tablet Take 0.25 mg by mouth daily as needed for anxiety.    Historical Provider, MD  MINERAL OIL PO Place 2 drops into both ears once a week.     Historical Provider, MD  nystatin ointment (MYCOSTATIN) Apply 1 application topically as needed.    Historical Provider, MD  Petrolatum ([place]) OINT Apply topically as needed (for skin irritation).    Historical Provider, MD  potassium chloride (K-DUR) 10 MEQ tablet Take 10 mEq by mouth every other day.     Historical Provider, MD  predniSONE (DELTASONE) 5 MG tablet Take 5 mg by mouth daily with breakfast.  03/25/13   Historical Provider, MD  primidone (MYSOLINE) 250 MG tablet Take 0.5 tablets (125 mg total) by mouth 2 (two) times daily. Take with 1 tablet of Primidone 50mg  07/23/14   Kathrynn Ducking, MD  primidone (MYSOLINE) 50 MG tablet Take 1 tablet (50 mg total) by mouth 2 (two) times daily. Take with 1/2 tablet Primidone 250 mg twice daily 07/18/14   Kathrynn Ducking, MD  sertraline (ZOLOFT) 100 MG tablet Take 100 mg by mouth daily.     Historical Provider, MD  spiritus frumenti (ETHYL ALCOHOL) SOLN Take 1 each by mouth every  evening. Wine (3 ounces)    Historical Provider, MD  UNABLE TO FIND Med Name: Tussin 4 tsp po tid prn    Historical Provider, MD   Allergies  Allergen Reactions  . Demerol   . Fentanyl   . Metoclopramide Other (See Comments)    crazy  . Metoclopramide Hcl   . Morphine And Related   . Nsaids     Upset stomach  . Oxycodone-Acetaminophen     Other reaction(s): Hallucinations  . Pentazocine     Other reaction(s): Hallucinations  . Pentazocine Lactate   . Versed [Midazolam] Other (See Comments)   Review of Systems  Unable to perform ROS: Acuity of condition    Physical Exam  Constitutional: She appears well-developed. She appears lethargic.  HENT:  -dry buccal membranes  Cardiovascular: Normal rate, regular rhythm and normal heart sounds.   Pulmonary/Chest: She has decreased breath sounds in the right lower field and the left lower field.  Abdominal: There is tenderness in the right lower quadrant.  Neurological: She appears lethargic.  Skin: Skin is warm and dry.    Vital Signs: BP 92/45 mmHg  Pulse 85  Temp(Src) 99.2 F (37.3 C) (Axillary)  Resp 17  Ht 5\' 5"  (1.651 m)  Wt 66.679 kg (147 lb)  BMI 24.46 kg/m2  SpO2 95%  LMP 06/08/1986 Pain Assessment: No/denies pain   Pain Score: 0-No pain   SpO2: SpO2: 95 % O2 Device:SpO2: 95 % O2 Flow Rate: .   IO: Intake/output summary:  Intake/Output Summary (Last 24 hours) at 09/29/15 1027 Last data filed at 09/29/15 0600  Gross per 24 hour  Intake 511.67 ml  Output      2 ml  Net 509.67 ml    LBM: Last BM Date:  (unable to assess) Baseline Weight: Weight: 66.679 kg (147 lb) Most recent weight: Weight: 66.679 kg (147 lb)      Palliative Assessment/Data:   20%    Flowsheet Rows        Most Recent Value   Intake Tab    Referral Department  -- [ED]   Unit at Time of Referral  ER   Palliative Care Primary Diagnosis  Sepsis/Infectious Disease   Date Notified  09/28/15   Palliative Care Type  New Palliative care    Reason for referral  Clarify Goals of Care   Date of Admission  09/28/15   Date first seen by Palliative Care  09/29/15   # of days Palliative referral response time  1 Day(s)   # of days IP prior to Palliative referral  0   Clinical Assessment    Psychosocial & Spiritual Assessment    Palliative Care Outcomes      Discussed with Dr Posey Pronto   Time In: 1015 Time Out:  1130 Time Total: 75 min Greater than 50%  of this time was spent counseling and coordinating care related to the above assessment and plan.  Signed by: Wadie Lessen, NP   Please contact Palliative Medicine Team phone at [phone] for questions and concerns.  For individual provider: See Shea Evans

## 2015-09-29 NOTE — Progress Notes (Signed)
[facility] OF CARE NOTE Patient: [name] [id number]   PCP: [place] DOB: 05/30/1933   DOA: 09/28/2015   DOS: 09/29/2015    Patient was admitted by my colleague Dr.Opyd earlier on 09/29/2015. I have reviewed the H&P as well as assessment and plan and agree with the same. Important changes in the plan are listed below.  Plan of care: Principal Problem:   Sepsis ([place]) Escherichia coli bacteremia. Blood cultures are positive for Escherichia coli. Currently on cefepime continue IV fluids. Given normal saline bolus. Prednisone changed to Solu-Cortef for stress dose steroids. Likely from UTI.  Active Problems: Goals of care discussion. Palliative care was consulted for the patient. Patient is orally following up with palliative care as an outpatient. I was informed regarding the meeting between palliative care and patient's son. It was decided to continue with the IV antibiotics and IV fluids for now but primary goal for patient's son for patient is comfort and controlling of her abdomen pain. Based on the discussion palliative care has decided that the patient schedule Dilaudid. We will be another meeting tomorrow at 10 AM between palliative care and patient son regarding further discussion of goals of care. Given the focus more on comfort patient will likely need inpatient hospice.  Author: Berle Mull, MD [facility] Pager: [phone] 09/29/2015 2:27 PM   If 7PM-7AM, please contact night-coverage at [email or web address], password [facility]

## 2015-09-29 NOTE — Progress Notes (Signed)
Patient arrived on unit via stretcher. Patient's son at the bedside. Patient's is nonverbal, RN not able to assess mental status. Patient oriented to room and staff. Patient placed on telemetry, Lenora notified. Skin assessment completed with Melton Alar, check flowsheets. Orders have been reviewed and implemented. Call light has been placed within reach and bed alarm has been activated. RN will continue to monitor patient.   Nena Polio BSN, RN  Phone Number: [phone]

## 2015-09-30 DIAGNOSIS — A4159 Other Gram-negative sepsis: Secondary | ICD-10-CM | POA: Diagnosis present

## 2015-09-30 DIAGNOSIS — E039 Hypothyroidism, unspecified: Secondary | ICD-10-CM

## 2015-09-30 DIAGNOSIS — F0390 Unspecified dementia without behavioral disturbance: Secondary | ICD-10-CM | POA: Diagnosis present

## 2015-09-30 DIAGNOSIS — D696 Thrombocytopenia, unspecified: Secondary | ICD-10-CM

## 2015-09-30 DIAGNOSIS — B9620 Unspecified Escherichia coli [E. coli] as the cause of diseases classified elsewhere: Secondary | ICD-10-CM | POA: Diagnosis present

## 2015-09-30 DIAGNOSIS — R7881 Bacteremia: Secondary | ICD-10-CM | POA: Diagnosis present

## 2015-09-30 LAB — URINE CULTURE

## 2015-09-30 LAB — GLUCOSE, CAPILLARY: GLUCOSE-CAPILLARY: 159 mg/dL — AB (ref 65–99)

## 2015-09-30 MED ORDER — HYDROMORPHONE HCL 1 MG/ML IJ SOLN
1.0000 mg | INTRAMUSCULAR | Status: DC
  Administered 2015-09-30: 1 mg via INTRAVENOUS

## 2015-09-30 MED ORDER — LORAZEPAM 2 MG/ML IJ SOLN
1.0000 mg | Freq: Four times a day (QID) | INTRAMUSCULAR | Status: DC
  Administered 2015-09-30: 1 mg via INTRAVENOUS
  Filled 2015-09-30: qty 1

## 2015-09-30 NOTE — Discharge Summary (Signed)
Discharge Summary  Patient Name: [name] Admission Date/Time: 09/28/2015  6:36 PM  Age/Sex:82 y.o.female MRN#: [id number]  DOB: 1934/01/03 Attending Provider: Marja Kays, MD        PCP/Outpatient Specialists:  Patient Care Team: [facility] as PCP - General ([place])   Admit date: 09/28/2015 Discharge date: 09/30/2015  LOS: 1 day   Time spent: >30 minutes   Recommendations for Outpatient Follow-up:  1. Patient to be under comfort care/hospice measures. Medications to be determined by Hospice [facility]). IV to be left in for medication administration given poor oral intake.      Discharge Diagnoses:  Active Hospital Problems   Diagnosis Date Noted  . Sepsis ([place]) 09/29/2015  . E coli bacteremia 09/30/2015    Priority: High  . UTI (urinary tract infection) 09/29/2015    Priority: High  . Dementia 09/30/2015  . Thrombocytopenia ([place]) 09/30/2015  . Sepsis due to Enterobacter species ([place]) 09/30/2015  . DNR (do not resuscitate)   . Palliative care encounter   . Generalized abdominal pain   . Postsurgical hypothyroidism 01/29/2014  . Anxiety 10/05/2013  . Depression 10/05/2013  . Essential hypertension, benign 08/18/2013  . Hypothyroidism 08/18/2013  . Rheumatoid arthritis ([place]) 08/18/2013    Resolved Hospital Problems   Diagnosis Date Noted Date Resolved  No resolved problems to display.    Discharge Condition:  Poor   Discharge Activity: as tolerated  Diet recommendation:  as tolerated, palliative PO intake   History of Present Illness:  82 y.o. female with medical history significant for hypertension, hypothyroidism, rheumatoid arthritis, depression, anxiety, and dementia Who presents from her assisted living facility for evaluation of fevers and lethargy of 3 days' duration. There had been no cough noted at her facility and the patient had not made any specific complaints, but was noted to be afebrile for the past  3 days and with worsening lethargy. The patient's son at bedside confirms that she has baseline dementia, but there was some increased confusion associated with this illness. She has history of urinary tract infections in the past with cultures having grown pan-sensitive Escherichia coli.  ED Course: Upon arrival to the ED, patient is found to be febrile to 38.9 C, saturating adequately on room air, tachycardic low 100s, and with blood pressures in the 100/55 range. Chemistry panel is largely unremarkable and CBC is notable for a platelet count of 143,000. Lactic acid is elevated to value 3.50 And urinalysis features many bacteria, moderate leukocyte, positive nitrite, and 6-30 WBC. Urine and blood cultures were obtained,, symptomatic care provided with Dilaudid, and normal saline was run at 125 cc/hr. In the ED antibiotics were initiated with vancomycin and Zosyn. Tachycardia resolved with the IV fluids, patient remained hemodynamically stable, and will be admitted to the hospital for ongoing evaluation and management of sepsis suspected secondary to UTI.  Hospital Course:  Patient was initially continued on IV cefepime to treat her likely UTI, as urine culture from previous admission showed pansensitive E.coli. Urine culture this admission showing multiple organisms and unfortunately her blood cultures are also positive for it e. Coli and enterobacteriaceae. Family had discussions with the team regarding her prognosis and primary goal. She has had issues with oral intake already and was following with palliative care as outpatient. Sons were concerned about treating UTI/infection first, however as it is in the blood stream, duration would be longer. i also advised that given this is recurrence her likelihood of ending up in the  same situation is high. Family's goal also is to provide comfort care. Therefore, Hospice nurse had also discussed with them and they are agreeable to Hospice. Social work has  confirmed with [facility] that she is appropriate for discharge to their facility. Patient was placed on scheduled IV pain medication and ativan to assist with any discomfort. We will leave IV in and allow [facility] to determine adequate medications for comfort. Patient was no longer tachycardic or febrile on discharge, however suspect that this will arise without antibiotic treatment. Otherwise the patient was stable for discharge under Hospice Care.   Procedures:  None  Consultations:  CALL CODE SEPSIS BY CALLING [facility] AT [phone]  CONSULT TO PALLIATIVE CARE  Discharge Exam: Temp:  [97.3 F (36.3 C)-98.6 F (37 C)] 98.6 F (37 C) (05/25 0941) Pulse Rate:  [74-90] 74 (05/25 0502) Resp:  [15-18] 18 (05/25 0941) BP: (90-112)/(43-52) 90/45 mmHg (05/25 0941) SpO2:  [90 %-95 %] 91 % (05/25 0941)  Physical Exam  Constitutional: No distress.  Moaning and imcomprehensible, won't open eyes.   Cardiovascular: Normal rate, regular rhythm and normal heart sounds.   Pulmonary/Chest: Effort normal and breath sounds normal. No respiratory distress. She has no wheezes.  Abdominal: Soft. Bowel sounds are normal. She exhibits no distension.  Skin: Skin is warm and dry. She is not diaphoretic.    Discharge Instructions You were cared for by a hospitalist during your hospital stay. If you have any questions about your discharge medications or the care you received while you were in the hospital after you are discharged, you can call the unit and asked to speak with the hospitalist on call if the hospitalist that took care of you is not available. Once you are discharged, your primary care physician will handle any further medical issues. Please note that NO REFILLS for any discharge medications will be authorized once you are discharged, as it is imperative that you return to your primary care physician (or establish a relationship with a primary care physician if you do not have one) for  your aftercare needs so that they can reassess your need for medications and monitor your lab values.  Discharge Instructions    Diet general    Complete by:  As directed   As patient can tolerated, palliative PO     Discharge instructions    Complete by:  As directed   Patient to be under comfort care/hospice measures. Medications to be determined by Hospice. Please leave IV in.     Increase activity slowly    Complete by:  As directed            Current Discharge Medication List    CONTINUE these medications which have NOT CHANGED   Details  albuterol (PROVENTIL HFA;VENTOLIN HFA) 108 (90 BASE) MCG/ACT inhaler Inhale 2 puffs into the lungs every 6 (six) hours as needed for wheezing or shortness of breath.    Carboxymethylcellulose Sodium (REFRESH TEARS OP) Place 1 drop into both eyes at bedtime.    divalproex (DEPAKOTE SPRINKLE) 125 MG capsule Take by mouth 3 (three) times daily.    LORazepam (ATIVAN) 0.5 MG tablet Take 0.5 mg by mouth 2 (two) times daily.     Petrolatum ([place]) OINT Apply topically as needed (for skin irritation).      STOP taking these medications     acetaminophen (TYLENOL) 500 MG tablet      aspirin 81 MG tablet      Cholecalciferol (VITAMIN D) 2000  UNITS CAPS      colestipol (COLESTID) 1 G tablet      CRANBERRY PO      furosemide (LASIX) 20 MG tablet      hydroxychloroquine (PLAQUENIL) 200 MG tablet      Infant Care Products (DERMACLOUD EX)      LACTASE PO      levothyroxine (SYNTHROID, LEVOTHROID) 200 MCG tablet      levothyroxine (SYNTHROID, LEVOTHROID) 25 MCG tablet      nystatin ointment (MYCOSTATIN)      potassium chloride (K-DUR) 10 MEQ tablet      predniSONE (DELTASONE) 5 MG tablet      primidone (MYSOLINE) 250 MG tablet      primidone (MYSOLINE) 50 MG tablet      sertraline (ZOLOFT) 100 MG tablet      UNABLE TO FIND      MINERAL OIL PO      spiritus frumenti (ETHYL ALCOHOL) SOLN         Allergies    Allergen Reactions  . Demerol   . Fentanyl   . Metoclopramide Other (See Comments)    crazy  . Metoclopramide Hcl   . Morphine And Related   . Nsaids     Upset stomach  . Oxycodone-Acetaminophen     Other reaction(s): Hallucinations  . Pentazocine     Other reaction(s): Hallucinations  . Pentazocine Lactate   . Versed [Midazolam] Other (See Comments)    The results of significant diagnostics from this hospitalization (including imaging, microbiology, ancillary and laboratory) are listed below for reference.    Significant Diagnostic Studies: Dg Chest 1 View  09/28/2015  CLINICAL DATA:  Acute onset of fever and lethargy. Altered mental status. Initial encounter. EXAM: CHEST 1 VIEW COMPARISON:  Chest radiograph from 04/15/2015 FINDINGS: The lungs are well-aerated. Vascular congestion is noted. Patchy bilateral airspace opacities raise concern for pulmonary edema or pneumonia. A small left pleural effusion is noted. No pneumothorax is seen. The cardiomediastinal silhouette is mildly enlarged. No acute osseous abnormalities are seen. Postoperative change is noted about the thyroid bed and at the right axilla. IMPRESSION: Vascular congestion and mild cardiomegaly. Patchy bilateral airspace opacities raise concern for pulmonary edema or pneumonia. Small left pleural effusion noted. Electronically Signed   By: Garald Balding M.D.   On: 09/28/2015 22:37   Ct Abdomen Pelvis W Contrast  09/28/2015  CLINICAL DATA:  Sepsis and abdominal pain.  Frequent UTIs. EXAM: CT ABDOMEN AND PELVIS WITH CONTRAST TECHNIQUE: Multidetector CT imaging of the abdomen and pelvis was performed using the standard protocol following bolus administration of intravenous contrast. CONTRAST:  161mL ISOVUE-300 IOPAMIDOL (ISOVUE-300) INJECTION 61% COMPARISON:  Pelvic CT 05/28/2008 and CT abdomen/ pelvis 02/11/2008 FINDINGS: Lung bases demonstrate bibasilar dependent atelectasis and small amount of bilateral pleural fluid.  Moderate cardiomegaly with calcification of the mitral valve. Abdominal images demonstrate evidence of previous cholecystectomy. Persistent moderate dilatation of the common bile duct measuring 1.8 cm with mild prominence of the central intrahepatic ducts without significant change. There is fatty atrophy of the pancreas. The spleen, liver and adrenal glands within normal. Kidneys are normal in size without hydronephrosis or nephrolithiasis. There are several left renal cysts as well as several right renal cortical hypodensities which are too small characterize but likely cysts. Mild prominence of the left ureter with mucosal enhancement. Subtle middle enhancement of the right ureter which is not dilated. Distal ureters are obscured by moderate streak artifact from bilateral hip prostheses. Mild calcified plaque involving the abdominal  aorta. Stomach is within normal. There are multiple surgical clips in right mid to lower abdomen. There is a surgical suture line over the right colon and possible small bowel loop in the right abdomen. Mild diverticulosis of the colon. There is mild wall thickening of the distal descending and sigmoid colon. Uterus and adnexa are unremarkable. No definite dilated small bowel loops. Pelvic images demonstrate mild enhancement and thickening of the bladder wall. Much of the bladder is obscured due to the streak artifact from the bilateral hip prostheses. There is a lytic process with soft tissue density occupying the right sacrum measuring 2.5 x 4 cm. This likely represents a metastatic lesion in this patient with known history breast and colon cancer. Postsurgical changes of the spine. IMPRESSION: Mild wall thickening of the distal descending and sigmoid colon which may be due to a mild acute colitis. Mild enhancement and wall thickening of the bladder which may be due to cystitis. Lytic process with soft tissue mass involving the right inferior sacrum measuring 2.5 x 4 cm likely a  metastatic lesion. Mild bibasilar atelectasis with small bilateral pleural effusions. Moderate cardiomegaly. Stable post cholecystectomy prominence of the common bile duct measuring 1.8 cm. Bilateral renal cysts. Diverticulosis of the colon. Electronically Signed   By: Marin Olp M.D.   On: 09/28/2015 23:56    Microbiology: Recent Results (from the past 240 hour(s))  Urine culture     Status: Abnormal   Collection Time: 09/28/15  7:39 PM  Result Value Ref Range Status   Specimen Description URINE, CATHETERIZED  Final   Special Requests CX ADDED AT 2132 ON 09/28/15  Final   Culture MULTIPLE SPECIES PRESENT, SUGGEST RECOLLECTION (A)  Final   Report Status 09/30/2015 FINAL  Final  Blood Culture (routine x 2)     Status: None (Preliminary result)   Collection Time: 09/28/15  7:53 PM  Result Value Ref Range Status   Specimen Description LEFT ANTECUBITAL  Final   Special Requests BOTTLES DRAWN AEROBIC AND ANAEROBIC  5CC  Final   Culture  Setup Time   Final    GRAM NEGATIVE RODS IN BOTH AEROBIC AND ANAEROBIC BOTTLES CRITICAL RESULT CALLED TO, READ BACK BY AND VERIFIED WITH: CLamont Snowball D AT 0950 ON [id number] BY M WILSON     Culture GRAM NEGATIVE RODS  Final   Report Status PENDING  Incomplete  Blood Culture (routine x 2)     Status: Abnormal (Preliminary result)   Collection Time: 09/28/15  8:57 PM  Result Value Ref Range Status   Specimen Description BLOOD LEFT FOREARM  Final   Special Requests BOTTLES DRAWN AEROBIC AND ANAEROBIC 10CC  Final   Culture  Setup Time   Final    GRAM NEGATIVE RODS IN BOTH AEROBIC AND ANAEROBIC BOTTLES CRITICAL RESULT CALLED TO, READ BACK BY AND VERIFIED WITH: Chiquita Loth Pharm.D. 9:50 09/29/15 ([email or web address])    Culture ESCHERICHIA COLI SUSCEPTIBILITIES TO FOLLOW  (A)  Final   Report Status PENDING  Incomplete  Blood Culture ID Panel (Reflexed)     Status: Abnormal   Collection Time: 09/28/15  8:57 PM  Result Value Ref Range Status   Enterococcus species  NOT DETECTED NOT DETECTED Final   Vancomycin resistance NOT DETECTED NOT DETECTED Final   Listeria monocytogenes NOT DETECTED NOT DETECTED Final   Staphylococcus species NOT DETECTED NOT DETECTED Final   Staphylococcus aureus NOT DETECTED NOT DETECTED Final   Methicillin resistance NOT DETECTED NOT DETECTED Final   Streptococcus species NOT  DETECTED NOT DETECTED Final   Streptococcus agalactiae NOT DETECTED NOT DETECTED Final   Streptococcus pneumoniae NOT DETECTED NOT DETECTED Final   Streptococcus pyogenes NOT DETECTED NOT DETECTED Final   Acinetobacter baumannii NOT DETECTED NOT DETECTED Final   Enterobacteriaceae species DETECTED (A) NOT DETECTED Final    Comment: CRITICAL RESULT CALLED TO, READ BACK BY AND VERIFIED WITH: CLamont Snowball.D. 9:50 09/29/15 (wilsonm)    Enterobacter cloacae complex NOT DETECTED NOT DETECTED Final   Escherichia coli DETECTED (A) NOT DETECTED Final    Comment: CRITICAL RESULT CALLED TO, READ BACK BY AND VERIFIED WITH: CLamont Snowball.D. 9:50 09/29/15 (wilsonm)    Klebsiella oxytoca NOT DETECTED NOT DETECTED Final   Klebsiella pneumoniae NOT DETECTED NOT DETECTED Final   Proteus species NOT DETECTED NOT DETECTED Final   Serratia marcescens NOT DETECTED NOT DETECTED Final   Carbapenem resistance NOT DETECTED NOT DETECTED Final   Haemophilus influenzae NOT DETECTED NOT DETECTED Final   Neisseria meningitidis NOT DETECTED NOT DETECTED Final   Pseudomonas aeruginosa NOT DETECTED NOT DETECTED Final   Candida albicans NOT DETECTED NOT DETECTED Final   Candida glabrata NOT DETECTED NOT DETECTED Final   Candida krusei NOT DETECTED NOT DETECTED Final   Candida parapsilosis NOT DETECTED NOT DETECTED Final   Candida tropicalis NOT DETECTED NOT DETECTED Final     Labs: CBC:  Recent Labs Lab 09/28/15 1953  WBC 9.9  HGB 13.2  HCT 40.9  MCV 99.3  PLT 143*    CBG:  Recent Labs Lab 09/29/15 0742 09/30/15 0726  GLUCAP 109* 159*    Basic Metabolic  Panel:  Recent Labs Lab 09/28/15 1953 09/29/15 0608  GLUCOSE 140* 99  NA 141 144  K 4.3 4.0  CL 103 107  CO2 25 24  BUN 19 23*  CREATININE 0.95 0.92  CALCIUM 8.9 8.8*    Liver Function Tests:  Recent Labs Lab 09/28/15 1953  AST 42*  ALT 26  ALKPHOS 144*  BILITOT 0.9  PROT 6.8  ALBUMIN 3.5   Latest HbA1C/Lipids/Thyroid/Anemia: Lab Results  Component Value Date   HGBA1C * 08/17/2010    5.8 (NOTE)                                                                       According to the ADA Clinical Practice Recommendations for 2011, when HbA1c is used as a screening test:   >=6.5%   Diagnostic of Diabetes Mellitus           (if abnormal result  is confirmed)  5.7-6.4%   Increased risk of developing Diabetes Mellitus  References:Diagnosis and Classification of Diabetes Mellitus,Diabetes [id number] 1):S62-S69 and Standards of Medical Care in         Diabetes - 2011,Diabetes [id number]  (Suppl 1):S11-S61.   TSH 16.70* 01/01/2014   IRON 54 10/16/2008   TIBC 367 10/16/2008   FERRITIN 35 10/16/2008   VITAMINB12 420 04/23/2008    Signed: Marja Kays, MD [facility] 09/30/2015, 12:59 PM

## 2015-09-30 NOTE — Progress Notes (Signed)
Daily Progress Note   Patient Name: [name]       Date: 09/30/2015 DOB: Dec 14, 1933  Age: 82 y.o. MRN#: [id number] Attending Physician: Marja Kays, MD Primary Care Physician: [facility] Date: 09/28/2015  Reason for Consultation/Follow-up: Establishing goals of care, Hospice Evaluation, Non pain symptom management, Pain control and Psychosocial/spiritual support  Subjective: - continued conversation regarding diagnosis, prognosis, GOC, disposition and options at bedside with two sons, Shanon Brow and Davis Gourd.  Family understand the limited prognosis   -Focus is comfort and dignity.    -Hopeful for hospice facility to support patient and her family/ husband  -emotional support to family  Length of Stay: 1  Current Medications: Scheduled Meds:  . antiseptic oral rinse  7 mL Mouth Rinse BID  . divalproex  125 mg Oral Q8H  .  HYDROmorphone (DILAUDID) injection  1 mg Intravenous Q4H  . LORazepam  1 mg Intravenous Q6H  . sodium chloride flush  3 mL Intravenous Q12H    Continuous Infusions:    PRN Meds: sodium chloride, albuterol, bisacodyl, ondansetron **OR** ondansetron (ZOFRAN) IV, sodium chloride flush  Physical Exam  Constitutional: She appears lethargic. She appears ill.  HENT:  Dry buccal membranes  Cardiovascular: Normal rate, regular rhythm and normal heart sounds.   Pulmonary/Chest: She has decreased breath sounds in the right lower field and the left lower field.  Abdominal: Soft. Normal appearance and bowel sounds are normal.  Neurological: She appears lethargic.  Skin: Skin is warm and dry.            Vital Signs: BP 90/45 mmHg  Pulse 74  Temp(Src) 98.6 F (37 C) (Oral)  Resp 18  Ht 5\' 5"  (1.651 m)  Wt 66.679 kg (147 lb)  BMI 24.46  kg/m2  SpO2 91%  LMP 06/08/1986 SpO2: SpO2: 91 % O2 Device: O2 Device: Not Delivered O2 Flow Rate:    Intake/output summary:  Intake/Output Summary (Last 24 hours) at 09/30/15 1028 Last data filed at 09/30/15 0900  Gross per 24 hour  Intake    590 ml  Output      0 ml  Net    590 ml   LBM: Last BM Date:  (unk) Baseline Weight: Weight: 66.679 kg (147 lb) Most recent weight: Weight: 66.679 kg (147 lb)  Palliative Assessment/Data:  20% at best    Flowsheet Rows        Most Recent Value   Intake Tab    Referral Department  -- [ED]   Unit at Time of Referral  ER   Palliative Care Primary Diagnosis  Sepsis/Infectious Disease   Date Notified  09/28/15   Palliative Care Type  New Palliative care   Reason for referral  Clarify Goals of Care   Date of Admission  09/28/15   Date first seen by Palliative Care  09/29/15   # of days Palliative referral response time  1 Day(s)   # of days IP prior to Palliative referral  0   Clinical Assessment    Psychosocial & Spiritual Assessment    Palliative Care Outcomes       Patient Active Problem List   Diagnosis Date Noted  . E coli bacteremia 09/30/2015  . Dementia 09/30/2015  . Thrombocytopenia ([place]) 09/30/2015  . Sepsis due to Enterobacter species ([place]) 09/30/2015  . Sepsis ([place]) 09/29/2015  . UTI (urinary tract infection) 09/29/2015  . DNR (do not resuscitate)   . Palliative care encounter   . Generalized abdominal pain   . Lymphedema of upper extremity 12/14/2014  . Postsurgical hypothyroidism 01/29/2014  . Depression 10/05/2013  . Anxiety 10/05/2013  . Essential tremor 08/18/2013  . Essential hypertension, benign 08/18/2013  . Hypothyroidism 08/18/2013  . Rheumatoid arthritis ([place]) 08/18/2013  . Pelvic fracture ([place]) 08/17/2013  . Personal history of malignant neoplasm of breast 11/05/2012  . Colon cancer ([place]) 11/05/2012  . Abnormality of gait 05/13/2012  . Intracerebral hemorrhage ([place]) 05/13/2012  .  Encounter for therapeutic drug monitoring 05/13/2012  . Essential and other specified forms of tremor 05/13/2012    Palliative Care Assessment & Plan   Patient Profile: 82 y.o. female with medical history significant for hypertension, hypothyroidism, rheumatoid arthritis, depression, anxiety, and dementia,   Admitted from her her facility, noted to be afebrile for the past 3 days and with worsening lethargy.   In  ED, patient is found- urinalysis features many bacteria, moderate leukocyte, positive nitrite, and 6-30 WBC, admitted to the hospital for ongoing evaluation and management of sepsis suspected secondary to UTI.  Focus of care is comfort   Recommendations/Plan:  Pain: Increase IV dilaudid to 1 mg IV every 4 hrs  Agitation:  Ativan 1 mg IV every 6 hrs  Goals of Care and Additional Recommendations:  Limitations on Scope of Treatment: Full Comfort Care  Code Status:    Code Status Orders        Start     Ordered   09/29/15 0150  Do not attempt resuscitation (DNR)   Continuous    Question Answer Comment  In the event of cardiac or respiratory ARREST Do not call a "code blue"   In the event of cardiac or respiratory ARREST Do not perform Intubation, CPR, defibrillation or ACLS   In the event of cardiac or respiratory ARREST Use medication by any route, position, wound care, and other measures to relive pain and suffering. May use oxygen, suction and manual treatment of airway obstruction as needed for comfort.      09/29/15 0152    Code Status History    Date Active Date Inactive Code Status Order ID Comments User Context   09/28/2015  7:27 PM 09/29/2015  1:52 AM DNR [id number]  Orlie Dakin, MD ED   08/24/2013 12:12 AM 08/26/2013  6:43 PM DNR [id number]  Theressa Millard,  MD ED   08/17/2013  9:52 PM 08/19/2013  4:46 PM DNR [id number]  Berle Mull, MD Inpatient    Advance Directive Documentation        Most Recent Value   Type of Advance Directive  Healthcare Power of  Attorney, Living will, Out of facility DNR (pink MOST or yellow form)   Pre-existing out of facility DNR order (yellow form or pink MOST form)     "MOST" Form in Place?         Prognosis:   < 2 weeks  Discharge Planning:  Hospice facility- will write for choice  Care plan was discussed with  Dr Myna Bright  Thank you for allowing the Palliative Medicine Team to assist in the care of this patient.   Time In: 0945 Time Out: 1045 Total Time 60 min Prolonged Time Billed  no       Greater than 50%  of this time was spent counseling and coordinating care related to the above assessment and plan.  Wadie Lessen, NP  Please contact Palliative Medicine Team phone at [phone] for questions and concerns.

## 2015-09-30 NOTE — Progress Notes (Signed)
Refused all p.o. medicines by keeping her mouth closed tightly.Palliative and primary M.D. made aware.

## 2015-09-30 NOTE — Consult Note (Signed)
[facility] [employer] [facility] Received request from [place] for family interest in [facility]. Chart reviewed and met with son Shanon Brow to complete paper work for transfer today. Dr. Orpah Melter to assume care per family preference.   RN please call report to [phone].  Please fax discharge summary to [phone].  Thank you.  Erling Conte, [place]

## 2015-09-30 NOTE — Clinical Social Work Note (Addendum)
Patient discharging to [facility] today, transported by ambulance. Patient's son Shanon Brow at the bedside and was made aware when transport arranged.  [name], MSW, LCSW Licensed Clinical Social Worker [place] [phone]

## 2015-10-01 LAB — CULTURE, BLOOD (ROUTINE X 2)

## 2015-10-06 ENCOUNTER — Telehealth: Payer: Self-pay | Admitting: Neurology

## 2015-10-06 NOTE — Telephone Encounter (Signed)
Patient's son is calling to advise that the patient passed away on Oct 13, 2015 at [facility]. He also wants to let us know that the hospital found a  metastatic lesion in the patient's spine. He wanted to thank you for the care his Mom was given at our office.

## 2015-10-06 NOTE — Telephone Encounter (Signed)
Events noted

## 2015-10-07 DEATH — deceased

## 2015-11-06 DEATH — deceased

## 2015-12-13 ENCOUNTER — Ambulatory Visit: Payer: Medicare Other | Admitting: Internal Medicine

## 2015-12-13 ENCOUNTER — Other Ambulatory Visit: Payer: Medicare Other

## 2015-12-13 ENCOUNTER — Ambulatory Visit: Payer: Medicare Other

## 2015-12-25 ENCOUNTER — Telehealth: Payer: Self-pay | Admitting: Internal Medicine

## 2015-12-25 NOTE — Telephone Encounter (Signed)
Patient's son Ayris Austerman called re patient's passing on 12/13/15. Message from Paramedic.

## 2016-03-28 ENCOUNTER — Ambulatory Visit: Payer: Medicare Other | Admitting: Neurology
